# Patient Record
Sex: Male | Born: 1935
Health system: Southern US, Community
[De-identification: ages and names within clinical notes are randomized; demographics above are authoritative.]

## PROBLEM LIST (undated history)

## (undated) ENCOUNTER — Emergency Department (HOSPITAL_COMMUNITY): Payer: Medicare Other | Source: Home / Self Care

## (undated) DIAGNOSIS — Z9889 Other specified postprocedural states: Secondary | ICD-10-CM

## (undated) DIAGNOSIS — F329 Major depressive disorder, single episode, unspecified: Secondary | ICD-10-CM

## (undated) DIAGNOSIS — F32A Depression, unspecified: Secondary | ICD-10-CM

## (undated) DIAGNOSIS — F419 Anxiety disorder, unspecified: Secondary | ICD-10-CM

## (undated) DIAGNOSIS — H9190 Unspecified hearing loss, unspecified ear: Secondary | ICD-10-CM

## (undated) DIAGNOSIS — J449 Chronic obstructive pulmonary disease, unspecified: Secondary | ICD-10-CM

## (undated) DIAGNOSIS — I10 Essential (primary) hypertension: Secondary | ICD-10-CM

## (undated) DIAGNOSIS — N400 Enlarged prostate without lower urinary tract symptoms: Secondary | ICD-10-CM

## (undated) DIAGNOSIS — K219 Gastro-esophageal reflux disease without esophagitis: Secondary | ICD-10-CM

## (undated) DIAGNOSIS — R112 Nausea with vomiting, unspecified: Secondary | ICD-10-CM

## (undated) DIAGNOSIS — M1990 Unspecified osteoarthritis, unspecified site: Secondary | ICD-10-CM

## (undated) HISTORY — PX: HERNIA REPAIR: SHX51

## (undated) HISTORY — PX: REVISION TOTAL HIP ARTHROPLASTY: SHX766

## (undated) HISTORY — PX: PARTIAL HIP ARTHROPLASTY: SHX733

## (undated) HISTORY — PX: COMPRESSION HIP SCREW: SHX1386

---

## 2004-02-08 ENCOUNTER — Emergency Department (HOSPITAL_COMMUNITY): Admission: EM | Admit: 2004-02-08 | Discharge: 2004-02-08 | Payer: Self-pay | Admitting: Emergency Medicine

## 2005-03-08 ENCOUNTER — Emergency Department (HOSPITAL_COMMUNITY): Admission: EM | Admit: 2005-03-08 | Discharge: 2005-03-08 | Payer: Self-pay | Admitting: Emergency Medicine

## 2005-03-22 ENCOUNTER — Ambulatory Visit: Payer: Self-pay | Admitting: Cardiology

## 2007-09-21 ENCOUNTER — Emergency Department (HOSPITAL_COMMUNITY): Admission: EM | Admit: 2007-09-21 | Discharge: 2007-09-21 | Payer: Self-pay | Admitting: Emergency Medicine

## 2008-08-09 ENCOUNTER — Ambulatory Visit (HOSPITAL_COMMUNITY): Admission: RE | Admit: 2008-08-09 | Discharge: 2008-08-09 | Payer: Self-pay | Admitting: Occupational Medicine

## 2009-09-22 ENCOUNTER — Ambulatory Visit (HOSPITAL_COMMUNITY): Admission: RE | Admit: 2009-09-22 | Discharge: 2009-09-22 | Payer: Self-pay | Admitting: Infectious Diseases

## 2011-07-08 ENCOUNTER — Other Ambulatory Visit: Payer: Self-pay

## 2011-07-08 ENCOUNTER — Encounter (HOSPITAL_COMMUNITY): Payer: Self-pay

## 2011-07-08 ENCOUNTER — Encounter (HOSPITAL_COMMUNITY)
Admission: RE | Admit: 2011-07-08 | Discharge: 2011-07-08 | Disposition: A | Discharge status: Home / Self Care | Payer: 59 | Source: Ambulatory Visit | Attending: Urology | Admitting: Urology

## 2011-07-08 HISTORY — DX: Essential (primary) hypertension: I10

## 2011-07-08 HISTORY — DX: Chronic obstructive pulmonary disease, unspecified: J44.9

## 2011-07-08 HISTORY — DX: Unspecified hearing loss, unspecified ear: H91.90

## 2011-07-08 HISTORY — DX: Gastro-esophageal reflux disease without esophagitis: K21.9

## 2011-07-08 HISTORY — DX: Major depressive disorder, single episode, unspecified: F32.9

## 2011-07-08 HISTORY — DX: Nausea with vomiting, unspecified: Z98.890

## 2011-07-08 HISTORY — DX: Unspecified osteoarthritis, unspecified site: M19.90

## 2011-07-08 HISTORY — DX: Anxiety disorder, unspecified: F41.9

## 2011-07-08 HISTORY — DX: Benign prostatic hyperplasia without lower urinary tract symptoms: N40.0

## 2011-07-08 HISTORY — DX: Depression, unspecified: F32.A

## 2011-07-08 HISTORY — DX: Nausea with vomiting, unspecified: R11.2

## 2011-07-08 LAB — BASIC METABOLIC PANEL
BUN: 15 mg/dL (ref 6–23)
Calcium: 9.1 mg/dL (ref 8.4–10.5)
GFR calc Af Amer: >60 mL/min (ref >60)

## 2011-07-08 LAB — CBC
MCV: 86.5 fL (ref 78.0–100.0)
RBC: 4.31 MIL/uL (ref 4.22–5.81)

## 2011-07-08 NOTE — Patient Instructions (Addendum)
20 BILLYJACK TROMPETER  07/08/2011   Your procedure is scheduled on:  07/09/2011  Report to [facility] at  940  AM.  Call this number if you have problems the morning of surgery: [phone]   Remember:   Do not eat food:After Midnight.  Do not drink clear liquids: After Midnight.  Take these medicines the morning of surgery with A SIP OF WATER: none   Do not wear jewelry, make-up or nail polish.  Do not wear lotions, powders, or perfumes. You may wear deodorant.  Do not shave 48 hours prior to surgery.  Do not bring valuables to the hospital.  Contacts, dentures or bridgework may not be worn into surgery.  Leave suitcase in the car. After surgery it may be brought to your room.  For patients admitted to the hospital, checkout time is 11:00 AM the day of discharge.   Patients discharged the day of surgery will not be allowed to drive home.  Name and phone number of your driver: family  Special Instructions: CHG Shower Use Special Wash: 1/2 bottle night before surgery and 1/2 bottle morning of surgery.   Please read over the following fact sheets that you were given: Pain Booklet, MRSA Information, Surgical Site Infection Prevention, Anesthesia Post-op Instructions and Care and Recovery After Surgery PATIENT INSTRUCTIONS POST-ANESTHESIA  IMMEDIATELY FOLLOWING SURGERY:  Do not drive or operate machinery for the first twenty four hours after surgery.  Do not make any important decisions for twenty four hours after surgery or while taking narcotic pain medications or sedatives.  If you develop intractable nausea and vomiting or a severe headache please notify your doctor immediately.  FOLLOW-UP:  Please make an appointment with your surgeon as instructed. You do not need to follow up with anesthesia unless specifically instructed to do so.  WOUND CARE INSTRUCTIONS (if applicable):  Keep a dry clean dressing on the anesthesia/puncture wound site if there is drainage.  Once the wound has quit draining  you may leave it open to air.  Generally you should leave the bandage intact for twenty four hours unless there is drainage.  If the epidural site drains for more than 36-48 hours please call the anesthesia department.  QUESTIONS?:  Please feel free to call your physician or the hospital operator if you have any questions, and they will be happy to assist you.     [facility] Anesthesia Department [address] [place] [phone]

## 2011-07-08 NOTE — H&P (Signed)
NAMEDICK, HARK                 ACCOUNT NO.:  [id number]  MEDICAL RECORD NO.:  [id number]  LOCATION:                                FACILITY:  [facility]  PHYSICIAN:  Ky Barban, M.D.DATE OF BIRTH:  08/07/1935  DATE OF ADMISSION:  07/08/2011 DATE OF DISCHARGE:  LH                             HISTORY & PHYSICAL   CHIEF COMPLAINT:  Symptoms of prostatism.  HISTORY:  Christian Miller is a 75 year old gentleman well-known to me.  I am following him since 1993 and initially, he was suffering from chronic prostatitis.  At that time, he was found to have urethral stricture which I have dilated.  He has not any recurrence of that stricture. Then in 2003, he was having symptoms of prostatism.  Workup showed that he has enlarged prostate.  He underwent holmium laser ablation of the prostate.  He has done well with a urinary stream but he continues to have urgency, frequency, and lately, however, last month he developed an episode of urinary tract infection and gross hematuria.  He developed sepsis and was admitted to [facility] and treated and then I cystoscoped again this time in the office last week.  He was found to have regrowth of his adenoma causing bladder neck obstruction.  His residual urine was 175 mL, so I have recommended that we go ahead and resect his prostate for which he is coming as outpatient.  He also has developed erectile dysfunction, but he does okay with Viagra.  His other problem is that he had an elevated PSA in 2007, underwent prostate biopsy, and there was one biopsy that showed suspicion of adenocarcinoma but subsequently it was completely clear.  There is no cancer in his prostate.  PAST MEDICAL HISTORY:  He denies any chest pain, orthopnea, PND, nausea, or vomiting.  No history of hypertension or diabetes.  PERSONAL HISTORY:  He does not smoke or drink.  He is married and lives with his wife.  REVIEW OF SYSTEMS:  Denies any chest pain, orthopnea,  PND, nausea, or vomiting.  PHYSICAL EXAMINATION:  GENERAL:  Moderately built, not in acute distress, fully conscious, alert, and oriented. VITAL SIGNS:  Blood pressure 130/80, temperature is normal. CENTRAL NERVOUS SYSTEM:  No gross neurological deficit. HEAD/NECK/ENT:  Negative. CHEST:  Symmetrical. HEART:  Regular sinus rhythm.  No murmur. ABDOMEN:  Soft, flat.  Liver, spleen, kidneys not palpable. BACK:  No CVA tenderness. EXTERNAL GENITALIA:  He has circumcised meatus and adequate.  Testicles are normal. RECTAL:  Prostate 1.5+ smooth and firm.  IMPRESSION: 1. Benign prostatic hypertrophy with bladder neck obstruction. 2. Depression. 3. Gastroesophageal reflux. 4. Osteoarthritis. 5. Diverticulosis. 6. He looks like he also has hypertension, although several blood     pressures I have taken in the office look normal.  PLAN:  TUR prostate under anesthesia as outpatient.  Procedure limitation and complication discussed.     Ky Barban, M.D.     MIJ/MEDQ  D:  07/08/2011  T:  07/08/2011  Job:  [id number]

## 2011-07-09 ENCOUNTER — Encounter (HOSPITAL_COMMUNITY)
Admission: RE | Disposition: A | Discharge status: Home / Self Care | Payer: Self-pay | Source: Ambulatory Visit | Attending: Urology

## 2011-07-09 ENCOUNTER — Other Ambulatory Visit: Payer: Self-pay | Admitting: Urology

## 2011-07-09 ENCOUNTER — Encounter (HOSPITAL_COMMUNITY): Payer: Self-pay | Admitting: Anesthesiology

## 2011-07-09 ENCOUNTER — Ambulatory Visit (HOSPITAL_COMMUNITY): Payer: 59 | Admitting: Anesthesiology

## 2011-07-09 ENCOUNTER — Ambulatory Visit (HOSPITAL_COMMUNITY)
Admission: RE | Admit: 2011-07-09 | Discharge: 2011-07-10 | Disposition: A | Discharge status: Home / Self Care | Payer: 59 | Source: Ambulatory Visit | Attending: Urology | Admitting: Urology

## 2011-07-09 ENCOUNTER — Encounter (HOSPITAL_COMMUNITY): Payer: Self-pay | Admitting: *Deleted

## 2011-07-09 DIAGNOSIS — J449 Chronic obstructive pulmonary disease, unspecified: Secondary | ICD-10-CM | POA: Insufficient documentation

## 2011-07-09 DIAGNOSIS — N400 Enlarged prostate without lower urinary tract symptoms: Secondary | ICD-10-CM | POA: Insufficient documentation

## 2011-07-09 DIAGNOSIS — I10 Essential (primary) hypertension: Secondary | ICD-10-CM | POA: Insufficient documentation

## 2011-07-09 DIAGNOSIS — Z01812 Encounter for preprocedural laboratory examination: Secondary | ICD-10-CM | POA: Insufficient documentation

## 2011-07-09 DIAGNOSIS — Z01810 Encounter for preprocedural cardiovascular examination: Secondary | ICD-10-CM | POA: Insufficient documentation

## 2011-07-09 DIAGNOSIS — J4489 Other specified chronic obstructive pulmonary disease: Secondary | ICD-10-CM | POA: Insufficient documentation

## 2011-07-09 HISTORY — PX: TRANSURETHRAL RESECTION OF PROSTATE: SHX73

## 2011-07-09 SURGERY — TURP (TRANSURETHRAL RESECTION OF PROSTATE)
Anesthesia: Spinal | Wound class: Clean Contaminated

## 2011-07-09 MED ORDER — FENTANYL CITRATE 0.05 MG/ML IJ SOLN
INTRAMUSCULAR | Status: DC | PRN
  Administered 2011-07-09: 25 ug via INTRATHECAL

## 2011-07-09 MED ORDER — FENTANYL CITRATE 0.05 MG/ML IJ SOLN
INTRAMUSCULAR | Status: DC | PRN
  Administered 2011-07-09: 25 ug via INTRAVENOUS
  Administered 2011-07-09: 50 ug via INTRAVENOUS

## 2011-07-09 MED ORDER — EPHEDRINE SULFATE 50 MG/ML IJ SOLN
INTRAMUSCULAR | Status: AC
  Filled 2011-07-09: qty 1

## 2011-07-09 MED ORDER — CIPROFLOXACIN IN D5W 200 MG/100ML IV SOLN
200.0000 mg | INTRAVENOUS | Status: AC
  Administered 2011-07-10: 200 mg via INTRAVENOUS
  Filled 2011-07-09: qty 100

## 2011-07-09 MED ORDER — LIDOCAINE HCL (PF) 1 % IJ SOLN
INTRAMUSCULAR | Status: AC
  Filled 2011-07-09: qty 5

## 2011-07-09 MED ORDER — MIDAZOLAM HCL 2 MG/2ML IJ SOLN
1.0000 mg | INTRAMUSCULAR | Status: DC | PRN
  Administered 2011-07-09: 2 mg via INTRAVENOUS

## 2011-07-09 MED ORDER — DIAZEPAM 5 MG PO TABS
5.0000 mg | ORAL_TABLET | Freq: Two times a day (BID) | ORAL | Status: DC
  Administered 2011-07-09 – 2011-07-10 (×2): 5 mg via ORAL
  Filled 2011-07-09 (×3): qty 1

## 2011-07-09 MED ORDER — SERTRALINE HCL 50 MG PO TABS
100.0000 mg | ORAL_TABLET | Freq: Every day | ORAL | Status: DC
  Filled 2011-07-09: qty 2

## 2011-07-09 MED ORDER — LACTATED RINGERS IV SOLN
INTRAVENOUS | Status: DC
  Administered 2011-07-09: 11:00:00 via INTRAVENOUS
  Administered 2011-07-09: 50 mL/h via INTRAVENOUS

## 2011-07-09 MED ORDER — PROPOFOL 10 MG/ML IV EMUL
INTRAVENOUS | Status: DC | PRN
  Administered 2011-07-09: 35 ug/kg/min via INTRAVENOUS

## 2011-07-09 MED ORDER — SERTRALINE HCL 50 MG PO TABS
100.0000 mg | ORAL_TABLET | Freq: Every day | ORAL | Status: DC
  Administered 2011-07-09: 100 mg via ORAL
  Filled 2011-07-09: qty 2

## 2011-07-09 MED ORDER — METOCLOPRAMIDE HCL 5 MG/ML IJ SOLN
INTRAMUSCULAR | Status: DC | PRN
  Administered 2011-07-09: 10 mg via INTRAVENOUS

## 2011-07-09 MED ORDER — BUPIVACAINE HCL 0.75 % IJ SOLN
INTRAMUSCULAR | Status: DC | PRN
  Administered 2011-07-09: 12 mg via INTRATHECAL

## 2011-07-09 MED ORDER — FENTANYL CITRATE 0.05 MG/ML IJ SOLN: 25.0000 ug | INTRAMUSCULAR | Status: DC | PRN

## 2011-07-09 MED ORDER — THERA M PLUS PO TABS
1.0000 | ORAL_TABLET | Freq: Every day | ORAL | Status: DC
  Administered 2011-07-09 – 2011-07-10 (×2): 1 via ORAL
  Filled 2011-07-09 (×2): qty 1

## 2011-07-09 MED ORDER — MIDAZOLAM HCL 5 MG/5ML IJ SOLN
INTRAMUSCULAR | Status: DC | PRN
  Administered 2011-07-09: 2 mg via INTRAVENOUS

## 2011-07-09 MED ORDER — DEXTROSE-NACL 5-0.45 % IV SOLN
INTRAVENOUS | Status: DC
  Administered 2011-07-09 – 2011-07-10 (×3): via INTRAVENOUS

## 2011-07-09 MED ORDER — FENTANYL CITRATE 0.05 MG/ML IJ SOLN
INTRAMUSCULAR | Status: AC
  Filled 2011-07-09: qty 2

## 2011-07-09 MED ORDER — CIPROFLOXACIN IN D5W 200 MG/100ML IV SOLN
INTRAVENOUS | Status: AC
  Administered 2011-07-09: 200 mg via INTRAVENOUS
  Filled 2011-07-09: qty 100

## 2011-07-09 MED ORDER — GLYCINE 1.5 % IR SOLN
Status: DC | PRN
  Administered 2011-07-09: 3000 mL

## 2011-07-09 MED ORDER — ACETAMINOPHEN 500 MG PO TABS: 500.0000 mg | ORAL_TABLET | Freq: Four times a day (QID) | ORAL | Status: DC | PRN

## 2011-07-09 MED ORDER — LOSARTAN POTASSIUM 50 MG PO TABS
100.0000 mg | ORAL_TABLET | Freq: Every day | ORAL | Status: DC
  Administered 2011-07-10: 100 mg via ORAL
  Filled 2011-07-09 (×2): qty 2

## 2011-07-09 MED ORDER — MIDAZOLAM HCL 2 MG/2ML IJ SOLN
INTRAMUSCULAR | Status: AC
  Filled 2011-07-09: qty 2

## 2011-07-09 MED ORDER — PROPOFOL 10 MG/ML IV EMUL
INTRAVENOUS | Status: AC
  Filled 2011-07-09: qty 20

## 2011-07-09 MED ORDER — BUPIVACAINE IN DEXTROSE 0.75-8.25 % IT SOLN
INTRATHECAL | Status: AC
  Filled 2011-07-09: qty 2

## 2011-07-09 MED ORDER — STERILE WATER FOR IRRIGATION IR SOLN
Status: DC | PRN
  Administered 2011-07-09: 1000 mL

## 2011-07-09 MED ORDER — METOCLOPRAMIDE HCL 5 MG/ML IJ SOLN
INTRAMUSCULAR | Status: AC
  Filled 2011-07-09: qty 2

## 2011-07-09 MED ORDER — PANTOPRAZOLE SODIUM 40 MG PO TBEC
40.0000 mg | DELAYED_RELEASE_TABLET | Freq: Every day | ORAL | Status: DC
  Administered 2011-07-10: 40 mg via ORAL
  Filled 2011-07-09 (×2): qty 1

## 2011-07-09 MED ORDER — MORPHINE SULFATE 2 MG/ML IJ SOLN: 2.0000 mg | INTRAMUSCULAR | Status: DC | PRN

## 2011-07-09 MED ORDER — ONDANSETRON HCL 4 MG/2ML IJ SOLN: 4.0000 mg | Freq: Once | INTRAMUSCULAR | Status: DC | PRN

## 2011-07-09 SURGICAL SUPPLY — 33 items
BAG DECANTER FOR FLEXI CONT (MISCELLANEOUS) ×2 IMPLANT
BAG DRAIN URO TABLE W/ADPT NS (DRAPE) ×2 IMPLANT
BAG URINE DRAIN TURP 4L (OSTOMY) ×2 IMPLANT
CABLE HI FREQUENCY MONOPOLAR (ELECTROSURGICAL) ×2 IMPLANT
CATH FOLEY 3WAY 30CC 22F (CATHETERS) ×2 IMPLANT
CLOTH BEACON ORANGE TIMEOUT ST (SAFETY) ×2 IMPLANT
CONNECTOR 5 IN 1 STRAIGHT STRL (MISCELLANEOUS) ×2 IMPLANT
DRAPE STERI URO 23X35 APER SZ5 (DRAPE) ×2 IMPLANT
ELECT CUT LOOP C-MAX 27FR .012 (CUTTING LOOP)
ELECT REM PT RETURN 9FT ADLT (ELECTROSURGICAL) ×2
ELECTRODE CUT LP CMX 27FR .012 (CUTTING LOOP) IMPLANT
ELECTRODE REM PT RTRN 9FT ADLT (ELECTROSURGICAL) ×1 IMPLANT
FLOOR PAD 36X40 (MISCELLANEOUS)
FORMALIN 10 PREFIL 480ML (MISCELLANEOUS) ×2 IMPLANT
GLOVE BIO SURGEON STRL SZ7 (GLOVE) ×2 IMPLANT
GLOVE BIOGEL PI IND STRL 7.5 (GLOVE) ×1 IMPLANT
GLOVE BIOGEL PI INDICATOR 7.5 (GLOVE) ×1
GLOVE ECLIPSE 7.0 STRL STRAW (GLOVE) ×2 IMPLANT
GLOVE EXAM NITRILE MD LF STRL (GLOVE) ×2 IMPLANT
GLYCINE 1.5% IRRIG UROMATIC (IV SOLUTION) ×12 IMPLANT
GOWN BRE IMP SLV AUR XL STRL (GOWN DISPOSABLE) ×2 IMPLANT
IV NS IRRIG 3000ML ARTHROMATIC (IV SOLUTION) IMPLANT
KIT ROOM TURNOVER AP CYSTO (KITS) ×2 IMPLANT
MANIFOLD NEPTUNE II (INSTRUMENTS) ×2 IMPLANT
PACK CYSTO (CUSTOM PROCEDURE TRAY) ×2 IMPLANT
PAD ARMBOARD 7.5X6 YLW CONV (MISCELLANEOUS) ×2 IMPLANT
PAD FLOOR 36X40 (MISCELLANEOUS) IMPLANT
SET IRRIGATING DISP (SET/KITS/TRAYS/PACK) ×2 IMPLANT
SYR 30ML LL (SYRINGE) ×2 IMPLANT
TOWEL OR 17X26 4PK STRL BLUE (TOWEL DISPOSABLE) ×2 IMPLANT
WATER STERILE IRR 1000ML POUR (IV SOLUTION) ×2 IMPLANT
XPEEDA 550 SIDEFIRING FIBER (MISCELLANEOUS) IMPLANT
YANKAUER SUCT BULB TIP 10FT TU (MISCELLANEOUS) ×2 IMPLANT

## 2011-07-09 NOTE — Transfer of Care (Signed)
Immediate Anesthesia Transfer of Care Note  Patient: Christian Miller  Procedure(s) Performed:  TRANSURETHRAL RESECTION OF THE PROSTATE (TURP)  Patient Location: PACU  Anesthesia Type: Spinal  Level of Consciousness: awake, alert  and oriented  Airway & Oxygen Therapy: Patient Spontanous Breathing and Patient connected to nasal cannula oxygen  Post-op Assessment: Report given to PACU RN, Post -op Vital signs reviewed and stable and Patient moving all extremities  Post vital signs: Reviewed and stable  Complications: No apparent anesthesia complications   AND MOVING LEGS WELL. COMFORTABLE> HOB ^ FOR REFLUX S+Sx

## 2011-07-09 NOTE — Anesthesia Preprocedure Evaluation (Addendum)
Anesthesia Evaluation  Name, MR# and DOB Patient awake  General Assessment Comment  Reviewed: Allergy & Precautions, H&P , NPO status , Patient's Chart, lab work & pertinent test results  History of Anesthesia Complications (+) PONV  Airway Mallampati: II TM Distance: >3 FB     Dental  (+) Partial Upper and Implants   Pulmonary   COPD inhaler    pulmonary exam normalPulmonary Exam Normal     Cardiovascular hypertension, Pt. on medications Regular Normal    Neuro/Psych    (+) Anxiety, Depression,    GI/Hepatic/Renal        GERD Medicated and Controlled     Endo/Other    Abdominal   Musculoskeletal   Hematology   Peds  Reproductive/Obstetrics    Anesthesia Other Findings            Anesthesia Physical Anesthesia Plan  ASA: III  Anesthesia Plan: Spinal   Post-op Pain Management:    Induction: Intravenous  Airway Management Planned: Nasal Cannula  Additional Equipment:   Intra-op Plan:   Post-operative Plan:   Informed Consent: I have reviewed the patients History and Physical, chart, labs and discussed the procedure including the risks, benefits and alternatives for the proposed anesthesia with the patient or authorized representative who has indicated his/her understanding and acceptance.     Plan Discussed with:   Anesthesia Plan Comments:         Anesthesia Quick Evaluation

## 2011-07-09 NOTE — Brief Op Note (Signed)
No change in  H& P

## 2011-07-09 NOTE — Op Note (Signed)
NAMECAETANO, OBERHAUS                 ACCOUNT NO.:  [id number]  MEDICAL RECORD NO.:  [id number]  LOCATION:  [facility]                          FACILITY:  [facility]  PHYSICIAN:  Ky Barban, M.D.DATE OF BIRTH:  12/20/1934  DATE OF PROCEDURE:  07/09/2011 DATE OF DISCHARGE:                              OPERATIVE REPORT   PREOPERATIVE DIAGNOSIS:  Benign prostatic hypertrophy.  POSTOPERATIVE DIAGNOSIS:  Benign prostatic hypertrophy.  PROCEDURE:  TUR prostate.  ANESTHESIA:  Spinal.  PROCEDURE:  The patient under spinal anesthesia in lithotomy position and usual prep and drape, #28 Iglesias resectoscope was introduced into the bladder, was inspected again.  He has very large left lobe, which was going across the midline.  It was resected completely to the level of the verumontanum and there was some tissue in the right lobe, which was resected next. After resecting the left lobe and obstructive tissue of the right lobe, the chips were evacuated.  Bleeders were coagulated. At this point, the prostatic urethra looks wide open and resectoscope was removed.  A 22 three-way Foley catheter left in for drainage.  CBI started which is clear.  The patient left the operating room in satisfactory condition.     Ky Barban, M.D.     MIJ/MEDQ  D:  07/09/2011  T:  07/09/2011  Job:  [id number]

## 2011-07-09 NOTE — Anesthesia Procedure Notes (Addendum)
Spinal Block  Patient location during procedure: OR Start time: 07/09/2011 10:19 AM Staffing CRNA/Resident: Marylene Buerger Preanesthetic Checklist Completed: patient identified and IV checked Spinal Block Patient position: left lateral decubitus Prep: Betadine  Spinal Block  End time: 07/09/2011 10:26 AM Spinal Block Patient position: left lateral decubitus Prep: Betadine Patient monitoring: heart rate Approach: left paramedian Location: L3-4 Injection technique: single-shot Needle Needle type: Spinocan  Needle gauge: 22 G Needle length: 9 cm Assessment Sensory level: T6 Additional Notes [id number] 2012  -  10 Marcaine  12Mg  Fentanyl  25  Mcg

## 2011-07-09 NOTE — Progress Notes (Addendum)
I stat performed per MD order. Pt's previous lab work showed K+ 5.4. Now within normal limits @ 4.4 (K+)

## 2011-07-09 NOTE — Op Note (Signed)
[phone] [id number]

## 2011-07-10 LAB — BASIC METABOLIC PANEL
CO2: 28 mEq/L (ref 19–32)
Calcium: 8.6 mg/dL (ref 8.4–10.5)
GFR calc Af Amer: >60 mL/min (ref >60)
GFR calc non Af Amer: >60 mL/min (ref >60)
Sodium: 137 mEq/L (ref 135–145)

## 2011-07-10 LAB — CBC
Platelets: 199 10*3/uL (ref 150–400)
RBC: 4.05 MIL/uL — ABNORMAL LOW (ref 4.22–5.81)
WBC: 10.3 10*3/uL (ref 4.0–10.5)

## 2011-07-10 LAB — POCT I-STAT 4, (NA,K, GLUC, HGB,HCT)
HCT: 40 % (ref 39.0–52.0)
Hemoglobin: 13.6 g/dL (ref 13.0–17.0)

## 2011-07-10 LAB — URINE CULTURE
Colony Count: NO GROWTH
Culture  Setup Time: 201209251340

## 2011-07-10 NOTE — Anesthesia Postprocedure Evaluation (Signed)
  Anesthesia Post-op Note  Patient: Christian Miller  Procedure(s) Performed:  TRANSURETHRAL RESECTION OF THE PROSTATE (TURP)  Patient Location:  Room 300  Anesthesia Type: Spinal  Level of Consciousness: awake, alert  and oriented  Airway and Oxygen Therapy: Patient Spontanous Breathing  Post-op Pain: none  Post-op Assessment: Post-op Vital signs reviewed, Patient's Cardiovascular Status Stable and Respiratory Function Stable  Post-op Vital Signs: Reviewed and stable  Complications: No apparent anesthesia complications

## 2011-07-12 ENCOUNTER — Encounter (HOSPITAL_COMMUNITY): Payer: Self-pay | Admitting: Urology

## 2011-07-22 LAB — URINALYSIS, ROUTINE W REFLEX MICROSCOPIC
Nitrite: NEGATIVE
Specific Gravity, Urine: 1.01
Urobilinogen, UA: 0.2

## 2011-07-22 LAB — DIFFERENTIAL
Eosinophils Absolute: 0.4
Eosinophils Relative: 3
Lymphs Abs: 1.7
Monocytes Absolute: 0.8

## 2011-07-22 LAB — COMPREHENSIVE METABOLIC PANEL
ALT: 14
AST: 18
Albumin: 3.8
CO2: 30
Calcium: 9
Chloride: 104
GFR calc Af Amer: >60
GFR calc non Af Amer: >60
Sodium: 143
Total Bilirubin: 0.3

## 2011-07-22 LAB — CBC
RBC: 4.68
WBC: 11.9 — ABNORMAL HIGH

## 2011-10-29 NOTE — Discharge Summary (Signed)
[phone]

## 2011-10-30 NOTE — Discharge Summary (Signed)
NAME:  Christian Miller, Christian Miller NO.:  [id number]  MEDICAL RECORD NO.:  [id number]  LOCATION:                                 FACILITY:  PHYSICIAN:  Ky Barban, M.D.DATE OF BIRTH:  08-Dec-1934  DATE OF ADMISSION: DATE OF DISCHARGE:  LH                              DISCHARGE SUMMARY   This 77 year old gentleman that is well known to me and he is being followed since 1993.  He was suffering from chronic prostatitis.  Also found to have a urethral stricture which I have dilated in the past. Recently, he had positive urine culture.  He was sick, found to have urinary tract infection, with enlarged prostate.  In the past, I have done holmium laser ablation of the prostate, but he has regrowth of the adenoma.  His main complaint his urgency, frequency, and he developed urinary tract infection with gross hematuria, was admitted in [facility] by family physician.  He comes back here and he has large residual urine, that is 175 mL.  I have recommended that he undergo TUR prostate for which he was brought as outpatient, after having routine workup CBC, urinalysis, BMET is normal, and as outpatient I did a TUR prostate.  I also want to mention that he had elevated PSA in 2007, underwent prostate biopsy which was negative.  On exam, it showed suspicion of adenocarcinoma and basically I got the impression that he does not have any prostate cancer.  He has no history of diabetes or hypertension.  He was admitted in the hospital, TUR prostate.  After doing TUR prostate and next day his urine is clear so I decided to send him home with a Foley catheter.  He is up and walking around, and his pathology report is pending.  On that day, the day of discharge and I will see him back in the office on Monday, so I can take the catheter out.  FINAL DISCHARGE DIAGNOSIS:  Benign prostatic hypertrophy.  DISCHARGE CONDITION:  Improved.  DISCHARGE MEDICATIONS:  He is advised to  continue his usual medicine.  I will see him back in office on Monday.     Ky Barban, M.D.     MIJ/MEDQ  D:  10/29/2011  T:  10/30/2011  Job:  [id number]

## 2012-12-06 ENCOUNTER — Other Ambulatory Visit: Payer: Self-pay

## 2012-12-06 ENCOUNTER — Observation Stay (HOSPITAL_COMMUNITY)
Admission: EM | Admit: 2012-12-06 | Discharge: 2012-12-07 | Disposition: A | Discharge status: Home / Self Care | Payer: 59 | Attending: Internal Medicine | Admitting: Internal Medicine

## 2012-12-06 ENCOUNTER — Emergency Department (HOSPITAL_COMMUNITY): Payer: 59

## 2012-12-06 ENCOUNTER — Encounter (HOSPITAL_COMMUNITY): Payer: Self-pay

## 2012-12-06 ENCOUNTER — Observation Stay (HOSPITAL_COMMUNITY): Payer: 59

## 2012-12-06 DIAGNOSIS — E785 Hyperlipidemia, unspecified: Secondary | ICD-10-CM | POA: Insufficient documentation

## 2012-12-06 DIAGNOSIS — R001 Bradycardia, unspecified: Secondary | ICD-10-CM

## 2012-12-06 DIAGNOSIS — R42 Dizziness and giddiness: Secondary | ICD-10-CM

## 2012-12-06 DIAGNOSIS — E871 Hypo-osmolality and hyponatremia: Secondary | ICD-10-CM

## 2012-12-06 DIAGNOSIS — I10 Essential (primary) hypertension: Secondary | ICD-10-CM

## 2012-12-06 DIAGNOSIS — J069 Acute upper respiratory infection, unspecified: Secondary | ICD-10-CM

## 2012-12-06 DIAGNOSIS — R55 Syncope and collapse: Principal | ICD-10-CM

## 2012-12-06 DIAGNOSIS — E860 Dehydration: Secondary | ICD-10-CM

## 2012-12-06 DIAGNOSIS — I498 Other specified cardiac arrhythmias: Secondary | ICD-10-CM | POA: Insufficient documentation

## 2012-12-06 LAB — DIFFERENTIAL
Basophils Absolute: 0 10*3/uL (ref 0.0–0.1)
Basophils Relative: 0 % (ref 0–1)
Eosinophils Absolute: 0.3 10*3/uL (ref 0.0–0.7)
Eosinophils Relative: 3 % (ref 0–5)
Lymphocytes Relative: 17 % (ref 12–46)
Monocytes Absolute: 1.4 10*3/uL — ABNORMAL HIGH (ref 0.1–1.0)

## 2012-12-06 LAB — TSH: TSH: 5.06 u[IU]/mL — ABNORMAL HIGH (ref 0.350–4.500)

## 2012-12-06 LAB — COMPREHENSIVE METABOLIC PANEL
ALT: 13 U/L (ref 0–53)
AST: 20 U/L (ref 0–37)
Albumin: 3.9 g/dL (ref 3.5–5.2)
Calcium: 9.5 mg/dL (ref 8.4–10.5)
Creatinine, Ser: 0.98 mg/dL (ref 0.50–1.35)
Sodium: 130 mEq/L — ABNORMAL LOW (ref 135–145)
Total Protein: 7.5 g/dL (ref 6.0–8.3)

## 2012-12-06 LAB — CBC
MCH: 30.1 pg (ref 26.0–34.0)
MCHC: 33.4 g/dL (ref 30.0–36.0)
MCV: 90.2 fL (ref 78.0–100.0)
Platelets: 236 10*3/uL (ref 150–400)
RDW: 13.1 % (ref 11.5–15.5)
WBC: 10.1 10*3/uL (ref 4.0–10.5)

## 2012-12-06 LAB — URINALYSIS, ROUTINE W REFLEX MICROSCOPIC
Bilirubin Urine: NEGATIVE
Hgb urine dipstick: NEGATIVE
Specific Gravity, Urine: 1.01 (ref 1.005–1.030)
Urobilinogen, UA: 0.2 mg/dL (ref 0.0–1.0)
pH: 6.5 (ref 5.0–8.0)

## 2012-12-06 LAB — MAGNESIUM: Magnesium: 2.1 mg/dL (ref 1.5–2.5)

## 2012-12-06 MED ORDER — ONDANSETRON HCL 4 MG PO TABS: 4.0000 mg | ORAL_TABLET | Freq: Four times a day (QID) | ORAL | Status: DC | PRN

## 2012-12-06 MED ORDER — DIAZEPAM 5 MG PO TABS
2.5000 mg | ORAL_TABLET | Freq: Two times a day (BID) | ORAL | Status: DC
  Administered 2012-12-06 – 2012-12-07 (×3): 2.5 mg via ORAL
  Filled 2012-12-06 (×3): qty 1

## 2012-12-06 MED ORDER — LOSARTAN POTASSIUM 50 MG PO TABS
100.0000 mg | ORAL_TABLET | Freq: Every day | ORAL | Status: DC
  Administered 2012-12-06 – 2012-12-07 (×2): 100 mg via ORAL
  Filled 2012-12-06 (×2): qty 2

## 2012-12-06 MED ORDER — ASPIRIN EC 81 MG PO TBEC
81.0000 mg | DELAYED_RELEASE_TABLET | ORAL | Status: DC
  Administered 2012-12-06: 81 mg via ORAL
  Filled 2012-12-06: qty 1

## 2012-12-06 MED ORDER — ONDANSETRON HCL 4 MG/2ML IJ SOLN: 4.0000 mg | Freq: Four times a day (QID) | INTRAMUSCULAR | Status: DC | PRN

## 2012-12-06 MED ORDER — OXYMETAZOLINE HCL 0.05 % NA SOLN: 1.0000 | Freq: Two times a day (BID) | NASAL | Status: DC | PRN

## 2012-12-06 MED ORDER — HYDROCODONE-ACETAMINOPHEN 5-325 MG PO TABS: 1.0000 | ORAL_TABLET | ORAL | Status: DC | PRN

## 2012-12-06 MED ORDER — ACETAMINOPHEN 325 MG PO TABS: 650.0000 mg | ORAL_TABLET | Freq: Four times a day (QID) | ORAL | Status: DC | PRN

## 2012-12-06 MED ORDER — ALBUTEROL SULFATE (5 MG/ML) 0.5% IN NEBU: 2.5000 mg | INHALATION_SOLUTION | RESPIRATORY_TRACT | Status: DC | PRN

## 2012-12-06 MED ORDER — SALINE SPRAY 0.65 % NA SOLN
1.0000 | NASAL | Status: DC | PRN
  Filled 2012-12-06: qty 44

## 2012-12-06 MED ORDER — SODIUM CHLORIDE 0.9 % IV SOLN
INTRAVENOUS | Status: DC
  Administered 2012-12-06 – 2012-12-07 (×2): via INTRAVENOUS

## 2012-12-06 MED ORDER — POTASSIUM CHLORIDE IN NACL 20-0.9 MEQ/L-% IV SOLN
INTRAVENOUS | Status: DC
  Administered 2012-12-06: 11:00:00 via INTRAVENOUS

## 2012-12-06 MED ORDER — ACETAMINOPHEN 650 MG RE SUPP: 650.0000 mg | Freq: Four times a day (QID) | RECTAL | Status: DC | PRN

## 2012-12-06 MED ORDER — AMOXICILLIN-POT CLAVULANATE 875-125 MG PO TABS
1.0000 | ORAL_TABLET | Freq: Two times a day (BID) | ORAL | Status: DC
  Administered 2012-12-06: 1 via ORAL
  Filled 2012-12-06: qty 1

## 2012-12-06 MED ORDER — GUAIFENESIN-DM 100-10 MG/5ML PO SYRP: 5.0000 mL | ORAL_SOLUTION | ORAL | Status: DC | PRN

## 2012-12-06 MED ORDER — PANTOPRAZOLE SODIUM 40 MG PO TBEC
40.0000 mg | DELAYED_RELEASE_TABLET | Freq: Every day | ORAL | Status: DC
  Administered 2012-12-06 – 2012-12-07 (×2): 40 mg via ORAL
  Filled 2012-12-06 (×2): qty 1

## 2012-12-06 MED ORDER — SENNOSIDES-DOCUSATE SODIUM 8.6-50 MG PO TABS
1.0000 | ORAL_TABLET | Freq: Every day | ORAL | Status: DC
  Administered 2012-12-06: 1 via ORAL
  Filled 2012-12-06: qty 1

## 2012-12-06 MED ORDER — ALUM & MAG HYDROXIDE-SIMETH 200-200-20 MG/5ML PO SUSP: 30.0000 mL | Freq: Four times a day (QID) | ORAL | Status: DC | PRN

## 2012-12-06 MED ORDER — SERTRALINE HCL 50 MG PO TABS
100.0000 mg | ORAL_TABLET | Freq: Every day | ORAL | Status: DC
  Administered 2012-12-06 – 2012-12-07 (×2): 100 mg via ORAL
  Filled 2012-12-06 (×2): qty 2

## 2012-12-06 NOTE — ED Notes (Signed)
Patient not wearing his hearing aid nor did he take his blood pressure meds

## 2012-12-06 NOTE — ED Provider Notes (Signed)
History     CSN: [id number]  Arrival date & time 12/06/12  0611   First MD Initiated Contact with Patient 12/06/12 [phone]      Chief Complaint  Patient presents with  . Dizziness  . Near Syncope     Patient is a 78 y.o. male presenting with weakness. The history is provided by the patient.  Weakness This is a new problem. The current episode started less than 1 hour ago. The problem occurs constantly. The problem has been resolved. Pertinent negatives include no chest pain, no abdominal pain, no headaches and no shortness of breath. Nothing aggravates the symptoms. Nothing relieves the symptoms. He has tried rest for the symptoms. The treatment provided significant relief.  pt reports he woke up feeling well.  On his way to work (he is a Electrical engineer at [employer]) he felt tired and he "might go to sleep" and felt as though he might pass out.   He also reports he felt dizzy and his "equilibrium" was off.  No cp/sob.  No focal weakness.  He is now improved.  He reports recent cough and congestion and has been taking decongestant but no other medicines.    Past Medical History  Diagnosis Date  . PONV (postoperative nausea and vomiting)   . Hypertension   . Anxiety   . Depression   . BPH (benign prostatic hyperplasia)   . GERD (gastroesophageal reflux disease)   . Arthritis   . COPD (chronic obstructive pulmonary disease)   . HOH (hard of hearing)     Past Surgical History  Procedure Laterality Date  . [facility]      [facility]  . [facility]  . Partial [facility]      [facility]  . Hernia repair      [facility]  . Transurethral resection of prostate  07/09/2011    Procedure: TRANSURETHRAL RESECTION OF THE PROSTATE (TURP);  Surgeon: Ky Barban;  Location: [facility];  Service: Urology;  Laterality: N/A;    Family History  Problem Relation Age of Onset  . Anesthesia problems Neg Hx   . Hypotension Neg Hx   .  Malignant hyperthermia Neg Hx   . Pseudochol deficiency Neg Hx     History  Substance Use Topics  . Smoking status: Former Smoker -- 0.50 packs/day for 25 years    Types: Cigarettes    Quit date: 07/08/1991  . Smokeless tobacco: Not on file  . Alcohol Use: No      Review of Systems  Constitutional: Negative for fever.  Respiratory: Negative for shortness of breath.   Cardiovascular: Negative for chest pain.  Gastrointestinal: Negative for vomiting and abdominal pain.  Neurological: Positive for dizziness and weakness. Negative for syncope and headaches.  Psychiatric/Behavioral: Negative for agitation.  All other systems reviewed and are negative.    Allergies  Review of patient's allergies indicates no known allergies.  Home Medications   Current Outpatient Rx  Name  Route  Sig  Dispense  Refill  . acetaminophen (TYLENOL) 500 MG tablet   Oral   Take 1,000 mg by mouth every 6 (six) hours as needed. For back pain          . diazepam (VALIUM) 5 MG tablet   Oral   Take 5 mg by mouth 2 (two) times daily.           . Multiple Vitamins-Minerals (MULTIVITAMINS THER. W/MINERALS) TABS  Oral   Take 1 tablet by mouth daily.           Marland Kitchen losartan (COZAAR) 100 MG tablet   Oral   Take 100 mg by mouth daily.           Marland Kitchen omeprazole (PRILOSEC) 20 MG capsule   Oral   Take 20 mg by mouth 2 (two) times daily.           . sertraline (ZOLOFT) 100 MG tablet   Oral   Take 100 mg by mouth daily.             BP 160/86  Pulse 55  Resp 16  Ht 6' (1.829 m)  Wt 191 lb (86.637 kg)  BMI 25.9 kg/m2  SpO2 98%  Physical Exam CONSTITUTIONAL: Well developed/well nourished HEAD: Normocephalic/atraumatic EYES: EOMI/PERRL, no nystagmus ENMT: Mucous membranes dry NECK: supple no meningeal signs, no bruits SPINE:entire spine nontender ZO:[id number], S1/S2 noted, no murmurs/rubs/gallops noted LUNGS: Lungs are clear to auscultation bilaterally, no apparent  distress ABDOMEN: soft, nontender, no rebound or guarding GU:no cva tenderness NEURO:Awake/alert, facies symmetric, no arm or leg drift is noted Cranial nerves 3/4/5/6/04/21/09/11/12 tested and intact Gait normal No sensory deficit noted NIHSS - 0 EXTREMITIES: pulses normal, full ROM SKIN: warm, color normal PSYCH: no abnormalities of mood noted   ED Course  Procedures  Labs Reviewed  CBC  DIFFERENTIAL  COMPREHENSIVE METABOLIC PANEL  URINALYSIS, ROUTINE W REFLEX MICROSCOPIC  TROPONIN I   6:34 AM Pt presents for abrupt dizziness and feeling faint.  He is currently neurologically intact without focal weakness. tPA in stroke considered but not given due to:  Symptoms resolved  Will obtain CT head/labs and follow closely   7:12 AM Pt reports improvement, but given  Near syncopal episode he would benefit from OBS admission for monitoring and possible echocardiogram D/w dr Sherrie Mustache who will admit to tele  MDM  Nursing notes including past medical history and social history reviewed and considered in documentation Labs/vital reviewed and considered        Date: 12/06/2012 06:07am  Rate: 53  Rhythm: sinus bradycardia  QRS Axis: left   Intervals: normal  ST/T Wave abnormalities: normal  Conduction Disutrbances:none  Narrative Interpretation:   Old EKG Reviewed: changes noted from previous but no acute ST changes, rate is slower on today's ekg    Joya Gaskins, MD 12/06/12 [phone]

## 2012-12-06 NOTE — H&P (Signed)
[facility] History and Physical  Christian Miller RUE:[id number] DOB: 1934/12/15 DOA: 12/06/2012  Referring physician: ED physician, Dr. Bebe Shaggy PCP: Donzetta Sprung, MD  Specialists: Urologist, Dr. Jerre Simon  Chief Complaint: Lightheadedness and "I thought I was going to go to sleep", while driving.  HPI: Christian Miller is a 78 y.o. male with a past medical history significant for hypertension, anxiety, depression, and BPH, who presents to the emergency department this morning with a chief complaint of lightheadedness and feeling as if he was going to pass out. He is a Electrical engineer at [employer]. 2 days ago, he was started on Augmentin and a decongestant for treatment of what sounds to be an upper respiratory infection or acute bronchitis. Several days prior to treatment, he had had nasal congestion, chest congestion, and a productive cough with yellow sputum. He began to feel better. However, last night he went to bed at approximately 11 PM which is late for him. He got up this morning as usual at approximately 3:00 AM. He walked his dog at 4:30 like usual. On the way to work, while he was driving, he "felt funny". While driving, he thought he was going to fall asleep on a couple of occasions. When he arrived to the hospital, he felt dizzy when he got out of the car and felt that he was going to pass out. He has had no recent fever, chills, headache, spinning dizziness, chest pain, nausea, or vomiting. He has had mild chest congestion and shortness of breath as stated.  In the emergency department, he is afebrile and bradycardic with a heart rate ranging from 48-50. His blood pressure is within normal limits. CT scan of his head revealed no acute intracranial findings. His lab data are significant for a serum sodium of 130. He is being admitted for further evaluation and management.   Review of Systems: Positive as stated above. In addition, his review of systems is positive for chronic  urinary frequency, lightheadedness, and arthritic pain. Otherwise, review of systems is negative.  Past Medical History  Diagnosis Date  . PONV (postoperative nausea and vomiting)   . Hypertension   . Anxiety   . Depression   . BPH (benign prostatic hyperplasia)   . GERD (gastroesophageal reflux disease)   . Arthritis   . COPD (chronic obstructive pulmonary disease)   . HOH (hard of hearing)    Past Surgical History  Procedure Laterality Date  . [facility]ression hip screw      [facility]  . [facility]  . Partial hip [facility]plasty      [facility]  . Hernia repair      [facility]  . Transurethral resection of prostate  07/09/2011    Procedure: TRANSURETHRAL RESECTION OF THE PROSTATE (TURP);  Surgeon: Ky Barban;  Location: [facility];  Service: Urology;  Laterality: N/A;   Social History: He is married. He has one son. He is employed as a Electrical engineer at [employer]. He quit smoking approximately 21 years ago after a 12.5 pack year history. He denies alcohol and illicit drug use.  r  No Known Allergies  Family History  Problem Relation Age of Onset  . Anesthesia problems Neg Hx   . Hypotension Neg Hx   . Malignant hyperthermia Neg Hx   . Pseudochol deficiency Neg Hx     Family history continued: The patient's father died of a heart attack. His mother had stomach problems,  but he's not sure what she died of.   Prior to Admission medications   Medication Sig Start Date End Date Taking? Authorizing Provider  acetaminophen (TYLENOL) 500 MG tablet Take 1,000 mg by mouth every 6 (six) hours as needed. For back pain    Yes Historical Provider, MD  diazepam (VALIUM) 5 MG tablet Take 5 mg by mouth 2 (two) times daily.     Yes Historical Provider, MD  Multiple Vitamins-Minerals (MULTIVITAMINS THER. W/MINERALS) TABS Take 1 tablet by mouth daily.     Yes Historical Provider, MD  losartan (COZAAR) 100 MG tablet Take 100 mg by mouth daily.       Historical Provider, MD  omeprazole (PRILOSEC) 20 MG capsule Take 20 mg by mouth 2 (two) times daily.      Historical Provider, MD  sertraline (ZOLOFT) 100 MG tablet Take 100 mg by mouth daily.      Historical Provider, MD   Physical Exam: Filed Vitals:   12/06/12 4540 12/06/12 0654 12/06/12 0700 12/06/12 0800  BP: 126/79 134/66 146/70 158/69  Pulse: 54 50 49 49  TempSrc:      Resp:  18 21 16   Height:      Weight:      SpO2:  98% 99% 97%     General:  Pleasant alert 78 year old Caucasian man sitting up in bed, in no acute distress.  Eyes: Pupils are equal, round, and reactive to light. Extraocular movements are intact. Conjunctivae are clear. Sclerae are white.  ENT: Frontal sinuses mildly tender, no maxillary sinus tenderness or ethmoid sinus tenderness upon palpation. No rhinorrhea, but noted nasal congestion. Tympanic membrane on the right is clear. Tympanic membrane on the left reveals mild scarring. No acute changes. Oropharynx reveals mildly dry mucous membranes. Partial dentures noted. No exudates or erythema.  Neck: Supple, no adenopathy, no thyromegaly, no bruit.  Cardiovascular: S1, S2, with a soft systolic murmur and bradycardia.  Respiratory: Clear to auscultation bilaterally.  Abdomen: Positive bowel sounds, soft, nontender, nondistended.  Skin: Good turgor, no rashes.  Musculoskeletal: No acute hot red joints. Pedal pulses palpable bilaterally. No pedal edema.  Psychiatric: He is alert and oriented. He has a pleasant affect. His speech is clear.  Neurologic: Cranial nerves II through XII are intact. Mild lateral nystagmus. Strength is 5 over 5 of his upper and lower extremities symmetrically. Sensation is grossly intact bilaterally. Cerebellar testing intact with finger to nose bilaterally. No pronator drift. Gait not assessed.  Labs on Admission:  Basic Metabolic Panel:  Recent Labs Lab 12/06/12 0627  NA 130*  K 4.6  CL 92*  CO2 27  GLUCOSE 87  BUN 16   CREATININE 0.98  CALCIUM 9.5   Liver Function Tests:  Recent Labs Lab 12/06/12 0627  AST 20  ALT 13  ALKPHOS 89  BILITOT 0.3  PROT 7.5  ALBUMIN 3.9   No results found for this basename: LIPASE, AMYLASE,  in the last 168 hours No results found for this basename: AMMONIA,  in the last 168 hours CBC:  Recent Labs Lab 12/06/12 0627  WBC 10.1  NEUTROABS 6.7  HGB 13.2  HCT 39.5  MCV 90.2  PLT 236   Cardiac Enzymes:  Recent Labs Lab 12/06/12 0627  TROPONINI <0.30    BNP (last 3 results) No results found for this basename: PROBNP,  in the last 8760 hours CBG:  Recent Labs Lab 12/06/12 0644  GLUCAP 119*    Radiological Exams on Admission: Ct Head Wo Contrast  12/06/2012  *RADIOLOGY REPORT*  Clinical Data: Left ear ache.  Dizziness.  Near syncope.  CT HEAD WITHOUT CONTRAST  Technique:  Contiguous axial images were obtained from the base of the skull through the vertex without contrast.  Comparison: None.  Findings: The ventricles and sulci are symmetrical without significant effacement, displacement, or dilatation. No mass effect or midline shift. No abnormal extra-axial fluid collections. The grey-white matter junction is distinct. Basal cisterns are not effaced. No acute intracranial hemorrhage. No depressed skull fractures.  Vascular calcifications.  Visualized mastoid air cells are not opacified.  Mucous membrane thickening in the maxillary antrum.  Opacification of some of the ethmoid air cells.  These are likely to be inflammatory changes.  IMPRESSION: No acute intracranial abnormalities.   Original Report Authenticated By: Burman Nieves, M.D.     EKG: pending  Assessment/Plan Active Problems:   Near syncope   Lightheadedness   Bradycardia   HTN (hypertension)   Hyponatremia   URI (upper respiratory infection)   1. This is a pleasant 78 year old man who presents with presyncopal/near syncopal symptomatology. The differential diagnosis includes sleep  deprivation, hyponatremia, hypovolemia/orthostasis, bradycardia-arrhythmia, and possible medication side effect with decongestant. Neurologically, he is intact, but there was mild to minimum lateral nystagmus on extraocular exam. He will be admitted for 24-48 hour observation and further evaluation.      Plan: 1. Will start IV fluid hydration with normal saline. 2. We'll continue Augmentin but will discontinue the decongestant. 3. Supportive treatment with as needed cough medication, nasal saline, and when necessary Afrin nasal spray. 4. For further evaluation, will check the results of the EKG, we'll order a chest x-ray, we'll order cardiac enzymes, TSH, and carotid ultrasound. 5. We'll check orthostatic vital signs. 6. We'll decrease the diazepam by half initially.     Code Status: Full code Family Communication: No family present Disposition Plan: Anticipate discharge to home in 24-48 hours.  Time spent: One hour  [facility] Pager [phone]  If 7PM-7AM, please contact night-coverage [email or web address] Password [facility] 12/06/2012, 8:15 AM

## 2012-12-06 NOTE — ED Notes (Signed)
Dr Sherrie Mustache at bedside for patient evaluation.

## 2012-12-06 NOTE — ED Notes (Signed)
Felt dizzy this morning and like I was going to pass out per pt.

## 2012-12-06 NOTE — ED Notes (Signed)
Report given to [facility] unit [facility]. Ready to receive patient.

## 2012-12-07 LAB — COMPREHENSIVE METABOLIC PANEL
AST: 13 U/L (ref 0–37)
CO2: 27 mEq/L (ref 19–32)
Calcium: 8.9 mg/dL (ref 8.4–10.5)
Chloride: 102 mEq/L (ref 96–112)
Creatinine, Ser: 0.98 mg/dL (ref 0.50–1.35)
GFR calc Af Amer: >90 mL/min (ref >90)
GFR calc non Af Amer: 78 mL/min — ABNORMAL LOW (ref >90)
Glucose, Bld: 106 mg/dL — ABNORMAL HIGH (ref 70–99)
Total Bilirubin: 0.2 mg/dL — ABNORMAL LOW (ref 0.3–1.2)

## 2012-12-07 LAB — T4, FREE: Free T4: 0.93 ng/dL (ref 0.80–1.80)

## 2012-12-07 LAB — LIPID PANEL
HDL: 28 mg/dL — ABNORMAL LOW (ref >39)
LDL Cholesterol: 62 mg/dL (ref 0–99)
Triglycerides: 82 mg/dL (ref <150)

## 2012-12-07 MED ORDER — SENNOSIDES-DOCUSATE SODIUM 8.6-50 MG PO TABS: 1.0000 | ORAL_TABLET | Freq: Every day | ORAL | Status: DC

## 2012-12-07 MED ORDER — DIAZEPAM 5 MG PO TABS: 2.5000 mg | ORAL_TABLET | Freq: Two times a day (BID) | ORAL | Status: DC

## 2012-12-07 MED ORDER — SALINE SPRAY 0.65 % NA SOLN: 1.0000 | NASAL | Status: DC | PRN

## 2012-12-07 MED ORDER — OXYMETAZOLINE HCL 0.05 % NA SOLN: 1.0000 | Freq: Two times a day (BID) | NASAL | Status: DC | PRN

## 2012-12-07 NOTE — Discharge Summary (Signed)
Physician Discharge Summary  Christian Miller WJX:[id number] DOB: 03/29/1935 DOA: 12/06/2012  PCP: Donzetta Sprung, MD  Admit date: 12/06/2012 Discharge date: 12/07/2012  Time spent: Greater than 30 minutes  Recommendations for Outpatient Follow-up:  1. The patient will followup with cardiology tomorrow for further evaluation and monitoring of bradycardia. 2. The patient's free T4 result is pending.  Discharge Diagnoses:  1. Near syncope/presyncope. Differential diagnoses include dehydration, upper respiratory infection, and sleep deprivation. 2. Sinus bradycardia. 3. Hyponatremia. 4. Upper respiratory infection. 5. Hypertension. Controlled. 6. Mildly elevated TSH of 5.06. Free T4 results pending. 7. Dyslipidemia with low HDL. (Fasting lipid profile revealed a total cholesterol of 106, triglycerides of 82, HDL cholesterol 28, and LDL of 62.)  Discharge Condition: Improved.  Diet recommendation: Regular.  Filed Weights   12/06/12 0610 12/06/12 0846  Weight: 86.637 kg (191 lb) 86.637 kg (191 lb)    History of present illness:  The patient is a 78 year old man with a history significant for hypertension, anxiety, depression, and BPH, who presented to the emergency department on 12/06/2012 with a chief complaint of lightheadedness and a feeling as if he was going to pass out. He is a Electrical engineer at [employer]. Two days ago, he was started on Augmentin and a decongestant for treatment of what sounds to be an upper respiratory infection or acute bronchitis. Several days prior to the treatment, he had nasal congestion, chest congestion, and a productive cough with yellow sputum. He began to feel better. However on the night before admission, he went to bed later than usual at 11:00 PM. He got up as usual at approximately 3:00 AM. On the way to work, while he was driving, he "felt funny". While driving, he thought he was going to fall asleep on a couple of occasions. When he arrived to the  hospital, he felt dizzy when he got out of the car and felt as if he was going to pass out. He had no recent diarrhea, vomiting, fever, chills, headache, spinning dizziness, chest pain, nausea, or vomiting. In the emergency department he was afebrile and bradycardic with a heart rate ranging from 48-50 beats per minute. His blood pressure was within normal limits. CT scan of his head revealed no acute intracranial findings. His lab data were significant for a serum sodium of 130. He was admitted for further evaluation and management.  Hospital Course:  The patient was restarted on most of not all of his chronic medications. However, when his chest x-ray revealed no pneumonia or bronchitis, Mucinex and Augmentin were discontinued. He was treated supportively for his upper respiratory infection with saline nasal spray, Afrin nasal spray, and cough medication. In addition, the dose of diazepam was decreased to half of his usual dose. IV fluids were started for hydration. For further evaluation, a number of studies were ordered. Orthostatic vital signs revealed no orthostatic changes although it was suspected that he may have had an element of orthostatic hypotension. His carotid ultrasound revealed 50% or less stenosis bilaterally. All of his cardiac enzymes were well within normal limits. His magnesium level was within normal limits at 2.1. The results of his fasting lipid profile were dictated above. His TSH was mildly elevated at 5.06. The result of the free T4 was pending at the time of discharge. If it is low, he will be called and Synthroid will be called into the pharmacy for him to start.  The patient remained hemodynamically stable and afebrile. His serum sodium improved to 137.  However, his heart rate did fall into the 40s while he was sleeping, but during the waking hours, his heart rate was maintained in the lower 50s. With ambulation, his heart rate increased to the 70s. Although arrhythmia could be  considered as a possible etiology for his presenting symptomatology, it was less likely. Nevertheless, his heart rate should be monitored or evaluated further in the outpatient setting. Therefore, he was scheduled for an outpatient cardiology appointment tomorrow.   During the hospitalization, the patient had no complaints of dizziness, lightheadedness, or shortness of breath. He remained neurologically intact. He ambulated in the hallway several times prior to discharge, without symptoms. He was instructed to be a little more liberal with his sodium intake given his hyponatremia on admission. He was also instructed to get enough sleep at night and to drink more fluids during the day. He voiced understanding.  Procedures:  None  Consultations:  None  Discharge Exam: Filed Vitals:   12/06/12 2159 12/07/12 0511 12/07/12 0512 12/07/12 0513  BP: 118/69 144/80 150/86 154/84  Pulse: 47 42 44 52  Temp: 97.7 F (36.5 C) 97.5 F (36.4 C)    TempSrc: Oral Oral    Resp: 16 16    Height:      Weight:      SpO2: 99% 93% 96% 96%    General: Pleasant 78 year old sitting up in bed reading the newspaper. No acute distress. Cardiovascular: S1, S2, with a soft systolic murmur and bradycardia. Respiratory: Clear to auscultation bilaterally. Neurologic: He is alert and oriented x3. Cranial nerves II through XII are intact.  Discharge Instructions  Discharge Orders   Future Appointments Provider Department Dept Phone   12/08/2012 10:15 AM Gaylord Shih, MD [facility] [place] [phone]   Future Orders Complete By Expires     Diet general  As directed     Discharge instructions  As directed     Comments:      Avoid sleep deprivation. Get enough rest. You can be a little more liberal with your sodium intake. Avoid dehydration. Substitute Gatorade for one or 2 of your beverages daily. Stand slowly. Dr. Sherrie Mustache will call you with the results of your thyroid test when available.     Increase activity slowly  As directed         Medication List    STOP taking these medications       amoxicillin-clavulanate 875-125 MG per tablet  Commonly known as:  AUGMENTIN     guaiFENesin 600 MG 12 hr tablet  Commonly known as:  MUCINEX      TAKE these medications       acetaminophen 500 MG tablet  Commonly known as:  TYLENOL  Take 1,000 mg by mouth every 6 (six) hours as needed. For back pain     diazepam 5 MG tablet  Commonly known as:  VALIUM  Take 0.5 tablets (2.5 mg total) by mouth 2 (two) times daily.     losartan 100 MG tablet  Commonly known as:  COZAAR  Take 100 mg by mouth daily.     multivitamins ther. w/minerals Tabs  Take 1 tablet by mouth daily.     omeprazole 20 MG capsule  Commonly known as:  PRILOSEC  Take 20 mg by mouth 2 (two) times daily.     oxybutynin 10 MG 24 hr tablet  Commonly known as:  DITROPAN-XL  Take 10 mg by mouth daily.     oxymetazoline 0.05 % nasal spray  Commonly  known as:  AFRIN  Place 1 spray into the nose 2 (two) times daily as needed for congestion.     senna-docusate 8.6-50 MG per tablet  Commonly known as:  Senokot-S  Take 1 tablet by mouth at bedtime.     sertraline 100 MG tablet  Commonly known as:  ZOLOFT  Take 100 mg by mouth daily.     sodium chloride 0.65 % Soln nasal spray  Commonly known as:  OCEAN  Place 1 spray into the nose as needed for congestion.           Follow-up Information   Follow up with [facility] On 12/08/2012. (F/up with Dr. Daleen Squibb cardiologist.)    Contact information:   [address] [place] [address]       Follow up with Donzetta Sprung, MD. Schedule an appointment as soon as possible for a visit in 1 week.   Contact information:   [address] [place] [address] [phone]        The results of significant diagnostics from this hospitalization (including imaging, microbiology, ancillary and laboratory) are listed below for reference.     Significant Diagnostic Studies: Ct Head Wo Contrast  12/06/2012  *RADIOLOGY REPORT*  Clinical Data: Left ear ache.  Dizziness.  Near syncope.  CT HEAD WITHOUT CONTRAST  Technique:  Contiguous axial images were obtained from the base of the skull through the vertex without contrast.  Comparison: None.  Findings: The ventricles and sulci are symmetrical without significant effacement, displacement, or dilatation. No mass effect or midline shift. No abnormal extra-axial fluid collections. The grey-white matter junction is distinct. Basal cisterns are not effaced. No acute intracranial hemorrhage. No depressed skull fractures.  Vascular calcifications.  Visualized mastoid air cells are not opacified.  Mucous membrane thickening in the maxillary antrum.  Opacification of some of the ethmoid air cells.  These are likely to be inflammatory changes.  IMPRESSION: No acute intracranial abnormalities.   Original Report Authenticated By: Burman Nieves, M.D.    US Carotid Duplex Bilateral  12/06/2012  *RADIOLOGY REPORT*  Clinical Data: Syncope and history of tobacco use.  BILATERAL CAROTID DUPLEX ULTRASOUND  Technique: Wallace Cullens scale imaging, color Doppler and duplex ultrasound was performed of bilateral carotid and vertebral arteries in the neck.  Comparison:  None.  Criteria:  Quantification of carotid stenosis is based on velocity parameters that correlate the residual internal carotid diameter with NASCET-based stenosis levels, using the diameter of the distal internal carotid lumen as the denominator for stenosis measurement.  The following velocity measurements were obtained:                   PEAK SYSTOLIC/END DIASTOLIC RIGHT ICA:                        99/25cm/sec CCA:                        09/20cm/sec SYSTOLIC ICA/CCA RATIO:     1.1 DIASTOLIC ICA/CCA RATIO:    1.3 ECA:                        155cm/sec  LEFT ICA:                        64/15cm/sec CCA:  133/16cm/sec SYSTOLIC ICA/CCA RATIO:      0.5 DIASTOLIC ICA/CCA RATIO:    0.9 ECA:                        84cm/sec  Findings:  RIGHT CAROTID ARTERY: A mild amount of partially calcified plaque is present at the level of the common carotid artery and carotid bulb.  A moderate amount of predominately calcified plaque is present in the proximal internal carotid artery.  Estimated right ICA stenosis is less than 50%.  RIGHT VERTEBRAL ARTERY:  Antegrade flow with normal wave form.  LEFT CAROTID ARTERY: A small amount of partially calcified plaque is present at the level of the carotid bulb and proximal ICA. Estimated left ICA stenosis is less than 50%.  LEFT VERTEBRAL ARTERY:  Antegrade flow with normal wave form.  IMPRESSION: Atherosclerotic plaque at both carotid bifurcations, right greater than left, without significant ICA stenosis identified. Categorization of bilateral estimated ICA stenoses are less than 50%.   Original Report Authenticated By: Irish Lack, M.D.    Dg Chest Port 1 View  12/06/2012  *RADIOLOGY REPORT*  Clinical Data: Chest congestion and dizziness.  PORTABLE CHEST - 1 VIEW  Comparison: 09/22/2009  Findings: The lungs are clear and show no evidence of edema, infiltrates or effusions.  Heart size is within normal limits.  No bony abnormalities are identified.  IMPRESSION: No active disease.   Original Report Authenticated By: Irish Lack, M.D.     Microbiology: No results found for this or any previous visit (from the past 240 hour(s)).   Labs: Basic Metabolic Panel:  Recent Labs Lab 12/06/12 0627 12/06/12 0841 12/07/12 0437  NA 130*  --  137  K 4.6  --  4.5  CL 92*  --  102  CO2 27  --  27  GLUCOSE 87  --  106*  BUN 16  --  17  CREATININE 0.98  --  0.98  CALCIUM 9.5  --  8.9  MG  --  2.1  --    Liver Function Tests:  Recent Labs Lab 12/06/12 0627 12/07/12 0437  AST 20 13  ALT 13 11  ALKPHOS 89 78  BILITOT 0.3 0.2*  PROT 7.5 6.6  ALBUMIN 3.9 3.3*   No results found for this basename: LIPASE,  AMYLASE,  in the last 168 hours No results found for this basename: AMMONIA,  in the last 168 hours CBC:  Recent Labs Lab 12/06/12 0627  WBC 10.1  NEUTROABS 6.7  HGB 13.2  HCT 39.5  MCV 90.2  PLT 236   Cardiac Enzymes:  Recent Labs Lab 12/06/12 0627 12/06/12 0841 12/06/12 1454 12/06/12 2105  TROPONINI <0.30 <0.30 <0.30 <0.30   BNP: BNP (last 3 results) No results found for this basename: PROBNP,  in the last 8760 hours CBG:  Recent Labs Lab 12/06/12 0644  GLUCAP 119*       Signed:  Lavaeh Bau  [facility] 12/07/2012, 11:35 AM

## 2012-12-07 NOTE — Progress Notes (Signed)
UR Chart Review Completed  

## 2012-12-07 NOTE — Progress Notes (Signed)
Patient states understanding of discharge. 

## 2012-12-08 ENCOUNTER — Encounter: Payer: Self-pay | Admitting: Cardiology

## 2012-12-08 ENCOUNTER — Ambulatory Visit (INDEPENDENT_AMBULATORY_CARE_PROVIDER_SITE_OTHER): Payer: Medicare Other | Admitting: Cardiology

## 2012-12-08 VITALS — BP 132/77 | HR 71 | Ht 72.0 in | Wt 196.0 lb

## 2012-12-08 DIAGNOSIS — R001 Bradycardia, unspecified: Secondary | ICD-10-CM

## 2012-12-08 NOTE — Patient Instructions (Addendum)
Your physician recommends that you schedule a follow-up appointment as needed with [facility].

## 2012-12-08 NOTE — Progress Notes (Signed)
HPI [name] is a 78 year old white male I have been asked to see for near-syncope. He was admitted to the hospital. Please see discharge summary for details.  During that admission, it was felt he was most likely dehydrated and may have had a vertigo with presyncope episode from a sinus infection. Carotid Dopplers showed 50% stenosis in the right internal carotid artery and less than 50% on the left. Vertebral blood flow was antegrade bilaterally. Telemetry showed no arrhythmias other than sinus bradycardia. He was asymptomatic with this. He is not on any rate slowing drugs.  He denies any chest discomfort consistent with ischemic heart disease. He's had no symptoms of TIAs. He takes an 81 mg aspirin every other day because of bruising.  He denies orthopnea, PND, tachycardia palpitations, lower extremity edema.  He 78 years of age and still works full-time.  Past Medical History  Diagnosis Date  . PONV (postoperative nausea and vomiting)   . Hypertension   . Anxiety   . Depression   . BPH (benign prostatic hyperplasia)   . GERD (gastroesophageal reflux disease)   . Arthritis   . COPD (chronic obstructive pulmonary disease)   . HOH (hard of hearing)     Current Outpatient Prescriptions  Medication Sig Dispense Refill  . acetaminophen (TYLENOL) 500 MG tablet Take 1,000 mg by mouth every 6 (six) hours as needed. For back pain       . diazepam (VALIUM) 5 MG tablet Take 0.5 tablets (2.5 mg total) by mouth 2 (two) times daily.      Marland Kitchen losartan (COZAAR) 100 MG tablet Take 100 mg by mouth daily.        . Multiple Vitamins-Minerals (MULTIVITAMINS THER. W/MINERALS) TABS Take 1 tablet by mouth daily.        Marland Kitchen omeprazole (PRILOSEC) 20 MG capsule Take 20 mg by mouth 2 (two) times daily.        Marland Kitchen oxybutynin (DITROPAN-XL) 10 MG 24 hr tablet Take 10 mg by mouth daily.      Marland Kitchen oxymetazoline (AFRIN) 0.05 % nasal spray Place 1 spray into the nose 2 (two) times daily as needed for congestion.      .  senna-docusate (SENOKOT-S) 8.6-50 MG per tablet Take 1 tablet by mouth at bedtime.      . sertraline (ZOLOFT) 100 MG tablet Take 100 mg by mouth daily.        . sodium chloride (OCEAN) 0.65 % SOLN nasal spray Place 1 spray into the nose as needed for congestion.       No current facility-administered medications for this visit.    No Known Allergies  Family History  Problem Relation Age of Onset  . Anesthesia problems Neg Hx   . Hypotension Neg Hx   . Malignant hyperthermia Neg Hx   . Pseudochol deficiency Neg Hx     History   Social History  . Marital Status: Married    Spouse Name: N/A    Number of Children: N/A  . Years of Education: N/A   Occupational History  . Not on file.   Social History Main Topics  . Smoking status: Former Smoker -- 0.50 packs/day for 25 years    Types: Cigarettes    Quit date: 07/08/1991  . Smokeless tobacco: Not on file  . Alcohol Use: No  . Drug Use: No  . Sexually Active: Yes    Birth Control/ Protection: None   Other Topics Concern  . Not on file   Social History  Narrative  . No narrative on file    ROS ALL NEGATIVE EXCEPT THOSE NOTED IN HPI  PE  General Appearance: well developed, well nourished in no acute distress, looks tired than stated age HEENT: symmetrical face, PERRLA, good dentition  Neck: no JVD, thyromegaly, or adenopathy, trachea midline Chest: symmetric without deformity Cardiac: PMI non-displaced, RRR, normal S1, S2, no gallop or murmur Lung: clear to ausculation and percussion Vascular: all pulses, right carotid bruit Abdominal: nondistended, nontender, good bowel sounds, no HSM, no bruits Extremities: no cyanosis, clubbing or edema, no sign of DVT, no varicosities  Skin: normal color, no rashes Neuro: alert and oriented x 3, non-focal Pysch: normal affect  EKG EKG from hospitalization shows sinus bradycardia with a left anterior fascicular block.   BMET    Component Value Date/Time   NA 137 12/07/2012  0437   K 4.5 12/07/2012 0437   CL 102 12/07/2012 0437   CO2 27 12/07/2012 0437   GLUCOSE 106* 12/07/2012 0437   BUN 17 12/07/2012 0437   CREATININE 0.98 12/07/2012 0437   CALCIUM 8.9 12/07/2012 0437   GFRNONAA 78* 12/07/2012 0437   GFRAA >90 12/07/2012 0437    Lipid Panel     Component Value Date/Time   CHOL 106 12/07/2012 0437   TRIG 82 12/07/2012 0437   HDL 28* 12/07/2012 0437   CHOLHDL 3.8 12/07/2012 0437   VLDL 16 12/07/2012 0437   LDLCALC 62 12/07/2012 0437    CBC    Component Value Date/Time   WBC 10.1 12/06/2012 0627   RBC 4.38 12/06/2012 0627   HGB 13.2 12/06/2012 0627   HCT 39.5 12/06/2012 0627   PLT 236 12/06/2012 0627   MCV 90.2 12/06/2012 0627   MCH 30.1 12/06/2012 0627   MCHC 33.4 12/06/2012 0627   RDW 13.1 12/06/2012 0627   LYMPHSABS 1.7 12/06/2012 0627   MONOABS 1.4* 12/06/2012 0627   EOSABS 0.3 12/06/2012 0627   BASOSABS 0.0 12/06/2012 8657

## 2012-12-08 NOTE — Assessment & Plan Note (Signed)
Asymptomatic. He does have a left anterior fascicular block. I would recommend annual EKGs with primary care. He is followed by Dr. Reuel Boom.

## 2012-12-08 NOTE — Assessment & Plan Note (Signed)
Continue every other day aspirin and her blood pressure control. Repeat Dopplers in 2 years.

## 2012-12-08 NOTE — Assessment & Plan Note (Signed)
I do not feel this is cardiac. No further evaluation indicated. Followup when necessary.

## 2013-03-29 ENCOUNTER — Emergency Department (HOSPITAL_COMMUNITY)
Admission: EM | Admit: 2013-03-29 | Discharge: 2013-03-29 | Disposition: A | Discharge status: Home / Self Care | Payer: PRIVATE HEALTH INSURANCE | Attending: Emergency Medicine | Admitting: Emergency Medicine

## 2013-03-29 ENCOUNTER — Emergency Department (HOSPITAL_COMMUNITY): Payer: PRIVATE HEALTH INSURANCE

## 2013-03-29 ENCOUNTER — Encounter (HOSPITAL_COMMUNITY): Payer: Self-pay | Admitting: *Deleted

## 2013-03-29 DIAGNOSIS — IMO0002 Reserved for concepts with insufficient information to code with codable children: Secondary | ICD-10-CM | POA: Insufficient documentation

## 2013-03-29 DIAGNOSIS — Z87891 Personal history of nicotine dependence: Secondary | ICD-10-CM | POA: Insufficient documentation

## 2013-03-29 DIAGNOSIS — I10 Essential (primary) hypertension: Secondary | ICD-10-CM | POA: Insufficient documentation

## 2013-03-29 DIAGNOSIS — Z87448 Personal history of other diseases of urinary system: Secondary | ICD-10-CM | POA: Insufficient documentation

## 2013-03-29 DIAGNOSIS — Z8669 Personal history of other diseases of the nervous system and sense organs: Secondary | ICD-10-CM | POA: Insufficient documentation

## 2013-03-29 DIAGNOSIS — S51009A Unspecified open wound of unspecified elbow, initial encounter: Secondary | ICD-10-CM | POA: Insufficient documentation

## 2013-03-29 DIAGNOSIS — K219 Gastro-esophageal reflux disease without esophagitis: Secondary | ICD-10-CM | POA: Insufficient documentation

## 2013-03-29 DIAGNOSIS — T07XXXA Unspecified multiple injuries, initial encounter: Secondary | ICD-10-CM | POA: Insufficient documentation

## 2013-03-29 DIAGNOSIS — M545 Low back pain: Secondary | ICD-10-CM

## 2013-03-29 DIAGNOSIS — S46909A Unspecified injury of unspecified muscle, fascia and tendon at shoulder and upper arm level, unspecified arm, initial encounter: Secondary | ICD-10-CM | POA: Insufficient documentation

## 2013-03-29 DIAGNOSIS — F411 Generalized anxiety disorder: Secondary | ICD-10-CM | POA: Insufficient documentation

## 2013-03-29 DIAGNOSIS — M549 Dorsalgia, unspecified: Secondary | ICD-10-CM | POA: Insufficient documentation

## 2013-03-29 DIAGNOSIS — J4489 Other specified chronic obstructive pulmonary disease: Secondary | ICD-10-CM | POA: Insufficient documentation

## 2013-03-29 DIAGNOSIS — F329 Major depressive disorder, single episode, unspecified: Secondary | ICD-10-CM | POA: Insufficient documentation

## 2013-03-29 DIAGNOSIS — Z79899 Other long term (current) drug therapy: Secondary | ICD-10-CM | POA: Insufficient documentation

## 2013-03-29 DIAGNOSIS — S0990XA Unspecified injury of head, initial encounter: Secondary | ICD-10-CM

## 2013-03-29 DIAGNOSIS — S4980XA Other specified injuries of shoulder and upper arm, unspecified arm, initial encounter: Secondary | ICD-10-CM | POA: Insufficient documentation

## 2013-03-29 DIAGNOSIS — F3289 Other specified depressive episodes: Secondary | ICD-10-CM | POA: Insufficient documentation

## 2013-03-29 DIAGNOSIS — Z8739 Personal history of other diseases of the musculoskeletal system and connective tissue: Secondary | ICD-10-CM | POA: Insufficient documentation

## 2013-03-29 DIAGNOSIS — J449 Chronic obstructive pulmonary disease, unspecified: Secondary | ICD-10-CM | POA: Insufficient documentation

## 2013-03-29 MED ORDER — BACITRACIN-NEOMYCIN-POLYMYXIN 400-5-5000 EX OINT
TOPICAL_OINTMENT | Freq: Once | CUTANEOUS | Status: AC
  Administered 2013-03-29: 1 via TOPICAL
  Filled 2013-03-29: qty 1

## 2013-03-29 MED ORDER — HYDROCODONE-ACETAMINOPHEN 5-325 MG PO TABS: 1.0000 | ORAL_TABLET | ORAL | Status: DC | PRN

## 2013-03-29 MED ORDER — OXYCODONE-ACETAMINOPHEN 5-325 MG PO TABS
1.0000 | ORAL_TABLET | Freq: Once | ORAL | Status: AC
  Administered 2013-03-29: 1 via ORAL
  Filled 2013-03-29: qty 1
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
## 2013-03-29 NOTE — ED Notes (Signed)
Tetanus shot within the last year per pt, unsure of Hep B series
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
## 2013-03-30 NOTE — ED Provider Notes (Signed)
Medical screening examination/treatment/procedure(s) were performed by non-physician practitioner and as supervising physician I was immediately available for consultation/collaboration.    Cristen Murcia D Pancho Rushing, MD 03/30/13 1102 

## 2015-10-18 DIAGNOSIS — E876 Hypokalemia: Secondary | ICD-10-CM | POA: Diagnosis not present

## 2015-10-18 DIAGNOSIS — E039 Hypothyroidism, unspecified: Secondary | ICD-10-CM | POA: Diagnosis not present

## 2015-10-18 DIAGNOSIS — E871 Hypo-osmolality and hyponatremia: Secondary | ICD-10-CM | POA: Diagnosis not present

## 2015-10-18 DIAGNOSIS — D649 Anemia, unspecified: Secondary | ICD-10-CM | POA: Diagnosis not present

## 2015-10-25 DIAGNOSIS — J301 Allergic rhinitis due to pollen: Secondary | ICD-10-CM | POA: Diagnosis not present

## 2015-10-25 DIAGNOSIS — E875 Hyperkalemia: Secondary | ICD-10-CM | POA: Diagnosis not present

## 2015-10-25 DIAGNOSIS — Z9189 Other specified personal risk factors, not elsewhere classified: Secondary | ICD-10-CM | POA: Diagnosis not present

## 2015-10-25 DIAGNOSIS — E039 Hypothyroidism, unspecified: Secondary | ICD-10-CM | POA: Diagnosis not present

## 2015-10-25 DIAGNOSIS — Z1389 Encounter for screening for other disorder: Secondary | ICD-10-CM | POA: Diagnosis not present

## 2015-10-25 DIAGNOSIS — F331 Major depressive disorder, recurrent, moderate: Secondary | ICD-10-CM | POA: Diagnosis not present

## 2015-10-25 DIAGNOSIS — G301 Alzheimer's disease with late onset: Secondary | ICD-10-CM | POA: Diagnosis not present

## 2015-10-25 DIAGNOSIS — I10 Essential (primary) hypertension: Secondary | ICD-10-CM | POA: Diagnosis not present

## 2015-10-25 DIAGNOSIS — K219 Gastro-esophageal reflux disease without esophagitis: Secondary | ICD-10-CM | POA: Diagnosis not present

## 2015-11-27 DIAGNOSIS — N529 Male erectile dysfunction, unspecified: Secondary | ICD-10-CM | POA: Diagnosis not present

## 2015-11-27 DIAGNOSIS — N3281 Overactive bladder: Secondary | ICD-10-CM | POA: Diagnosis not present

## 2015-11-27 DIAGNOSIS — R3915 Urgency of urination: Secondary | ICD-10-CM | POA: Diagnosis not present

## 2015-11-27 DIAGNOSIS — N4281 Prostatodynia syndrome: Secondary | ICD-10-CM | POA: Diagnosis not present

## 2015-12-26 DIAGNOSIS — N4281 Prostatodynia syndrome: Secondary | ICD-10-CM | POA: Diagnosis not present

## 2015-12-26 DIAGNOSIS — R3915 Urgency of urination: Secondary | ICD-10-CM | POA: Diagnosis not present

## 2015-12-26 DIAGNOSIS — N393 Stress incontinence (female) (male): Secondary | ICD-10-CM | POA: Diagnosis not present

## 2015-12-28 DIAGNOSIS — J209 Acute bronchitis, unspecified: Secondary | ICD-10-CM | POA: Diagnosis not present

## 2016-02-22 DIAGNOSIS — Z1211 Encounter for screening for malignant neoplasm of colon: Secondary | ICD-10-CM | POA: Diagnosis not present

## 2016-02-26 DIAGNOSIS — L820 Inflamed seborrheic keratosis: Secondary | ICD-10-CM | POA: Diagnosis not present

## 2016-02-26 DIAGNOSIS — Z08 Encounter for follow-up examination after completed treatment for malignant neoplasm: Secondary | ICD-10-CM | POA: Diagnosis not present

## 2016-02-26 DIAGNOSIS — Z85820 Personal history of malignant melanoma of skin: Secondary | ICD-10-CM | POA: Diagnosis not present

## 2016-02-26 DIAGNOSIS — X32XXXD Exposure to sunlight, subsequent encounter: Secondary | ICD-10-CM | POA: Diagnosis not present

## 2016-02-26 DIAGNOSIS — Z1283 Encounter for screening for malignant neoplasm of skin: Secondary | ICD-10-CM | POA: Diagnosis not present

## 2016-02-26 DIAGNOSIS — L570 Actinic keratosis: Secondary | ICD-10-CM | POA: Diagnosis not present

## 2016-02-26 DIAGNOSIS — C4441 Basal cell carcinoma of skin of scalp and neck: Secondary | ICD-10-CM | POA: Diagnosis not present

## 2016-02-29 DIAGNOSIS — F329 Major depressive disorder, single episode, unspecified: Secondary | ICD-10-CM | POA: Diagnosis not present

## 2016-02-29 DIAGNOSIS — E039 Hypothyroidism, unspecified: Secondary | ICD-10-CM | POA: Diagnosis not present

## 2016-02-29 DIAGNOSIS — Z8249 Family history of ischemic heart disease and other diseases of the circulatory system: Secondary | ICD-10-CM | POA: Diagnosis not present

## 2016-02-29 DIAGNOSIS — K219 Gastro-esophageal reflux disease without esophagitis: Secondary | ICD-10-CM | POA: Diagnosis not present

## 2016-02-29 DIAGNOSIS — Z887 Allergy status to serum and vaccine status: Secondary | ICD-10-CM | POA: Diagnosis not present

## 2016-02-29 DIAGNOSIS — Z96642 Presence of left artificial hip joint: Secondary | ICD-10-CM | POA: Diagnosis not present

## 2016-02-29 DIAGNOSIS — I10 Essential (primary) hypertension: Secondary | ICD-10-CM | POA: Diagnosis not present

## 2016-02-29 DIAGNOSIS — Z79899 Other long term (current) drug therapy: Secondary | ICD-10-CM | POA: Diagnosis not present

## 2016-02-29 DIAGNOSIS — Z7982 Long term (current) use of aspirin: Secondary | ICD-10-CM | POA: Diagnosis not present

## 2016-02-29 DIAGNOSIS — Z1211 Encounter for screening for malignant neoplasm of colon: Secondary | ICD-10-CM | POA: Diagnosis not present

## 2016-03-04 DIAGNOSIS — J301 Allergic rhinitis due to pollen: Secondary | ICD-10-CM | POA: Diagnosis not present

## 2016-03-04 DIAGNOSIS — J209 Acute bronchitis, unspecified: Secondary | ICD-10-CM | POA: Diagnosis not present

## 2016-03-10 DIAGNOSIS — R001 Bradycardia, unspecified: Secondary | ICD-10-CM | POA: Diagnosis not present

## 2016-03-10 DIAGNOSIS — F0280 Dementia in other diseases classified elsewhere without behavioral disturbance: Secondary | ICD-10-CM | POA: Diagnosis not present

## 2016-03-10 DIAGNOSIS — N410 Acute prostatitis: Secondary | ICD-10-CM | POA: Diagnosis not present

## 2016-03-10 DIAGNOSIS — Z87891 Personal history of nicotine dependence: Secondary | ICD-10-CM | POA: Diagnosis not present

## 2016-03-10 DIAGNOSIS — G309 Alzheimer's disease, unspecified: Secondary | ICD-10-CM | POA: Diagnosis not present

## 2016-03-10 DIAGNOSIS — E871 Hypo-osmolality and hyponatremia: Secondary | ICD-10-CM | POA: Diagnosis not present

## 2016-03-10 DIAGNOSIS — E222 Syndrome of inappropriate secretion of antidiuretic hormone: Secondary | ICD-10-CM | POA: Diagnosis not present

## 2016-03-10 DIAGNOSIS — Z79899 Other long term (current) drug therapy: Secondary | ICD-10-CM | POA: Diagnosis not present

## 2016-03-10 DIAGNOSIS — I10 Essential (primary) hypertension: Secondary | ICD-10-CM | POA: Diagnosis not present

## 2016-03-10 DIAGNOSIS — D638 Anemia in other chronic diseases classified elsewhere: Secondary | ICD-10-CM | POA: Diagnosis not present

## 2016-03-10 DIAGNOSIS — R531 Weakness: Secondary | ICD-10-CM | POA: Diagnosis not present

## 2016-03-10 DIAGNOSIS — F332 Major depressive disorder, recurrent severe without psychotic features: Secondary | ICD-10-CM | POA: Diagnosis not present

## 2016-03-10 DIAGNOSIS — N451 Epididymitis: Secondary | ICD-10-CM | POA: Diagnosis not present

## 2016-03-10 DIAGNOSIS — N3281 Overactive bladder: Secondary | ICD-10-CM | POA: Diagnosis not present

## 2016-03-10 DIAGNOSIS — Z887 Allergy status to serum and vaccine status: Secondary | ICD-10-CM | POA: Diagnosis not present

## 2016-03-10 DIAGNOSIS — J449 Chronic obstructive pulmonary disease, unspecified: Secondary | ICD-10-CM | POA: Diagnosis not present

## 2016-03-10 DIAGNOSIS — Z7982 Long term (current) use of aspirin: Secondary | ICD-10-CM | POA: Diagnosis not present

## 2016-03-10 DIAGNOSIS — K219 Gastro-esophageal reflux disease without esophagitis: Secondary | ICD-10-CM | POA: Diagnosis not present

## 2016-03-10 DIAGNOSIS — N400 Enlarged prostate without lower urinary tract symptoms: Secondary | ICD-10-CM | POA: Diagnosis not present

## 2016-03-11 DIAGNOSIS — E871 Hypo-osmolality and hyponatremia: Secondary | ICD-10-CM | POA: Diagnosis not present

## 2016-03-11 DIAGNOSIS — N410 Acute prostatitis: Secondary | ICD-10-CM | POA: Diagnosis not present

## 2016-03-12 DIAGNOSIS — E871 Hypo-osmolality and hyponatremia: Secondary | ICD-10-CM | POA: Diagnosis not present

## 2016-03-12 DIAGNOSIS — N410 Acute prostatitis: Secondary | ICD-10-CM | POA: Diagnosis not present

## 2016-03-13 DIAGNOSIS — N410 Acute prostatitis: Secondary | ICD-10-CM | POA: Diagnosis not present

## 2016-03-13 DIAGNOSIS — E871 Hypo-osmolality and hyponatremia: Secondary | ICD-10-CM | POA: Diagnosis not present

## 2016-03-20 DIAGNOSIS — E871 Hypo-osmolality and hyponatremia: Secondary | ICD-10-CM | POA: Diagnosis not present

## 2016-03-20 DIAGNOSIS — N451 Epididymitis: Secondary | ICD-10-CM | POA: Diagnosis not present

## 2016-03-22 DIAGNOSIS — E871 Hypo-osmolality and hyponatremia: Secondary | ICD-10-CM | POA: Diagnosis not present

## 2016-03-28 DIAGNOSIS — Z87438 Personal history of other diseases of male genital organs: Secondary | ICD-10-CM | POA: Diagnosis not present

## 2016-03-28 DIAGNOSIS — R3915 Urgency of urination: Secondary | ICD-10-CM | POA: Diagnosis not present

## 2016-04-13 DIAGNOSIS — E871 Hypo-osmolality and hyponatremia: Secondary | ICD-10-CM | POA: Diagnosis not present

## 2016-04-13 DIAGNOSIS — N451 Epididymitis: Secondary | ICD-10-CM | POA: Diagnosis not present

## 2016-04-30 DIAGNOSIS — Z87438 Personal history of other diseases of male genital organs: Secondary | ICD-10-CM | POA: Diagnosis not present

## 2016-04-30 DIAGNOSIS — R3915 Urgency of urination: Secondary | ICD-10-CM | POA: Diagnosis not present

## 2016-04-30 DIAGNOSIS — N3281 Overactive bladder: Secondary | ICD-10-CM | POA: Diagnosis not present

## 2016-05-13 DIAGNOSIS — S7002XA Contusion of left hip, initial encounter: Secondary | ICD-10-CM | POA: Diagnosis not present

## 2016-05-13 DIAGNOSIS — Z6828 Body mass index (BMI) 28.0-28.9, adult: Secondary | ICD-10-CM | POA: Diagnosis not present

## 2016-05-13 DIAGNOSIS — R296 Repeated falls: Secondary | ICD-10-CM | POA: Diagnosis not present

## 2016-05-13 DIAGNOSIS — E871 Hypo-osmolality and hyponatremia: Secondary | ICD-10-CM | POA: Diagnosis not present

## 2016-05-13 DIAGNOSIS — B356 Tinea cruris: Secondary | ICD-10-CM | POA: Diagnosis not present

## 2016-05-14 DIAGNOSIS — I10 Essential (primary) hypertension: Secondary | ICD-10-CM | POA: Diagnosis not present

## 2016-05-14 DIAGNOSIS — M545 Low back pain: Secondary | ICD-10-CM | POA: Diagnosis not present

## 2016-05-14 DIAGNOSIS — F339 Major depressive disorder, recurrent, unspecified: Secondary | ICD-10-CM | POA: Diagnosis not present

## 2016-05-14 DIAGNOSIS — T43225A Adverse effect of selective serotonin reuptake inhibitors, initial encounter: Secondary | ICD-10-CM | POA: Diagnosis not present

## 2016-05-14 DIAGNOSIS — K219 Gastro-esophageal reflux disease without esophagitis: Secondary | ICD-10-CM | POA: Diagnosis not present

## 2016-05-14 DIAGNOSIS — N400 Enlarged prostate without lower urinary tract symptoms: Secondary | ICD-10-CM | POA: Diagnosis not present

## 2016-05-14 DIAGNOSIS — K5900 Constipation, unspecified: Secondary | ICD-10-CM | POA: Diagnosis not present

## 2016-05-14 DIAGNOSIS — W1830XA Fall on same level, unspecified, initial encounter: Secondary | ICD-10-CM | POA: Diagnosis not present

## 2016-05-14 DIAGNOSIS — E871 Hypo-osmolality and hyponatremia: Secondary | ICD-10-CM | POA: Diagnosis not present

## 2016-05-14 DIAGNOSIS — E875 Hyperkalemia: Secondary | ICD-10-CM | POA: Diagnosis not present

## 2016-05-14 DIAGNOSIS — J310 Chronic rhinitis: Secondary | ICD-10-CM | POA: Diagnosis not present

## 2016-05-14 DIAGNOSIS — E222 Syndrome of inappropriate secretion of antidiuretic hormone: Secondary | ICD-10-CM | POA: Diagnosis not present

## 2016-05-14 DIAGNOSIS — E039 Hypothyroidism, unspecified: Secondary | ICD-10-CM | POA: Diagnosis not present

## 2016-05-14 DIAGNOSIS — F0280 Dementia in other diseases classified elsewhere without behavioral disturbance: Secondary | ICD-10-CM | POA: Diagnosis not present

## 2016-05-14 DIAGNOSIS — T485X5A Adverse effect of other anti-common-cold drugs, initial encounter: Secondary | ICD-10-CM | POA: Diagnosis not present

## 2016-05-14 DIAGNOSIS — Z79899 Other long term (current) drug therapy: Secondary | ICD-10-CM | POA: Diagnosis not present

## 2016-05-14 DIAGNOSIS — M5137 Other intervertebral disc degeneration, lumbosacral region: Secondary | ICD-10-CM | POA: Diagnosis not present

## 2016-05-14 DIAGNOSIS — S7002XA Contusion of left hip, initial encounter: Secondary | ICD-10-CM | POA: Diagnosis not present

## 2016-05-14 DIAGNOSIS — T465X5A Adverse effect of other antihypertensive drugs, initial encounter: Secondary | ICD-10-CM | POA: Diagnosis not present

## 2016-05-14 DIAGNOSIS — G309 Alzheimer's disease, unspecified: Secondary | ICD-10-CM | POA: Diagnosis not present

## 2016-05-14 DIAGNOSIS — M5136 Other intervertebral disc degeneration, lumbar region: Secondary | ICD-10-CM | POA: Diagnosis not present

## 2016-05-15 DIAGNOSIS — E871 Hypo-osmolality and hyponatremia: Secondary | ICD-10-CM | POA: Diagnosis not present

## 2016-05-15 DIAGNOSIS — E875 Hyperkalemia: Secondary | ICD-10-CM | POA: Diagnosis not present

## 2016-05-16 DIAGNOSIS — E871 Hypo-osmolality and hyponatremia: Secondary | ICD-10-CM | POA: Diagnosis not present

## 2016-05-16 DIAGNOSIS — E875 Hyperkalemia: Secondary | ICD-10-CM | POA: Diagnosis not present

## 2016-05-17 DIAGNOSIS — E875 Hyperkalemia: Secondary | ICD-10-CM | POA: Diagnosis not present

## 2016-05-17 DIAGNOSIS — E871 Hypo-osmolality and hyponatremia: Secondary | ICD-10-CM | POA: Diagnosis not present

## 2016-05-18 DIAGNOSIS — E871 Hypo-osmolality and hyponatremia: Secondary | ICD-10-CM | POA: Diagnosis not present

## 2016-05-18 DIAGNOSIS — E875 Hyperkalemia: Secondary | ICD-10-CM | POA: Diagnosis not present

## 2016-05-19 DIAGNOSIS — E871 Hypo-osmolality and hyponatremia: Secondary | ICD-10-CM | POA: Diagnosis not present

## 2016-05-19 DIAGNOSIS — E875 Hyperkalemia: Secondary | ICD-10-CM | POA: Diagnosis not present

## 2016-05-21 DIAGNOSIS — E875 Hyperkalemia: Secondary | ICD-10-CM | POA: Diagnosis not present

## 2016-05-21 DIAGNOSIS — Z6828 Body mass index (BMI) 28.0-28.9, adult: Secondary | ICD-10-CM | POA: Diagnosis not present

## 2016-05-21 DIAGNOSIS — E871 Hypo-osmolality and hyponatremia: Secondary | ICD-10-CM | POA: Diagnosis not present

## 2016-05-21 DIAGNOSIS — K219 Gastro-esophageal reflux disease without esophagitis: Secondary | ICD-10-CM | POA: Diagnosis not present

## 2016-05-21 DIAGNOSIS — K5901 Slow transit constipation: Secondary | ICD-10-CM | POA: Diagnosis not present

## 2016-05-24 DIAGNOSIS — M9903 Segmental and somatic dysfunction of lumbar region: Secondary | ICD-10-CM | POA: Diagnosis not present

## 2016-05-24 DIAGNOSIS — S336XXA Sprain of sacroiliac joint, initial encounter: Secondary | ICD-10-CM | POA: Diagnosis not present

## 2016-05-24 DIAGNOSIS — M47816 Spondylosis without myelopathy or radiculopathy, lumbar region: Secondary | ICD-10-CM | POA: Diagnosis not present

## 2016-05-27 DIAGNOSIS — S336XXA Sprain of sacroiliac joint, initial encounter: Secondary | ICD-10-CM | POA: Diagnosis not present

## 2016-05-27 DIAGNOSIS — M9903 Segmental and somatic dysfunction of lumbar region: Secondary | ICD-10-CM | POA: Diagnosis not present

## 2016-05-27 DIAGNOSIS — M47816 Spondylosis without myelopathy or radiculopathy, lumbar region: Secondary | ICD-10-CM | POA: Diagnosis not present

## 2016-05-28 DIAGNOSIS — E875 Hyperkalemia: Secondary | ICD-10-CM | POA: Diagnosis not present

## 2016-05-29 DIAGNOSIS — M545 Low back pain: Secondary | ICD-10-CM | POA: Diagnosis not present

## 2016-05-29 DIAGNOSIS — M25559 Pain in unspecified hip: Secondary | ICD-10-CM | POA: Diagnosis not present

## 2016-05-29 DIAGNOSIS — R2681 Unsteadiness on feet: Secondary | ICD-10-CM | POA: Diagnosis not present

## 2016-05-30 DIAGNOSIS — S336XXA Sprain of sacroiliac joint, initial encounter: Secondary | ICD-10-CM | POA: Diagnosis not present

## 2016-05-30 DIAGNOSIS — M47816 Spondylosis without myelopathy or radiculopathy, lumbar region: Secondary | ICD-10-CM | POA: Diagnosis not present

## 2016-05-30 DIAGNOSIS — M9903 Segmental and somatic dysfunction of lumbar region: Secondary | ICD-10-CM | POA: Diagnosis not present

## 2016-06-06 DIAGNOSIS — R3915 Urgency of urination: Secondary | ICD-10-CM | POA: Diagnosis not present

## 2016-06-06 DIAGNOSIS — N3281 Overactive bladder: Secondary | ICD-10-CM | POA: Diagnosis not present

## 2016-06-10 DIAGNOSIS — L820 Inflamed seborrheic keratosis: Secondary | ICD-10-CM | POA: Diagnosis not present

## 2016-06-10 DIAGNOSIS — Z85820 Personal history of malignant melanoma of skin: Secondary | ICD-10-CM | POA: Diagnosis not present

## 2016-06-10 DIAGNOSIS — L821 Other seborrheic keratosis: Secondary | ICD-10-CM | POA: Diagnosis not present

## 2016-06-10 DIAGNOSIS — Z08 Encounter for follow-up examination after completed treatment for malignant neoplasm: Secondary | ICD-10-CM | POA: Diagnosis not present

## 2016-06-10 DIAGNOSIS — Z85828 Personal history of other malignant neoplasm of skin: Secondary | ICD-10-CM | POA: Diagnosis not present

## 2016-06-12 DIAGNOSIS — H8112 Benign paroxysmal vertigo, left ear: Secondary | ICD-10-CM | POA: Diagnosis not present

## 2016-06-12 DIAGNOSIS — Z6827 Body mass index (BMI) 27.0-27.9, adult: Secondary | ICD-10-CM | POA: Diagnosis not present

## 2016-06-12 DIAGNOSIS — E871 Hypo-osmolality and hyponatremia: Secondary | ICD-10-CM | POA: Diagnosis not present

## 2016-06-18 DIAGNOSIS — Z6828 Body mass index (BMI) 28.0-28.9, adult: Secondary | ICD-10-CM | POA: Diagnosis not present

## 2016-06-18 DIAGNOSIS — K219 Gastro-esophageal reflux disease without esophagitis: Secondary | ICD-10-CM | POA: Diagnosis not present

## 2016-06-18 DIAGNOSIS — M545 Low back pain: Secondary | ICD-10-CM | POA: Diagnosis not present

## 2016-06-18 DIAGNOSIS — E875 Hyperkalemia: Secondary | ICD-10-CM | POA: Diagnosis not present

## 2016-06-18 DIAGNOSIS — K5901 Slow transit constipation: Secondary | ICD-10-CM | POA: Diagnosis not present

## 2016-06-18 DIAGNOSIS — E871 Hypo-osmolality and hyponatremia: Secondary | ICD-10-CM | POA: Diagnosis not present

## 2016-06-26 DIAGNOSIS — Z23 Encounter for immunization: Secondary | ICD-10-CM | POA: Diagnosis not present

## 2016-06-26 DIAGNOSIS — D519 Vitamin B12 deficiency anemia, unspecified: Secondary | ICD-10-CM | POA: Diagnosis not present

## 2016-06-26 DIAGNOSIS — Z6827 Body mass index (BMI) 27.0-27.9, adult: Secondary | ICD-10-CM | POA: Diagnosis not present

## 2016-06-26 DIAGNOSIS — G6289 Other specified polyneuropathies: Secondary | ICD-10-CM | POA: Diagnosis not present

## 2016-06-26 DIAGNOSIS — B356 Tinea cruris: Secondary | ICD-10-CM | POA: Diagnosis not present

## 2016-07-05 DIAGNOSIS — F331 Major depressive disorder, recurrent, moderate: Secondary | ICD-10-CM | POA: Diagnosis not present

## 2016-07-05 DIAGNOSIS — Z6826 Body mass index (BMI) 26.0-26.9, adult: Secondary | ICD-10-CM | POA: Diagnosis not present

## 2016-07-05 DIAGNOSIS — R42 Dizziness and giddiness: Secondary | ICD-10-CM | POA: Diagnosis not present

## 2016-07-18 DIAGNOSIS — R351 Nocturia: Secondary | ICD-10-CM | POA: Diagnosis not present

## 2016-07-18 DIAGNOSIS — N3281 Overactive bladder: Secondary | ICD-10-CM | POA: Diagnosis not present

## 2016-08-07 DIAGNOSIS — E871 Hypo-osmolality and hyponatremia: Secondary | ICD-10-CM | POA: Diagnosis not present

## 2016-08-07 DIAGNOSIS — Z6826 Body mass index (BMI) 26.0-26.9, adult: Secondary | ICD-10-CM | POA: Diagnosis not present

## 2016-08-07 DIAGNOSIS — E039 Hypothyroidism, unspecified: Secondary | ICD-10-CM | POA: Diagnosis not present

## 2016-08-07 DIAGNOSIS — R61 Generalized hyperhidrosis: Secondary | ICD-10-CM | POA: Diagnosis not present

## 2016-08-08 DIAGNOSIS — E875 Hyperkalemia: Secondary | ICD-10-CM | POA: Diagnosis not present

## 2016-08-12 DIAGNOSIS — E875 Hyperkalemia: Secondary | ICD-10-CM | POA: Diagnosis not present

## 2016-08-21 DIAGNOSIS — G6289 Other specified polyneuropathies: Secondary | ICD-10-CM | POA: Diagnosis not present

## 2016-08-21 DIAGNOSIS — Z6826 Body mass index (BMI) 26.0-26.9, adult: Secondary | ICD-10-CM | POA: Diagnosis not present

## 2016-08-21 DIAGNOSIS — E039 Hypothyroidism, unspecified: Secondary | ICD-10-CM | POA: Diagnosis not present

## 2016-08-21 DIAGNOSIS — R300 Dysuria: Secondary | ICD-10-CM | POA: Diagnosis not present

## 2016-08-21 DIAGNOSIS — E875 Hyperkalemia: Secondary | ICD-10-CM | POA: Diagnosis not present

## 2016-08-26 DIAGNOSIS — E875 Hyperkalemia: Secondary | ICD-10-CM | POA: Diagnosis not present

## 2016-08-29 DIAGNOSIS — H9313 Tinnitus, bilateral: Secondary | ICD-10-CM | POA: Diagnosis not present

## 2016-08-29 DIAGNOSIS — R2689 Other abnormalities of gait and mobility: Secondary | ICD-10-CM | POA: Diagnosis not present

## 2016-08-29 DIAGNOSIS — Z6826 Body mass index (BMI) 26.0-26.9, adult: Secondary | ICD-10-CM | POA: Diagnosis not present

## 2016-08-30 DIAGNOSIS — R351 Nocturia: Secondary | ICD-10-CM | POA: Diagnosis not present

## 2016-08-30 DIAGNOSIS — R3915 Urgency of urination: Secondary | ICD-10-CM | POA: Diagnosis not present

## 2016-09-09 DIAGNOSIS — E875 Hyperkalemia: Secondary | ICD-10-CM | POA: Diagnosis not present

## 2016-09-09 DIAGNOSIS — E871 Hypo-osmolality and hyponatremia: Secondary | ICD-10-CM | POA: Diagnosis not present

## 2016-09-18 DIAGNOSIS — G319 Degenerative disease of nervous system, unspecified: Secondary | ICD-10-CM | POA: Diagnosis not present

## 2016-09-18 DIAGNOSIS — R413 Other amnesia: Secondary | ICD-10-CM | POA: Diagnosis not present

## 2016-09-18 DIAGNOSIS — R296 Repeated falls: Secondary | ICD-10-CM | POA: Diagnosis not present

## 2016-12-05 DIAGNOSIS — M9903 Segmental and somatic dysfunction of lumbar region: Secondary | ICD-10-CM | POA: Diagnosis not present

## 2016-12-05 DIAGNOSIS — S336XXA Sprain of sacroiliac joint, initial encounter: Secondary | ICD-10-CM | POA: Diagnosis not present

## 2016-12-05 DIAGNOSIS — M47816 Spondylosis without myelopathy or radiculopathy, lumbar region: Secondary | ICD-10-CM | POA: Diagnosis not present

## 2016-12-11 DIAGNOSIS — M9903 Segmental and somatic dysfunction of lumbar region: Secondary | ICD-10-CM | POA: Diagnosis not present

## 2016-12-11 DIAGNOSIS — M47816 Spondylosis without myelopathy or radiculopathy, lumbar region: Secondary | ICD-10-CM | POA: Diagnosis not present

## 2016-12-11 DIAGNOSIS — S336XXA Sprain of sacroiliac joint, initial encounter: Secondary | ICD-10-CM | POA: Diagnosis not present

## 2016-12-19 DIAGNOSIS — M9903 Segmental and somatic dysfunction of lumbar region: Secondary | ICD-10-CM | POA: Diagnosis not present

## 2016-12-19 DIAGNOSIS — S336XXA Sprain of sacroiliac joint, initial encounter: Secondary | ICD-10-CM | POA: Diagnosis not present

## 2016-12-19 DIAGNOSIS — M47816 Spondylosis without myelopathy or radiculopathy, lumbar region: Secondary | ICD-10-CM | POA: Diagnosis not present

## 2017-01-01 DIAGNOSIS — J209 Acute bronchitis, unspecified: Secondary | ICD-10-CM | POA: Diagnosis not present

## 2017-01-01 DIAGNOSIS — N401 Enlarged prostate with lower urinary tract symptoms: Secondary | ICD-10-CM | POA: Diagnosis not present

## 2017-01-01 DIAGNOSIS — Z6827 Body mass index (BMI) 27.0-27.9, adult: Secondary | ICD-10-CM | POA: Diagnosis not present

## 2017-01-01 DIAGNOSIS — J0100 Acute maxillary sinusitis, unspecified: Secondary | ICD-10-CM | POA: Diagnosis not present

## 2017-01-01 DIAGNOSIS — R350 Frequency of micturition: Secondary | ICD-10-CM | POA: Diagnosis not present

## 2017-01-05 DIAGNOSIS — F0280 Dementia in other diseases classified elsewhere without behavioral disturbance: Secondary | ICD-10-CM | POA: Diagnosis not present

## 2017-01-05 DIAGNOSIS — R51 Headache: Secondary | ICD-10-CM | POA: Diagnosis not present

## 2017-01-05 DIAGNOSIS — Z79899 Other long term (current) drug therapy: Secondary | ICD-10-CM | POA: Diagnosis not present

## 2017-01-05 DIAGNOSIS — E871 Hypo-osmolality and hyponatremia: Secondary | ICD-10-CM | POA: Diagnosis not present

## 2017-01-05 DIAGNOSIS — J101 Influenza due to other identified influenza virus with other respiratory manifestations: Secondary | ICD-10-CM | POA: Diagnosis not present

## 2017-01-05 DIAGNOSIS — S72002A Fracture of unspecified part of neck of left femur, initial encounter for closed fracture: Secondary | ICD-10-CM | POA: Diagnosis not present

## 2017-01-05 DIAGNOSIS — W1830XA Fall on same level, unspecified, initial encounter: Secondary | ICD-10-CM | POA: Diagnosis not present

## 2017-01-05 DIAGNOSIS — S199XXA Unspecified injury of neck, initial encounter: Secondary | ICD-10-CM | POA: Diagnosis not present

## 2017-01-05 DIAGNOSIS — J1089 Influenza due to other identified influenza virus with other manifestations: Secondary | ICD-10-CM | POA: Diagnosis not present

## 2017-01-05 DIAGNOSIS — F339 Major depressive disorder, recurrent, unspecified: Secondary | ICD-10-CM | POA: Diagnosis not present

## 2017-01-05 DIAGNOSIS — W1839XA Other fall on same level, initial encounter: Secondary | ICD-10-CM | POA: Diagnosis not present

## 2017-01-05 DIAGNOSIS — S0990XA Unspecified injury of head, initial encounter: Secondary | ICD-10-CM | POA: Diagnosis not present

## 2017-01-05 DIAGNOSIS — E039 Hypothyroidism, unspecified: Secondary | ICD-10-CM | POA: Diagnosis not present

## 2017-01-05 DIAGNOSIS — G309 Alzheimer's disease, unspecified: Secondary | ICD-10-CM | POA: Diagnosis not present

## 2017-01-05 DIAGNOSIS — I10 Essential (primary) hypertension: Secondary | ICD-10-CM | POA: Diagnosis not present

## 2017-01-05 DIAGNOSIS — W2209XA Striking against other stationary object, initial encounter: Secondary | ICD-10-CM | POA: Diagnosis not present

## 2017-01-05 DIAGNOSIS — E222 Syndrome of inappropriate secretion of antidiuretic hormone: Secondary | ICD-10-CM | POA: Diagnosis not present

## 2017-01-05 DIAGNOSIS — Z96642 Presence of left artificial hip joint: Secondary | ICD-10-CM | POA: Diagnosis not present

## 2017-01-05 DIAGNOSIS — S299XXA Unspecified injury of thorax, initial encounter: Secondary | ICD-10-CM | POA: Diagnosis not present

## 2017-01-05 DIAGNOSIS — Z9181 History of falling: Secondary | ICD-10-CM | POA: Diagnosis not present

## 2017-01-05 DIAGNOSIS — S72335A Nondisplaced oblique fracture of shaft of left femur, initial encounter for closed fracture: Secondary | ICD-10-CM | POA: Diagnosis not present

## 2017-01-05 DIAGNOSIS — D649 Anemia, unspecified: Secondary | ICD-10-CM | POA: Diagnosis not present

## 2017-01-05 DIAGNOSIS — M542 Cervicalgia: Secondary | ICD-10-CM | POA: Diagnosis not present

## 2017-01-05 DIAGNOSIS — Z7982 Long term (current) use of aspirin: Secondary | ICD-10-CM | POA: Diagnosis not present

## 2017-01-05 DIAGNOSIS — Y92000 Kitchen of unspecified non-institutional (private) residence as  the place of occurrence of the external cause: Secondary | ICD-10-CM | POA: Diagnosis not present

## 2017-01-05 DIAGNOSIS — S72402A Unspecified fracture of lower end of left femur, initial encounter for closed fracture: Secondary | ICD-10-CM | POA: Diagnosis not present

## 2017-01-05 DIAGNOSIS — G609 Hereditary and idiopathic neuropathy, unspecified: Secondary | ICD-10-CM | POA: Diagnosis not present

## 2017-01-05 DIAGNOSIS — J449 Chronic obstructive pulmonary disease, unspecified: Secondary | ICD-10-CM | POA: Diagnosis not present

## 2017-01-05 DIAGNOSIS — Z87891 Personal history of nicotine dependence: Secondary | ICD-10-CM | POA: Diagnosis not present

## 2017-01-05 DIAGNOSIS — Z887 Allergy status to serum and vaccine status: Secondary | ICD-10-CM | POA: Diagnosis not present

## 2017-01-06 DIAGNOSIS — S72002A Fracture of unspecified part of neck of left femur, initial encounter for closed fracture: Secondary | ICD-10-CM | POA: Diagnosis not present

## 2017-01-06 DIAGNOSIS — E871 Hypo-osmolality and hyponatremia: Secondary | ICD-10-CM | POA: Diagnosis not present

## 2017-01-06 DIAGNOSIS — J101 Influenza due to other identified influenza virus with other respiratory manifestations: Secondary | ICD-10-CM | POA: Diagnosis not present

## 2017-01-07 DIAGNOSIS — Z743 Need for continuous supervision: Secondary | ICD-10-CM | POA: Diagnosis not present

## 2017-01-07 DIAGNOSIS — E871 Hypo-osmolality and hyponatremia: Secondary | ICD-10-CM | POA: Diagnosis not present

## 2017-01-07 DIAGNOSIS — M84352A Stress fracture, left femur, initial encounter for fracture: Secondary | ICD-10-CM | POA: Diagnosis not present

## 2017-01-07 DIAGNOSIS — G3184 Mild cognitive impairment, so stated: Secondary | ICD-10-CM | POA: Diagnosis not present

## 2017-01-07 DIAGNOSIS — J101 Influenza due to other identified influenza virus with other respiratory manifestations: Secondary | ICD-10-CM | POA: Diagnosis not present

## 2017-01-07 DIAGNOSIS — R279 Unspecified lack of coordination: Secondary | ICD-10-CM | POA: Diagnosis not present

## 2017-01-08 DIAGNOSIS — G9009 Other idiopathic peripheral autonomic neuropathy: Secondary | ICD-10-CM | POA: Diagnosis not present

## 2017-01-08 DIAGNOSIS — Z7982 Long term (current) use of aspirin: Secondary | ICD-10-CM | POA: Diagnosis not present

## 2017-01-08 DIAGNOSIS — E039 Hypothyroidism, unspecified: Secondary | ICD-10-CM | POA: Diagnosis not present

## 2017-01-08 DIAGNOSIS — G309 Alzheimer's disease, unspecified: Secondary | ICD-10-CM | POA: Diagnosis not present

## 2017-01-08 DIAGNOSIS — J101 Influenza due to other identified influenza virus with other respiratory manifestations: Secondary | ICD-10-CM | POA: Diagnosis not present

## 2017-01-08 DIAGNOSIS — S7292XD Unspecified fracture of left femur, subsequent encounter for closed fracture with routine healing: Secondary | ICD-10-CM | POA: Diagnosis not present

## 2017-01-08 DIAGNOSIS — I10 Essential (primary) hypertension: Secondary | ICD-10-CM | POA: Diagnosis not present

## 2017-01-08 DIAGNOSIS — D638 Anemia in other chronic diseases classified elsewhere: Secondary | ICD-10-CM | POA: Diagnosis not present

## 2017-01-08 DIAGNOSIS — W0110XD Fall on same level from slipping, tripping and stumbling with subsequent striking against unspecified object, subsequent encounter: Secondary | ICD-10-CM | POA: Diagnosis not present

## 2017-01-10 DIAGNOSIS — J101 Influenza due to other identified influenza virus with other respiratory manifestations: Secondary | ICD-10-CM | POA: Diagnosis not present

## 2017-01-10 DIAGNOSIS — W0110XD Fall on same level from slipping, tripping and stumbling with subsequent striking against unspecified object, subsequent encounter: Secondary | ICD-10-CM | POA: Diagnosis not present

## 2017-01-10 DIAGNOSIS — D638 Anemia in other chronic diseases classified elsewhere: Secondary | ICD-10-CM | POA: Diagnosis not present

## 2017-01-10 DIAGNOSIS — I10 Essential (primary) hypertension: Secondary | ICD-10-CM | POA: Diagnosis not present

## 2017-01-10 DIAGNOSIS — S7292XD Unspecified fracture of left femur, subsequent encounter for closed fracture with routine healing: Secondary | ICD-10-CM | POA: Diagnosis not present

## 2017-01-10 DIAGNOSIS — G309 Alzheimer's disease, unspecified: Secondary | ICD-10-CM | POA: Diagnosis not present

## 2017-01-10 DIAGNOSIS — G9009 Other idiopathic peripheral autonomic neuropathy: Secondary | ICD-10-CM | POA: Diagnosis not present

## 2017-01-10 DIAGNOSIS — E039 Hypothyroidism, unspecified: Secondary | ICD-10-CM | POA: Diagnosis not present

## 2017-01-10 DIAGNOSIS — Z7982 Long term (current) use of aspirin: Secondary | ICD-10-CM | POA: Diagnosis not present

## 2017-01-13 DIAGNOSIS — E039 Hypothyroidism, unspecified: Secondary | ICD-10-CM | POA: Diagnosis not present

## 2017-01-13 DIAGNOSIS — G309 Alzheimer's disease, unspecified: Secondary | ICD-10-CM | POA: Diagnosis not present

## 2017-01-13 DIAGNOSIS — J101 Influenza due to other identified influenza virus with other respiratory manifestations: Secondary | ICD-10-CM | POA: Diagnosis not present

## 2017-01-13 DIAGNOSIS — S7292XD Unspecified fracture of left femur, subsequent encounter for closed fracture with routine healing: Secondary | ICD-10-CM | POA: Diagnosis not present

## 2017-01-13 DIAGNOSIS — G9009 Other idiopathic peripheral autonomic neuropathy: Secondary | ICD-10-CM | POA: Diagnosis not present

## 2017-01-13 DIAGNOSIS — D638 Anemia in other chronic diseases classified elsewhere: Secondary | ICD-10-CM | POA: Diagnosis not present

## 2017-01-13 DIAGNOSIS — I10 Essential (primary) hypertension: Secondary | ICD-10-CM | POA: Diagnosis not present

## 2017-01-13 DIAGNOSIS — Z7982 Long term (current) use of aspirin: Secondary | ICD-10-CM | POA: Diagnosis not present

## 2017-01-13 DIAGNOSIS — W0110XD Fall on same level from slipping, tripping and stumbling with subsequent striking against unspecified object, subsequent encounter: Secondary | ICD-10-CM | POA: Diagnosis not present

## 2017-01-14 DIAGNOSIS — R1311 Dysphagia, oral phase: Secondary | ICD-10-CM | POA: Diagnosis not present

## 2017-01-14 DIAGNOSIS — J101 Influenza due to other identified influenza virus with other respiratory manifestations: Secondary | ICD-10-CM | POA: Diagnosis not present

## 2017-01-14 DIAGNOSIS — F339 Major depressive disorder, recurrent, unspecified: Secondary | ICD-10-CM | POA: Diagnosis not present

## 2017-01-14 DIAGNOSIS — G609 Hereditary and idiopathic neuropathy, unspecified: Secondary | ICD-10-CM | POA: Diagnosis not present

## 2017-01-14 DIAGNOSIS — R278 Other lack of coordination: Secondary | ICD-10-CM | POA: Diagnosis not present

## 2017-01-14 DIAGNOSIS — E871 Hypo-osmolality and hyponatremia: Secondary | ICD-10-CM | POA: Diagnosis not present

## 2017-01-14 DIAGNOSIS — W19XXXD Unspecified fall, subsequent encounter: Secondary | ICD-10-CM | POA: Diagnosis not present

## 2017-01-14 DIAGNOSIS — I10 Essential (primary) hypertension: Secondary | ICD-10-CM | POA: Diagnosis not present

## 2017-01-14 DIAGNOSIS — R296 Repeated falls: Secondary | ICD-10-CM | POA: Diagnosis not present

## 2017-01-14 DIAGNOSIS — D649 Anemia, unspecified: Secondary | ICD-10-CM | POA: Diagnosis not present

## 2017-01-14 DIAGNOSIS — R1314 Dysphagia, pharyngoesophageal phase: Secondary | ICD-10-CM | POA: Diagnosis not present

## 2017-01-14 DIAGNOSIS — K219 Gastro-esophageal reflux disease without esophagitis: Secondary | ICD-10-CM | POA: Diagnosis not present

## 2017-01-14 DIAGNOSIS — M6281 Muscle weakness (generalized): Secondary | ICD-10-CM | POA: Diagnosis not present

## 2017-01-14 DIAGNOSIS — E039 Hypothyroidism, unspecified: Secondary | ICD-10-CM | POA: Diagnosis not present

## 2017-01-14 DIAGNOSIS — S728X2D Other fracture of left femur, subsequent encounter for closed fracture with routine healing: Secondary | ICD-10-CM | POA: Diagnosis not present

## 2017-01-14 DIAGNOSIS — F0280 Dementia in other diseases classified elsewhere without behavioral disturbance: Secondary | ICD-10-CM | POA: Diagnosis not present

## 2017-01-14 DIAGNOSIS — R2689 Other abnormalities of gait and mobility: Secondary | ICD-10-CM | POA: Diagnosis not present

## 2017-01-15 DIAGNOSIS — E871 Hypo-osmolality and hyponatremia: Secondary | ICD-10-CM | POA: Diagnosis not present

## 2017-01-15 DIAGNOSIS — S72002D Fracture of unspecified part of neck of left femur, subsequent encounter for closed fracture with routine healing: Secondary | ICD-10-CM | POA: Diagnosis not present

## 2017-01-28 DIAGNOSIS — E871 Hypo-osmolality and hyponatremia: Secondary | ICD-10-CM | POA: Diagnosis not present

## 2017-01-28 DIAGNOSIS — S72002D Fracture of unspecified part of neck of left femur, subsequent encounter for closed fracture with routine healing: Secondary | ICD-10-CM | POA: Diagnosis not present

## 2017-02-03 DIAGNOSIS — D638 Anemia in other chronic diseases classified elsewhere: Secondary | ICD-10-CM | POA: Diagnosis not present

## 2017-02-03 DIAGNOSIS — I10 Essential (primary) hypertension: Secondary | ICD-10-CM | POA: Diagnosis not present

## 2017-02-03 DIAGNOSIS — E039 Hypothyroidism, unspecified: Secondary | ICD-10-CM | POA: Diagnosis not present

## 2017-02-03 DIAGNOSIS — W0110XD Fall on same level from slipping, tripping and stumbling with subsequent striking against unspecified object, subsequent encounter: Secondary | ICD-10-CM | POA: Diagnosis not present

## 2017-02-03 DIAGNOSIS — Z7982 Long term (current) use of aspirin: Secondary | ICD-10-CM | POA: Diagnosis not present

## 2017-02-03 DIAGNOSIS — G309 Alzheimer's disease, unspecified: Secondary | ICD-10-CM | POA: Diagnosis not present

## 2017-02-03 DIAGNOSIS — S7292XD Unspecified fracture of left femur, subsequent encounter for closed fracture with routine healing: Secondary | ICD-10-CM | POA: Diagnosis not present

## 2017-02-03 DIAGNOSIS — J101 Influenza due to other identified influenza virus with other respiratory manifestations: Secondary | ICD-10-CM | POA: Diagnosis not present

## 2017-02-03 DIAGNOSIS — G9009 Other idiopathic peripheral autonomic neuropathy: Secondary | ICD-10-CM | POA: Diagnosis not present

## 2017-02-05 DIAGNOSIS — E039 Hypothyroidism, unspecified: Secondary | ICD-10-CM | POA: Diagnosis not present

## 2017-02-05 DIAGNOSIS — J101 Influenza due to other identified influenza virus with other respiratory manifestations: Secondary | ICD-10-CM | POA: Diagnosis not present

## 2017-02-05 DIAGNOSIS — D638 Anemia in other chronic diseases classified elsewhere: Secondary | ICD-10-CM | POA: Diagnosis not present

## 2017-02-05 DIAGNOSIS — G309 Alzheimer's disease, unspecified: Secondary | ICD-10-CM | POA: Diagnosis not present

## 2017-02-05 DIAGNOSIS — S7292XD Unspecified fracture of left femur, subsequent encounter for closed fracture with routine healing: Secondary | ICD-10-CM | POA: Diagnosis not present

## 2017-02-05 DIAGNOSIS — G9009 Other idiopathic peripheral autonomic neuropathy: Secondary | ICD-10-CM | POA: Diagnosis not present

## 2017-02-05 DIAGNOSIS — I10 Essential (primary) hypertension: Secondary | ICD-10-CM | POA: Diagnosis not present

## 2017-02-05 DIAGNOSIS — W0110XD Fall on same level from slipping, tripping and stumbling with subsequent striking against unspecified object, subsequent encounter: Secondary | ICD-10-CM | POA: Diagnosis not present

## 2017-02-05 DIAGNOSIS — Z7982 Long term (current) use of aspirin: Secondary | ICD-10-CM | POA: Diagnosis not present

## 2017-02-06 DIAGNOSIS — Z7982 Long term (current) use of aspirin: Secondary | ICD-10-CM | POA: Diagnosis not present

## 2017-02-06 DIAGNOSIS — W0110XD Fall on same level from slipping, tripping and stumbling with subsequent striking against unspecified object, subsequent encounter: Secondary | ICD-10-CM | POA: Diagnosis not present

## 2017-02-06 DIAGNOSIS — G9009 Other idiopathic peripheral autonomic neuropathy: Secondary | ICD-10-CM | POA: Diagnosis not present

## 2017-02-06 DIAGNOSIS — J101 Influenza due to other identified influenza virus with other respiratory manifestations: Secondary | ICD-10-CM | POA: Diagnosis not present

## 2017-02-06 DIAGNOSIS — D638 Anemia in other chronic diseases classified elsewhere: Secondary | ICD-10-CM | POA: Diagnosis not present

## 2017-02-06 DIAGNOSIS — G309 Alzheimer's disease, unspecified: Secondary | ICD-10-CM | POA: Diagnosis not present

## 2017-02-06 DIAGNOSIS — S7292XD Unspecified fracture of left femur, subsequent encounter for closed fracture with routine healing: Secondary | ICD-10-CM | POA: Diagnosis not present

## 2017-02-06 DIAGNOSIS — E039 Hypothyroidism, unspecified: Secondary | ICD-10-CM | POA: Diagnosis not present

## 2017-02-06 DIAGNOSIS — I10 Essential (primary) hypertension: Secondary | ICD-10-CM | POA: Diagnosis not present

## 2017-02-07 DIAGNOSIS — M84352A Stress fracture, left femur, initial encounter for fracture: Secondary | ICD-10-CM | POA: Diagnosis not present

## 2017-02-07 DIAGNOSIS — G3184 Mild cognitive impairment, so stated: Secondary | ICD-10-CM | POA: Diagnosis not present

## 2017-02-10 DIAGNOSIS — D638 Anemia in other chronic diseases classified elsewhere: Secondary | ICD-10-CM | POA: Diagnosis not present

## 2017-02-10 DIAGNOSIS — E039 Hypothyroidism, unspecified: Secondary | ICD-10-CM | POA: Diagnosis not present

## 2017-02-10 DIAGNOSIS — W0110XD Fall on same level from slipping, tripping and stumbling with subsequent striking against unspecified object, subsequent encounter: Secondary | ICD-10-CM | POA: Diagnosis not present

## 2017-02-10 DIAGNOSIS — G309 Alzheimer's disease, unspecified: Secondary | ICD-10-CM | POA: Diagnosis not present

## 2017-02-10 DIAGNOSIS — I10 Essential (primary) hypertension: Secondary | ICD-10-CM | POA: Diagnosis not present

## 2017-02-10 DIAGNOSIS — Z7982 Long term (current) use of aspirin: Secondary | ICD-10-CM | POA: Diagnosis not present

## 2017-02-10 DIAGNOSIS — S7292XD Unspecified fracture of left femur, subsequent encounter for closed fracture with routine healing: Secondary | ICD-10-CM | POA: Diagnosis not present

## 2017-02-10 DIAGNOSIS — G9009 Other idiopathic peripheral autonomic neuropathy: Secondary | ICD-10-CM | POA: Diagnosis not present

## 2017-02-10 DIAGNOSIS — J101 Influenza due to other identified influenza virus with other respiratory manifestations: Secondary | ICD-10-CM | POA: Diagnosis not present

## 2017-02-11 DIAGNOSIS — W0110XD Fall on same level from slipping, tripping and stumbling with subsequent striking against unspecified object, subsequent encounter: Secondary | ICD-10-CM | POA: Diagnosis not present

## 2017-02-11 DIAGNOSIS — D638 Anemia in other chronic diseases classified elsewhere: Secondary | ICD-10-CM | POA: Diagnosis not present

## 2017-02-11 DIAGNOSIS — E039 Hypothyroidism, unspecified: Secondary | ICD-10-CM | POA: Diagnosis not present

## 2017-02-11 DIAGNOSIS — S7292XD Unspecified fracture of left femur, subsequent encounter for closed fracture with routine healing: Secondary | ICD-10-CM | POA: Diagnosis not present

## 2017-02-11 DIAGNOSIS — G9009 Other idiopathic peripheral autonomic neuropathy: Secondary | ICD-10-CM | POA: Diagnosis not present

## 2017-02-11 DIAGNOSIS — I10 Essential (primary) hypertension: Secondary | ICD-10-CM | POA: Diagnosis not present

## 2017-02-11 DIAGNOSIS — Z7982 Long term (current) use of aspirin: Secondary | ICD-10-CM | POA: Diagnosis not present

## 2017-02-11 DIAGNOSIS — J101 Influenza due to other identified influenza virus with other respiratory manifestations: Secondary | ICD-10-CM | POA: Diagnosis not present

## 2017-02-11 DIAGNOSIS — G309 Alzheimer's disease, unspecified: Secondary | ICD-10-CM | POA: Diagnosis not present

## 2017-02-19 DIAGNOSIS — S72002A Fracture of unspecified part of neck of left femur, initial encounter for closed fracture: Secondary | ICD-10-CM | POA: Diagnosis not present

## 2017-02-20 DIAGNOSIS — D638 Anemia in other chronic diseases classified elsewhere: Secondary | ICD-10-CM | POA: Diagnosis not present

## 2017-02-20 DIAGNOSIS — Z7982 Long term (current) use of aspirin: Secondary | ICD-10-CM | POA: Diagnosis not present

## 2017-02-20 DIAGNOSIS — E039 Hypothyroidism, unspecified: Secondary | ICD-10-CM | POA: Diagnosis not present

## 2017-02-20 DIAGNOSIS — S7292XD Unspecified fracture of left femur, subsequent encounter for closed fracture with routine healing: Secondary | ICD-10-CM | POA: Diagnosis not present

## 2017-02-20 DIAGNOSIS — I10 Essential (primary) hypertension: Secondary | ICD-10-CM | POA: Diagnosis not present

## 2017-02-20 DIAGNOSIS — G309 Alzheimer's disease, unspecified: Secondary | ICD-10-CM | POA: Diagnosis not present

## 2017-02-20 DIAGNOSIS — W0110XD Fall on same level from slipping, tripping and stumbling with subsequent striking against unspecified object, subsequent encounter: Secondary | ICD-10-CM | POA: Diagnosis not present

## 2017-02-20 DIAGNOSIS — G9009 Other idiopathic peripheral autonomic neuropathy: Secondary | ICD-10-CM | POA: Diagnosis not present

## 2017-02-20 DIAGNOSIS — J101 Influenza due to other identified influenza virus with other respiratory manifestations: Secondary | ICD-10-CM | POA: Diagnosis not present

## 2017-02-24 DIAGNOSIS — Z7982 Long term (current) use of aspirin: Secondary | ICD-10-CM | POA: Diagnosis not present

## 2017-02-24 DIAGNOSIS — G9009 Other idiopathic peripheral autonomic neuropathy: Secondary | ICD-10-CM | POA: Diagnosis not present

## 2017-02-24 DIAGNOSIS — D638 Anemia in other chronic diseases classified elsewhere: Secondary | ICD-10-CM | POA: Diagnosis not present

## 2017-02-24 DIAGNOSIS — I10 Essential (primary) hypertension: Secondary | ICD-10-CM | POA: Diagnosis not present

## 2017-02-24 DIAGNOSIS — J101 Influenza due to other identified influenza virus with other respiratory manifestations: Secondary | ICD-10-CM | POA: Diagnosis not present

## 2017-02-24 DIAGNOSIS — G309 Alzheimer's disease, unspecified: Secondary | ICD-10-CM | POA: Diagnosis not present

## 2017-02-24 DIAGNOSIS — S7292XD Unspecified fracture of left femur, subsequent encounter for closed fracture with routine healing: Secondary | ICD-10-CM | POA: Diagnosis not present

## 2017-02-24 DIAGNOSIS — E039 Hypothyroidism, unspecified: Secondary | ICD-10-CM | POA: Diagnosis not present

## 2017-02-24 DIAGNOSIS — W0110XD Fall on same level from slipping, tripping and stumbling with subsequent striking against unspecified object, subsequent encounter: Secondary | ICD-10-CM | POA: Diagnosis not present

## 2017-02-27 DIAGNOSIS — S7292XD Unspecified fracture of left femur, subsequent encounter for closed fracture with routine healing: Secondary | ICD-10-CM | POA: Diagnosis not present

## 2017-02-27 DIAGNOSIS — E039 Hypothyroidism, unspecified: Secondary | ICD-10-CM | POA: Diagnosis not present

## 2017-02-27 DIAGNOSIS — G9009 Other idiopathic peripheral autonomic neuropathy: Secondary | ICD-10-CM | POA: Diagnosis not present

## 2017-02-27 DIAGNOSIS — Z7982 Long term (current) use of aspirin: Secondary | ICD-10-CM | POA: Diagnosis not present

## 2017-02-27 DIAGNOSIS — G309 Alzheimer's disease, unspecified: Secondary | ICD-10-CM | POA: Diagnosis not present

## 2017-02-27 DIAGNOSIS — J101 Influenza due to other identified influenza virus with other respiratory manifestations: Secondary | ICD-10-CM | POA: Diagnosis not present

## 2017-02-27 DIAGNOSIS — D638 Anemia in other chronic diseases classified elsewhere: Secondary | ICD-10-CM | POA: Diagnosis not present

## 2017-02-27 DIAGNOSIS — W0110XD Fall on same level from slipping, tripping and stumbling with subsequent striking against unspecified object, subsequent encounter: Secondary | ICD-10-CM | POA: Diagnosis not present

## 2017-02-27 DIAGNOSIS — I10 Essential (primary) hypertension: Secondary | ICD-10-CM | POA: Diagnosis not present

## 2017-03-04 DIAGNOSIS — W0110XD Fall on same level from slipping, tripping and stumbling with subsequent striking against unspecified object, subsequent encounter: Secondary | ICD-10-CM | POA: Diagnosis not present

## 2017-03-04 DIAGNOSIS — E039 Hypothyroidism, unspecified: Secondary | ICD-10-CM | POA: Diagnosis not present

## 2017-03-04 DIAGNOSIS — D638 Anemia in other chronic diseases classified elsewhere: Secondary | ICD-10-CM | POA: Diagnosis not present

## 2017-03-04 DIAGNOSIS — J101 Influenza due to other identified influenza virus with other respiratory manifestations: Secondary | ICD-10-CM | POA: Diagnosis not present

## 2017-03-04 DIAGNOSIS — Z7982 Long term (current) use of aspirin: Secondary | ICD-10-CM | POA: Diagnosis not present

## 2017-03-04 DIAGNOSIS — S7292XD Unspecified fracture of left femur, subsequent encounter for closed fracture with routine healing: Secondary | ICD-10-CM | POA: Diagnosis not present

## 2017-03-04 DIAGNOSIS — I10 Essential (primary) hypertension: Secondary | ICD-10-CM | POA: Diagnosis not present

## 2017-03-04 DIAGNOSIS — G309 Alzheimer's disease, unspecified: Secondary | ICD-10-CM | POA: Diagnosis not present

## 2017-03-04 DIAGNOSIS — G9009 Other idiopathic peripheral autonomic neuropathy: Secondary | ICD-10-CM | POA: Diagnosis not present

## 2017-03-09 DIAGNOSIS — M84352A Stress fracture, left femur, initial encounter for fracture: Secondary | ICD-10-CM | POA: Diagnosis not present

## 2017-03-09 DIAGNOSIS — G3184 Mild cognitive impairment, so stated: Secondary | ICD-10-CM | POA: Diagnosis not present

## 2017-03-21 DIAGNOSIS — S72442D Displaced fracture of lower epiphysis (separation) of left femur, subsequent encounter for closed fracture with routine healing: Secondary | ICD-10-CM | POA: Diagnosis not present

## 2017-03-21 DIAGNOSIS — S72002A Fracture of unspecified part of neck of left femur, initial encounter for closed fracture: Secondary | ICD-10-CM | POA: Diagnosis not present

## 2017-03-21 DIAGNOSIS — X58XXXA Exposure to other specified factors, initial encounter: Secondary | ICD-10-CM | POA: Diagnosis not present

## 2017-03-28 ENCOUNTER — Ambulatory Visit: Payer: Self-pay | Admitting: Urology

## 2017-04-02 DIAGNOSIS — F331 Major depressive disorder, recurrent, moderate: Secondary | ICD-10-CM | POA: Diagnosis not present

## 2017-04-02 DIAGNOSIS — K219 Gastro-esophageal reflux disease without esophagitis: Secondary | ICD-10-CM | POA: Diagnosis not present

## 2017-04-02 DIAGNOSIS — G301 Alzheimer's disease with late onset: Secondary | ICD-10-CM | POA: Diagnosis not present

## 2017-04-02 DIAGNOSIS — E871 Hypo-osmolality and hyponatremia: Secondary | ICD-10-CM | POA: Diagnosis not present

## 2017-04-02 DIAGNOSIS — Z6827 Body mass index (BMI) 27.0-27.9, adult: Secondary | ICD-10-CM | POA: Diagnosis not present

## 2017-04-02 DIAGNOSIS — E039 Hypothyroidism, unspecified: Secondary | ICD-10-CM | POA: Diagnosis not present

## 2017-04-09 DIAGNOSIS — G3184 Mild cognitive impairment, so stated: Secondary | ICD-10-CM | POA: Diagnosis not present

## 2017-04-09 DIAGNOSIS — M84352A Stress fracture, left femur, initial encounter for fracture: Secondary | ICD-10-CM | POA: Diagnosis not present

## 2017-05-09 DIAGNOSIS — M84352A Stress fracture, left femur, initial encounter for fracture: Secondary | ICD-10-CM | POA: Diagnosis not present

## 2017-05-09 DIAGNOSIS — G3184 Mild cognitive impairment, so stated: Secondary | ICD-10-CM | POA: Diagnosis not present

## 2017-05-22 DIAGNOSIS — N401 Enlarged prostate with lower urinary tract symptoms: Secondary | ICD-10-CM | POA: Diagnosis not present

## 2017-05-22 DIAGNOSIS — R300 Dysuria: Secondary | ICD-10-CM | POA: Diagnosis not present

## 2017-05-22 DIAGNOSIS — Z6827 Body mass index (BMI) 27.0-27.9, adult: Secondary | ICD-10-CM | POA: Diagnosis not present

## 2017-05-22 DIAGNOSIS — N481 Balanitis: Secondary | ICD-10-CM | POA: Diagnosis not present

## 2017-06-09 DIAGNOSIS — M84352A Stress fracture, left femur, initial encounter for fracture: Secondary | ICD-10-CM | POA: Diagnosis not present

## 2017-06-09 DIAGNOSIS — G3184 Mild cognitive impairment, so stated: Secondary | ICD-10-CM | POA: Diagnosis not present

## 2017-06-25 DIAGNOSIS — S134XXA Sprain of ligaments of cervical spine, initial encounter: Secondary | ICD-10-CM | POA: Diagnosis not present

## 2017-06-25 DIAGNOSIS — M9901 Segmental and somatic dysfunction of cervical region: Secondary | ICD-10-CM | POA: Diagnosis not present

## 2017-06-25 DIAGNOSIS — S233XXA Sprain of ligaments of thoracic spine, initial encounter: Secondary | ICD-10-CM | POA: Diagnosis not present

## 2017-06-25 DIAGNOSIS — M47816 Spondylosis without myelopathy or radiculopathy, lumbar region: Secondary | ICD-10-CM | POA: Diagnosis not present

## 2017-06-25 DIAGNOSIS — S336XXA Sprain of sacroiliac joint, initial encounter: Secondary | ICD-10-CM | POA: Diagnosis not present

## 2017-06-25 DIAGNOSIS — M9903 Segmental and somatic dysfunction of lumbar region: Secondary | ICD-10-CM | POA: Diagnosis not present

## 2017-06-25 DIAGNOSIS — M9902 Segmental and somatic dysfunction of thoracic region: Secondary | ICD-10-CM | POA: Diagnosis not present

## 2017-06-25 DIAGNOSIS — S338XXA Sprain of other parts of lumbar spine and pelvis, initial encounter: Secondary | ICD-10-CM | POA: Diagnosis not present

## 2017-06-26 DIAGNOSIS — M9902 Segmental and somatic dysfunction of thoracic region: Secondary | ICD-10-CM | POA: Diagnosis not present

## 2017-06-26 DIAGNOSIS — M9901 Segmental and somatic dysfunction of cervical region: Secondary | ICD-10-CM | POA: Diagnosis not present

## 2017-06-26 DIAGNOSIS — M47816 Spondylosis without myelopathy or radiculopathy, lumbar region: Secondary | ICD-10-CM | POA: Diagnosis not present

## 2017-06-26 DIAGNOSIS — S338XXA Sprain of other parts of lumbar spine and pelvis, initial encounter: Secondary | ICD-10-CM | POA: Diagnosis not present

## 2017-06-26 DIAGNOSIS — S134XXA Sprain of ligaments of cervical spine, initial encounter: Secondary | ICD-10-CM | POA: Diagnosis not present

## 2017-06-26 DIAGNOSIS — M9903 Segmental and somatic dysfunction of lumbar region: Secondary | ICD-10-CM | POA: Diagnosis not present

## 2017-06-26 DIAGNOSIS — S233XXA Sprain of ligaments of thoracic spine, initial encounter: Secondary | ICD-10-CM | POA: Diagnosis not present

## 2017-06-26 DIAGNOSIS — S336XXA Sprain of sacroiliac joint, initial encounter: Secondary | ICD-10-CM | POA: Diagnosis not present

## 2017-06-30 DIAGNOSIS — S134XXA Sprain of ligaments of cervical spine, initial encounter: Secondary | ICD-10-CM | POA: Diagnosis not present

## 2017-06-30 DIAGNOSIS — S233XXA Sprain of ligaments of thoracic spine, initial encounter: Secondary | ICD-10-CM | POA: Diagnosis not present

## 2017-06-30 DIAGNOSIS — N400 Enlarged prostate without lower urinary tract symptoms: Secondary | ICD-10-CM | POA: Diagnosis not present

## 2017-06-30 DIAGNOSIS — M9902 Segmental and somatic dysfunction of thoracic region: Secondary | ICD-10-CM | POA: Diagnosis not present

## 2017-06-30 DIAGNOSIS — S336XXA Sprain of sacroiliac joint, initial encounter: Secondary | ICD-10-CM | POA: Diagnosis not present

## 2017-06-30 DIAGNOSIS — M9901 Segmental and somatic dysfunction of cervical region: Secondary | ICD-10-CM | POA: Diagnosis not present

## 2017-06-30 DIAGNOSIS — N3941 Urge incontinence: Secondary | ICD-10-CM | POA: Diagnosis not present

## 2017-06-30 DIAGNOSIS — S338XXA Sprain of other parts of lumbar spine and pelvis, initial encounter: Secondary | ICD-10-CM | POA: Diagnosis not present

## 2017-06-30 DIAGNOSIS — M47816 Spondylosis without myelopathy or radiculopathy, lumbar region: Secondary | ICD-10-CM | POA: Diagnosis not present

## 2017-06-30 DIAGNOSIS — M9903 Segmental and somatic dysfunction of lumbar region: Secondary | ICD-10-CM | POA: Diagnosis not present

## 2017-06-30 DIAGNOSIS — N3281 Overactive bladder: Secondary | ICD-10-CM | POA: Diagnosis not present

## 2017-07-18 DIAGNOSIS — G609 Hereditary and idiopathic neuropathy, unspecified: Secondary | ICD-10-CM | POA: Diagnosis not present

## 2017-07-18 DIAGNOSIS — N390 Urinary tract infection, site not specified: Secondary | ICD-10-CM | POA: Diagnosis not present

## 2017-07-18 DIAGNOSIS — Z887 Allergy status to serum and vaccine status: Secondary | ICD-10-CM | POA: Diagnosis not present

## 2017-07-18 DIAGNOSIS — I10 Essential (primary) hypertension: Secondary | ICD-10-CM | POA: Diagnosis not present

## 2017-07-18 DIAGNOSIS — G309 Alzheimer's disease, unspecified: Secondary | ICD-10-CM | POA: Diagnosis not present

## 2017-07-18 DIAGNOSIS — N400 Enlarged prostate without lower urinary tract symptoms: Secondary | ICD-10-CM | POA: Diagnosis not present

## 2017-07-18 DIAGNOSIS — K5909 Other constipation: Secondary | ICD-10-CM | POA: Diagnosis not present

## 2017-07-18 DIAGNOSIS — Z6828 Body mass index (BMI) 28.0-28.9, adult: Secondary | ICD-10-CM | POA: Diagnosis not present

## 2017-07-18 DIAGNOSIS — R319 Hematuria, unspecified: Secondary | ICD-10-CM | POA: Diagnosis not present

## 2017-07-18 DIAGNOSIS — E876 Hypokalemia: Secondary | ICD-10-CM | POA: Diagnosis not present

## 2017-07-18 DIAGNOSIS — A419 Sepsis, unspecified organism: Secondary | ICD-10-CM | POA: Diagnosis not present

## 2017-07-18 DIAGNOSIS — N3281 Overactive bladder: Secondary | ICD-10-CM | POA: Diagnosis not present

## 2017-07-18 DIAGNOSIS — F0280 Dementia in other diseases classified elsewhere without behavioral disturbance: Secondary | ICD-10-CM | POA: Diagnosis not present

## 2017-07-18 DIAGNOSIS — E039 Hypothyroidism, unspecified: Secondary | ICD-10-CM | POA: Diagnosis not present

## 2017-07-18 DIAGNOSIS — D649 Anemia, unspecified: Secondary | ICD-10-CM | POA: Diagnosis not present

## 2017-07-18 DIAGNOSIS — A4151 Sepsis due to Escherichia coli [E. coli]: Secondary | ICD-10-CM | POA: Diagnosis not present

## 2017-07-18 DIAGNOSIS — Z79899 Other long term (current) drug therapy: Secondary | ICD-10-CM | POA: Diagnosis not present

## 2017-07-18 DIAGNOSIS — E871 Hypo-osmolality and hyponatremia: Secondary | ICD-10-CM | POA: Diagnosis not present

## 2017-07-18 DIAGNOSIS — Z825 Family history of asthma and other chronic lower respiratory diseases: Secondary | ICD-10-CM | POA: Diagnosis not present

## 2017-07-18 DIAGNOSIS — N179 Acute kidney failure, unspecified: Secondary | ICD-10-CM | POA: Diagnosis not present

## 2017-07-18 DIAGNOSIS — N410 Acute prostatitis: Secondary | ICD-10-CM | POA: Diagnosis not present

## 2017-07-18 DIAGNOSIS — Z8249 Family history of ischemic heart disease and other diseases of the circulatory system: Secondary | ICD-10-CM | POA: Diagnosis not present

## 2017-07-18 DIAGNOSIS — F419 Anxiety disorder, unspecified: Secondary | ICD-10-CM | POA: Diagnosis not present

## 2017-07-18 DIAGNOSIS — F339 Major depressive disorder, recurrent, unspecified: Secondary | ICD-10-CM | POA: Diagnosis not present

## 2017-07-18 DIAGNOSIS — M1990 Unspecified osteoarthritis, unspecified site: Secondary | ICD-10-CM | POA: Diagnosis not present

## 2017-07-18 DIAGNOSIS — H9190 Unspecified hearing loss, unspecified ear: Secondary | ICD-10-CM | POA: Diagnosis not present

## 2017-07-24 DIAGNOSIS — I10 Essential (primary) hypertension: Secondary | ICD-10-CM | POA: Diagnosis not present

## 2017-07-24 DIAGNOSIS — B9620 Unspecified Escherichia coli [E. coli] as the cause of diseases classified elsewhere: Secondary | ICD-10-CM | POA: Diagnosis not present

## 2017-07-24 DIAGNOSIS — N3281 Overactive bladder: Secondary | ICD-10-CM | POA: Diagnosis not present

## 2017-07-24 DIAGNOSIS — A419 Sepsis, unspecified organism: Secondary | ICD-10-CM | POA: Diagnosis not present

## 2017-07-24 DIAGNOSIS — N400 Enlarged prostate without lower urinary tract symptoms: Secondary | ICD-10-CM | POA: Diagnosis not present

## 2017-07-24 DIAGNOSIS — G609 Hereditary and idiopathic neuropathy, unspecified: Secondary | ICD-10-CM | POA: Diagnosis not present

## 2017-07-24 DIAGNOSIS — D649 Anemia, unspecified: Secondary | ICD-10-CM | POA: Diagnosis not present

## 2017-07-24 DIAGNOSIS — H9190 Unspecified hearing loss, unspecified ear: Secondary | ICD-10-CM | POA: Diagnosis not present

## 2017-07-24 DIAGNOSIS — E039 Hypothyroidism, unspecified: Secondary | ICD-10-CM | POA: Diagnosis not present

## 2017-07-24 DIAGNOSIS — E871 Hypo-osmolality and hyponatremia: Secondary | ICD-10-CM | POA: Diagnosis not present

## 2017-07-24 DIAGNOSIS — N390 Urinary tract infection, site not specified: Secondary | ICD-10-CM | POA: Diagnosis not present

## 2017-07-24 DIAGNOSIS — Z7982 Long term (current) use of aspirin: Secondary | ICD-10-CM | POA: Diagnosis not present

## 2017-07-24 DIAGNOSIS — K5909 Other constipation: Secondary | ICD-10-CM | POA: Diagnosis not present

## 2017-08-06 DIAGNOSIS — Z6827 Body mass index (BMI) 27.0-27.9, adult: Secondary | ICD-10-CM | POA: Diagnosis not present

## 2017-08-06 DIAGNOSIS — E871 Hypo-osmolality and hyponatremia: Secondary | ICD-10-CM | POA: Diagnosis not present

## 2017-08-06 DIAGNOSIS — K219 Gastro-esophageal reflux disease without esophagitis: Secondary | ICD-10-CM | POA: Diagnosis not present

## 2017-08-06 DIAGNOSIS — Z1389 Encounter for screening for other disorder: Secondary | ICD-10-CM | POA: Diagnosis not present

## 2017-08-06 DIAGNOSIS — Z6826 Body mass index (BMI) 26.0-26.9, adult: Secondary | ICD-10-CM | POA: Diagnosis not present

## 2017-08-06 DIAGNOSIS — G301 Alzheimer's disease with late onset: Secondary | ICD-10-CM | POA: Diagnosis not present

## 2017-08-06 DIAGNOSIS — F331 Major depressive disorder, recurrent, moderate: Secondary | ICD-10-CM | POA: Diagnosis not present

## 2017-08-06 DIAGNOSIS — E039 Hypothyroidism, unspecified: Secondary | ICD-10-CM | POA: Diagnosis not present

## 2017-08-14 DIAGNOSIS — Z08 Encounter for follow-up examination after completed treatment for malignant neoplasm: Secondary | ICD-10-CM | POA: Diagnosis not present

## 2017-08-14 DIAGNOSIS — Z85820 Personal history of malignant melanoma of skin: Secondary | ICD-10-CM | POA: Diagnosis not present

## 2017-08-14 DIAGNOSIS — Z1283 Encounter for screening for malignant neoplasm of skin: Secondary | ICD-10-CM | POA: Diagnosis not present

## 2017-09-05 DIAGNOSIS — D649 Anemia, unspecified: Secondary | ICD-10-CM | POA: Diagnosis not present

## 2017-09-05 DIAGNOSIS — E876 Hypokalemia: Secondary | ICD-10-CM | POA: Diagnosis not present

## 2017-09-05 DIAGNOSIS — D519 Vitamin B12 deficiency anemia, unspecified: Secondary | ICD-10-CM | POA: Diagnosis not present

## 2017-09-05 DIAGNOSIS — E875 Hyperkalemia: Secondary | ICD-10-CM | POA: Diagnosis not present

## 2017-09-05 DIAGNOSIS — D509 Iron deficiency anemia, unspecified: Secondary | ICD-10-CM | POA: Diagnosis not present

## 2017-09-05 DIAGNOSIS — E039 Hypothyroidism, unspecified: Secondary | ICD-10-CM | POA: Diagnosis not present

## 2017-09-05 DIAGNOSIS — Z9189 Other specified personal risk factors, not elsewhere classified: Secondary | ICD-10-CM | POA: Diagnosis not present

## 2017-09-05 DIAGNOSIS — E871 Hypo-osmolality and hyponatremia: Secondary | ICD-10-CM | POA: Diagnosis not present

## 2017-09-05 DIAGNOSIS — F331 Major depressive disorder, recurrent, moderate: Secondary | ICD-10-CM | POA: Diagnosis not present

## 2017-09-05 DIAGNOSIS — K210 Gastro-esophageal reflux disease with esophagitis: Secondary | ICD-10-CM | POA: Diagnosis not present

## 2017-09-13 DIAGNOSIS — N401 Enlarged prostate with lower urinary tract symptoms: Secondary | ICD-10-CM | POA: Diagnosis not present

## 2017-09-13 DIAGNOSIS — Z0001 Encounter for general adult medical examination with abnormal findings: Secondary | ICD-10-CM | POA: Diagnosis not present

## 2017-09-13 DIAGNOSIS — E039 Hypothyroidism, unspecified: Secondary | ICD-10-CM | POA: Diagnosis not present

## 2017-09-13 DIAGNOSIS — G301 Alzheimer's disease with late onset: Secondary | ICD-10-CM | POA: Diagnosis not present

## 2017-09-13 DIAGNOSIS — I10 Essential (primary) hypertension: Secondary | ICD-10-CM | POA: Diagnosis not present

## 2017-09-13 DIAGNOSIS — D500 Iron deficiency anemia secondary to blood loss (chronic): Secondary | ICD-10-CM | POA: Diagnosis not present

## 2017-09-13 DIAGNOSIS — Z6826 Body mass index (BMI) 26.0-26.9, adult: Secondary | ICD-10-CM | POA: Diagnosis not present

## 2017-09-13 DIAGNOSIS — E871 Hypo-osmolality and hyponatremia: Secondary | ICD-10-CM | POA: Diagnosis not present

## 2017-09-19 DIAGNOSIS — S233XXA Sprain of ligaments of thoracic spine, initial encounter: Secondary | ICD-10-CM | POA: Diagnosis not present

## 2017-09-19 DIAGNOSIS — M9902 Segmental and somatic dysfunction of thoracic region: Secondary | ICD-10-CM | POA: Diagnosis not present

## 2017-09-19 DIAGNOSIS — M9903 Segmental and somatic dysfunction of lumbar region: Secondary | ICD-10-CM | POA: Diagnosis not present

## 2017-09-19 DIAGNOSIS — M9901 Segmental and somatic dysfunction of cervical region: Secondary | ICD-10-CM | POA: Diagnosis not present

## 2017-09-19 DIAGNOSIS — M47816 Spondylosis without myelopathy or radiculopathy, lumbar region: Secondary | ICD-10-CM | POA: Diagnosis not present

## 2017-09-19 DIAGNOSIS — S134XXA Sprain of ligaments of cervical spine, initial encounter: Secondary | ICD-10-CM | POA: Diagnosis not present

## 2017-09-19 DIAGNOSIS — S338XXA Sprain of other parts of lumbar spine and pelvis, initial encounter: Secondary | ICD-10-CM | POA: Diagnosis not present

## 2017-09-19 DIAGNOSIS — S336XXA Sprain of sacroiliac joint, initial encounter: Secondary | ICD-10-CM | POA: Diagnosis not present

## 2017-10-01 ENCOUNTER — Ambulatory Visit: Payer: PPO | Admitting: Urology

## 2017-10-01 DIAGNOSIS — N3281 Overactive bladder: Secondary | ICD-10-CM

## 2017-10-01 DIAGNOSIS — N401 Enlarged prostate with lower urinary tract symptoms: Secondary | ICD-10-CM | POA: Diagnosis not present

## 2017-10-23 DIAGNOSIS — Z6826 Body mass index (BMI) 26.0-26.9, adult: Secondary | ICD-10-CM | POA: Diagnosis not present

## 2017-10-23 DIAGNOSIS — I951 Orthostatic hypotension: Secondary | ICD-10-CM | POA: Diagnosis not present

## 2017-11-12 ENCOUNTER — Ambulatory Visit: Payer: PPO | Admitting: Urology

## 2017-11-12 DIAGNOSIS — N3281 Overactive bladder: Secondary | ICD-10-CM

## 2017-11-12 DIAGNOSIS — N401 Enlarged prostate with lower urinary tract symptoms: Secondary | ICD-10-CM

## 2017-11-14 DIAGNOSIS — S134XXA Sprain of ligaments of cervical spine, initial encounter: Secondary | ICD-10-CM | POA: Diagnosis not present

## 2017-11-14 DIAGNOSIS — S338XXA Sprain of other parts of lumbar spine and pelvis, initial encounter: Secondary | ICD-10-CM | POA: Diagnosis not present

## 2017-11-14 DIAGNOSIS — S233XXA Sprain of ligaments of thoracic spine, initial encounter: Secondary | ICD-10-CM | POA: Diagnosis not present

## 2017-11-14 DIAGNOSIS — M9902 Segmental and somatic dysfunction of thoracic region: Secondary | ICD-10-CM | POA: Diagnosis not present

## 2017-11-14 DIAGNOSIS — M9901 Segmental and somatic dysfunction of cervical region: Secondary | ICD-10-CM | POA: Diagnosis not present

## 2017-11-14 DIAGNOSIS — M9903 Segmental and somatic dysfunction of lumbar region: Secondary | ICD-10-CM | POA: Diagnosis not present

## 2017-11-26 DIAGNOSIS — M9901 Segmental and somatic dysfunction of cervical region: Secondary | ICD-10-CM | POA: Diagnosis not present

## 2017-11-26 DIAGNOSIS — S134XXA Sprain of ligaments of cervical spine, initial encounter: Secondary | ICD-10-CM | POA: Diagnosis not present

## 2017-11-26 DIAGNOSIS — S338XXA Sprain of other parts of lumbar spine and pelvis, initial encounter: Secondary | ICD-10-CM | POA: Diagnosis not present

## 2017-11-26 DIAGNOSIS — M9902 Segmental and somatic dysfunction of thoracic region: Secondary | ICD-10-CM | POA: Diagnosis not present

## 2017-11-26 DIAGNOSIS — M9903 Segmental and somatic dysfunction of lumbar region: Secondary | ICD-10-CM | POA: Diagnosis not present

## 2017-11-26 DIAGNOSIS — S233XXA Sprain of ligaments of thoracic spine, initial encounter: Secondary | ICD-10-CM | POA: Diagnosis not present

## 2017-11-28 DIAGNOSIS — M9902 Segmental and somatic dysfunction of thoracic region: Secondary | ICD-10-CM | POA: Diagnosis not present

## 2017-11-28 DIAGNOSIS — M9901 Segmental and somatic dysfunction of cervical region: Secondary | ICD-10-CM | POA: Diagnosis not present

## 2017-11-28 DIAGNOSIS — S134XXA Sprain of ligaments of cervical spine, initial encounter: Secondary | ICD-10-CM | POA: Diagnosis not present

## 2017-11-28 DIAGNOSIS — S233XXA Sprain of ligaments of thoracic spine, initial encounter: Secondary | ICD-10-CM | POA: Diagnosis not present

## 2017-11-28 DIAGNOSIS — M9903 Segmental and somatic dysfunction of lumbar region: Secondary | ICD-10-CM | POA: Diagnosis not present

## 2017-11-28 DIAGNOSIS — S338XXA Sprain of other parts of lumbar spine and pelvis, initial encounter: Secondary | ICD-10-CM | POA: Diagnosis not present

## 2017-12-05 DIAGNOSIS — S338XXA Sprain of other parts of lumbar spine and pelvis, initial encounter: Secondary | ICD-10-CM | POA: Diagnosis not present

## 2017-12-05 DIAGNOSIS — S233XXA Sprain of ligaments of thoracic spine, initial encounter: Secondary | ICD-10-CM | POA: Diagnosis not present

## 2017-12-05 DIAGNOSIS — M9901 Segmental and somatic dysfunction of cervical region: Secondary | ICD-10-CM | POA: Diagnosis not present

## 2017-12-05 DIAGNOSIS — M9903 Segmental and somatic dysfunction of lumbar region: Secondary | ICD-10-CM | POA: Diagnosis not present

## 2017-12-05 DIAGNOSIS — M9902 Segmental and somatic dysfunction of thoracic region: Secondary | ICD-10-CM | POA: Diagnosis not present

## 2017-12-05 DIAGNOSIS — S134XXA Sprain of ligaments of cervical spine, initial encounter: Secondary | ICD-10-CM | POA: Diagnosis not present

## 2017-12-10 DIAGNOSIS — S134XXA Sprain of ligaments of cervical spine, initial encounter: Secondary | ICD-10-CM | POA: Diagnosis not present

## 2017-12-10 DIAGNOSIS — S233XXA Sprain of ligaments of thoracic spine, initial encounter: Secondary | ICD-10-CM | POA: Diagnosis not present

## 2017-12-10 DIAGNOSIS — S338XXA Sprain of other parts of lumbar spine and pelvis, initial encounter: Secondary | ICD-10-CM | POA: Diagnosis not present

## 2017-12-10 DIAGNOSIS — M9903 Segmental and somatic dysfunction of lumbar region: Secondary | ICD-10-CM | POA: Diagnosis not present

## 2017-12-10 DIAGNOSIS — M9902 Segmental and somatic dysfunction of thoracic region: Secondary | ICD-10-CM | POA: Diagnosis not present

## 2017-12-10 DIAGNOSIS — M9901 Segmental and somatic dysfunction of cervical region: Secondary | ICD-10-CM | POA: Diagnosis not present

## 2017-12-18 DIAGNOSIS — M9901 Segmental and somatic dysfunction of cervical region: Secondary | ICD-10-CM | POA: Diagnosis not present

## 2017-12-18 DIAGNOSIS — S134XXA Sprain of ligaments of cervical spine, initial encounter: Secondary | ICD-10-CM | POA: Diagnosis not present

## 2017-12-18 DIAGNOSIS — S233XXA Sprain of ligaments of thoracic spine, initial encounter: Secondary | ICD-10-CM | POA: Diagnosis not present

## 2017-12-18 DIAGNOSIS — M9903 Segmental and somatic dysfunction of lumbar region: Secondary | ICD-10-CM | POA: Diagnosis not present

## 2017-12-18 DIAGNOSIS — S338XXA Sprain of other parts of lumbar spine and pelvis, initial encounter: Secondary | ICD-10-CM | POA: Diagnosis not present

## 2017-12-18 DIAGNOSIS — M9902 Segmental and somatic dysfunction of thoracic region: Secondary | ICD-10-CM | POA: Diagnosis not present

## 2017-12-24 ENCOUNTER — Ambulatory Visit: Payer: PPO | Admitting: Urology

## 2017-12-24 DIAGNOSIS — N401 Enlarged prostate with lower urinary tract symptoms: Secondary | ICD-10-CM | POA: Diagnosis not present

## 2017-12-24 DIAGNOSIS — N3281 Overactive bladder: Secondary | ICD-10-CM

## 2018-01-15 DIAGNOSIS — E039 Hypothyroidism, unspecified: Secondary | ICD-10-CM | POA: Diagnosis not present

## 2018-01-15 DIAGNOSIS — Z6826 Body mass index (BMI) 26.0-26.9, adult: Secondary | ICD-10-CM | POA: Diagnosis not present

## 2018-01-15 DIAGNOSIS — K219 Gastro-esophageal reflux disease without esophagitis: Secondary | ICD-10-CM | POA: Diagnosis not present

## 2018-01-15 DIAGNOSIS — R300 Dysuria: Secondary | ICD-10-CM | POA: Diagnosis not present

## 2018-01-15 DIAGNOSIS — D500 Iron deficiency anemia secondary to blood loss (chronic): Secondary | ICD-10-CM | POA: Diagnosis not present

## 2018-01-15 DIAGNOSIS — N401 Enlarged prostate with lower urinary tract symptoms: Secondary | ICD-10-CM | POA: Diagnosis not present

## 2018-01-15 DIAGNOSIS — G301 Alzheimer's disease with late onset: Secondary | ICD-10-CM | POA: Diagnosis not present

## 2018-01-15 DIAGNOSIS — N3000 Acute cystitis without hematuria: Secondary | ICD-10-CM | POA: Diagnosis not present

## 2018-01-15 DIAGNOSIS — I10 Essential (primary) hypertension: Secondary | ICD-10-CM | POA: Diagnosis not present

## 2018-01-15 DIAGNOSIS — Z0001 Encounter for general adult medical examination with abnormal findings: Secondary | ICD-10-CM | POA: Diagnosis not present

## 2018-01-15 DIAGNOSIS — Z1212 Encounter for screening for malignant neoplasm of rectum: Secondary | ICD-10-CM | POA: Diagnosis not present

## 2018-01-15 DIAGNOSIS — E871 Hypo-osmolality and hyponatremia: Secondary | ICD-10-CM | POA: Diagnosis not present

## 2018-01-15 DIAGNOSIS — F331 Major depressive disorder, recurrent, moderate: Secondary | ICD-10-CM | POA: Diagnosis not present

## 2018-01-19 DIAGNOSIS — Z9189 Other specified personal risk factors, not elsewhere classified: Secondary | ICD-10-CM | POA: Diagnosis not present

## 2018-01-19 DIAGNOSIS — D500 Iron deficiency anemia secondary to blood loss (chronic): Secondary | ICD-10-CM | POA: Diagnosis not present

## 2018-01-19 DIAGNOSIS — K210 Gastro-esophageal reflux disease with esophagitis: Secondary | ICD-10-CM | POA: Diagnosis not present

## 2018-01-19 DIAGNOSIS — I10 Essential (primary) hypertension: Secondary | ICD-10-CM | POA: Diagnosis not present

## 2018-01-19 DIAGNOSIS — E876 Hypokalemia: Secondary | ICD-10-CM | POA: Diagnosis not present

## 2018-01-19 DIAGNOSIS — E871 Hypo-osmolality and hyponatremia: Secondary | ICD-10-CM | POA: Diagnosis not present

## 2018-01-26 DIAGNOSIS — M9903 Segmental and somatic dysfunction of lumbar region: Secondary | ICD-10-CM | POA: Diagnosis not present

## 2018-01-26 DIAGNOSIS — S233XXA Sprain of ligaments of thoracic spine, initial encounter: Secondary | ICD-10-CM | POA: Diagnosis not present

## 2018-01-26 DIAGNOSIS — M9901 Segmental and somatic dysfunction of cervical region: Secondary | ICD-10-CM | POA: Diagnosis not present

## 2018-01-26 DIAGNOSIS — S134XXA Sprain of ligaments of cervical spine, initial encounter: Secondary | ICD-10-CM | POA: Diagnosis not present

## 2018-01-26 DIAGNOSIS — M9902 Segmental and somatic dysfunction of thoracic region: Secondary | ICD-10-CM | POA: Diagnosis not present

## 2018-01-26 DIAGNOSIS — S338XXA Sprain of other parts of lumbar spine and pelvis, initial encounter: Secondary | ICD-10-CM | POA: Diagnosis not present

## 2018-01-29 DIAGNOSIS — R300 Dysuria: Secondary | ICD-10-CM | POA: Diagnosis not present

## 2018-01-29 DIAGNOSIS — Z6826 Body mass index (BMI) 26.0-26.9, adult: Secondary | ICD-10-CM | POA: Diagnosis not present

## 2018-01-29 DIAGNOSIS — K5901 Slow transit constipation: Secondary | ICD-10-CM | POA: Diagnosis not present

## 2018-01-29 DIAGNOSIS — H9201 Otalgia, right ear: Secondary | ICD-10-CM | POA: Diagnosis not present

## 2018-02-18 ENCOUNTER — Ambulatory Visit (INDEPENDENT_AMBULATORY_CARE_PROVIDER_SITE_OTHER): Payer: PPO | Admitting: Urology

## 2018-02-18 DIAGNOSIS — N401 Enlarged prostate with lower urinary tract symptoms: Secondary | ICD-10-CM | POA: Diagnosis not present

## 2018-02-18 DIAGNOSIS — N3281 Overactive bladder: Secondary | ICD-10-CM

## 2018-03-20 DIAGNOSIS — N3941 Urge incontinence: Secondary | ICD-10-CM | POA: Diagnosis not present

## 2018-03-26 DIAGNOSIS — N3941 Urge incontinence: Secondary | ICD-10-CM | POA: Diagnosis not present

## 2018-04-03 DIAGNOSIS — N3941 Urge incontinence: Secondary | ICD-10-CM | POA: Diagnosis not present

## 2018-04-09 DIAGNOSIS — N3941 Urge incontinence: Secondary | ICD-10-CM | POA: Diagnosis not present

## 2018-04-17 DIAGNOSIS — N3941 Urge incontinence: Secondary | ICD-10-CM | POA: Diagnosis not present

## 2018-04-23 DIAGNOSIS — N3941 Urge incontinence: Secondary | ICD-10-CM | POA: Diagnosis not present

## 2018-05-01 DIAGNOSIS — N3941 Urge incontinence: Secondary | ICD-10-CM | POA: Diagnosis not present

## 2018-05-08 DIAGNOSIS — N3941 Urge incontinence: Secondary | ICD-10-CM | POA: Diagnosis not present

## 2018-05-22 DIAGNOSIS — N3941 Urge incontinence: Secondary | ICD-10-CM | POA: Diagnosis not present

## 2018-05-27 DIAGNOSIS — N401 Enlarged prostate with lower urinary tract symptoms: Secondary | ICD-10-CM | POA: Diagnosis not present

## 2018-05-27 DIAGNOSIS — G301 Alzheimer's disease with late onset: Secondary | ICD-10-CM | POA: Diagnosis not present

## 2018-05-27 DIAGNOSIS — R300 Dysuria: Secondary | ICD-10-CM | POA: Diagnosis not present

## 2018-05-27 DIAGNOSIS — D500 Iron deficiency anemia secondary to blood loss (chronic): Secondary | ICD-10-CM | POA: Diagnosis not present

## 2018-05-27 DIAGNOSIS — I10 Essential (primary) hypertension: Secondary | ICD-10-CM | POA: Diagnosis not present

## 2018-05-27 DIAGNOSIS — Z1212 Encounter for screening for malignant neoplasm of rectum: Secondary | ICD-10-CM | POA: Diagnosis not present

## 2018-05-27 DIAGNOSIS — F331 Major depressive disorder, recurrent, moderate: Secondary | ICD-10-CM | POA: Diagnosis not present

## 2018-05-27 DIAGNOSIS — N3000 Acute cystitis without hematuria: Secondary | ICD-10-CM | POA: Diagnosis not present

## 2018-05-27 DIAGNOSIS — K219 Gastro-esophageal reflux disease without esophagitis: Secondary | ICD-10-CM | POA: Diagnosis not present

## 2018-05-27 DIAGNOSIS — E039 Hypothyroidism, unspecified: Secondary | ICD-10-CM | POA: Diagnosis not present

## 2018-05-27 DIAGNOSIS — Z6826 Body mass index (BMI) 26.0-26.9, adult: Secondary | ICD-10-CM | POA: Diagnosis not present

## 2018-05-27 DIAGNOSIS — E871 Hypo-osmolality and hyponatremia: Secondary | ICD-10-CM | POA: Diagnosis not present

## 2018-05-29 DIAGNOSIS — N3941 Urge incontinence: Secondary | ICD-10-CM | POA: Diagnosis not present

## 2018-05-30 DIAGNOSIS — S338XXA Sprain of other parts of lumbar spine and pelvis, initial encounter: Secondary | ICD-10-CM | POA: Diagnosis not present

## 2018-05-30 DIAGNOSIS — M9901 Segmental and somatic dysfunction of cervical region: Secondary | ICD-10-CM | POA: Diagnosis not present

## 2018-05-30 DIAGNOSIS — M9902 Segmental and somatic dysfunction of thoracic region: Secondary | ICD-10-CM | POA: Diagnosis not present

## 2018-05-30 DIAGNOSIS — S134XXA Sprain of ligaments of cervical spine, initial encounter: Secondary | ICD-10-CM | POA: Diagnosis not present

## 2018-05-30 DIAGNOSIS — M9903 Segmental and somatic dysfunction of lumbar region: Secondary | ICD-10-CM | POA: Diagnosis not present

## 2018-05-30 DIAGNOSIS — S233XXA Sprain of ligaments of thoracic spine, initial encounter: Secondary | ICD-10-CM | POA: Diagnosis not present

## 2018-06-01 DIAGNOSIS — S233XXA Sprain of ligaments of thoracic spine, initial encounter: Secondary | ICD-10-CM | POA: Diagnosis not present

## 2018-06-01 DIAGNOSIS — S338XXA Sprain of other parts of lumbar spine and pelvis, initial encounter: Secondary | ICD-10-CM | POA: Diagnosis not present

## 2018-06-01 DIAGNOSIS — M9903 Segmental and somatic dysfunction of lumbar region: Secondary | ICD-10-CM | POA: Diagnosis not present

## 2018-06-01 DIAGNOSIS — M9902 Segmental and somatic dysfunction of thoracic region: Secondary | ICD-10-CM | POA: Diagnosis not present

## 2018-06-01 DIAGNOSIS — S134XXA Sprain of ligaments of cervical spine, initial encounter: Secondary | ICD-10-CM | POA: Diagnosis not present

## 2018-06-01 DIAGNOSIS — M9901 Segmental and somatic dysfunction of cervical region: Secondary | ICD-10-CM | POA: Diagnosis not present

## 2018-06-03 DIAGNOSIS — S338XXA Sprain of other parts of lumbar spine and pelvis, initial encounter: Secondary | ICD-10-CM | POA: Diagnosis not present

## 2018-06-03 DIAGNOSIS — M9901 Segmental and somatic dysfunction of cervical region: Secondary | ICD-10-CM | POA: Diagnosis not present

## 2018-06-03 DIAGNOSIS — S134XXA Sprain of ligaments of cervical spine, initial encounter: Secondary | ICD-10-CM | POA: Diagnosis not present

## 2018-06-03 DIAGNOSIS — M9902 Segmental and somatic dysfunction of thoracic region: Secondary | ICD-10-CM | POA: Diagnosis not present

## 2018-06-03 DIAGNOSIS — M9903 Segmental and somatic dysfunction of lumbar region: Secondary | ICD-10-CM | POA: Diagnosis not present

## 2018-06-03 DIAGNOSIS — S233XXA Sprain of ligaments of thoracic spine, initial encounter: Secondary | ICD-10-CM | POA: Diagnosis not present

## 2018-06-05 DIAGNOSIS — N3941 Urge incontinence: Secondary | ICD-10-CM | POA: Diagnosis not present

## 2018-06-10 ENCOUNTER — Ambulatory Visit: Payer: PPO | Admitting: Urology

## 2018-06-10 DIAGNOSIS — N3281 Overactive bladder: Secondary | ICD-10-CM

## 2018-06-10 DIAGNOSIS — N401 Enlarged prostate with lower urinary tract symptoms: Secondary | ICD-10-CM | POA: Diagnosis not present

## 2018-06-12 DIAGNOSIS — N3941 Urge incontinence: Secondary | ICD-10-CM | POA: Diagnosis not present

## 2018-06-18 DIAGNOSIS — L570 Actinic keratosis: Secondary | ICD-10-CM | POA: Diagnosis not present

## 2018-06-18 DIAGNOSIS — Z6827 Body mass index (BMI) 27.0-27.9, adult: Secondary | ICD-10-CM | POA: Diagnosis not present

## 2018-06-18 DIAGNOSIS — R300 Dysuria: Secondary | ICD-10-CM | POA: Diagnosis not present

## 2018-06-18 DIAGNOSIS — Z23 Encounter for immunization: Secondary | ICD-10-CM | POA: Diagnosis not present

## 2018-06-27 DIAGNOSIS — J069 Acute upper respiratory infection, unspecified: Secondary | ICD-10-CM | POA: Diagnosis not present

## 2018-06-27 DIAGNOSIS — R05 Cough: Secondary | ICD-10-CM | POA: Diagnosis not present

## 2018-06-27 DIAGNOSIS — Z6827 Body mass index (BMI) 27.0-27.9, adult: Secondary | ICD-10-CM | POA: Diagnosis not present

## 2018-07-03 DIAGNOSIS — N3941 Urge incontinence: Secondary | ICD-10-CM | POA: Diagnosis not present

## 2018-07-08 DIAGNOSIS — E039 Hypothyroidism, unspecified: Secondary | ICD-10-CM | POA: Diagnosis not present

## 2018-07-16 DIAGNOSIS — E039 Hypothyroidism, unspecified: Secondary | ICD-10-CM | POA: Diagnosis not present

## 2018-07-16 DIAGNOSIS — E871 Hypo-osmolality and hyponatremia: Secondary | ICD-10-CM | POA: Diagnosis not present

## 2018-07-16 DIAGNOSIS — D500 Iron deficiency anemia secondary to blood loss (chronic): Secondary | ICD-10-CM | POA: Diagnosis not present

## 2018-07-16 DIAGNOSIS — N3000 Acute cystitis without hematuria: Secondary | ICD-10-CM | POA: Diagnosis not present

## 2018-07-16 DIAGNOSIS — Z6826 Body mass index (BMI) 26.0-26.9, adult: Secondary | ICD-10-CM | POA: Diagnosis not present

## 2018-07-16 DIAGNOSIS — G301 Alzheimer's disease with late onset: Secondary | ICD-10-CM | POA: Diagnosis not present

## 2018-07-16 DIAGNOSIS — Z0001 Encounter for general adult medical examination with abnormal findings: Secondary | ICD-10-CM | POA: Diagnosis not present

## 2018-07-16 DIAGNOSIS — I10 Essential (primary) hypertension: Secondary | ICD-10-CM | POA: Diagnosis not present

## 2018-07-22 ENCOUNTER — Ambulatory Visit: Payer: PPO | Admitting: Urology

## 2018-07-22 DIAGNOSIS — N401 Enlarged prostate with lower urinary tract symptoms: Secondary | ICD-10-CM | POA: Diagnosis not present

## 2018-07-22 DIAGNOSIS — N3281 Overactive bladder: Secondary | ICD-10-CM

## 2018-07-24 DIAGNOSIS — N3941 Urge incontinence: Secondary | ICD-10-CM | POA: Diagnosis not present

## 2018-07-31 DIAGNOSIS — Z6827 Body mass index (BMI) 27.0-27.9, adult: Secondary | ICD-10-CM | POA: Diagnosis not present

## 2018-07-31 DIAGNOSIS — S2239XA Fracture of one rib, unspecified side, initial encounter for closed fracture: Secondary | ICD-10-CM | POA: Diagnosis not present

## 2018-08-13 DIAGNOSIS — N3941 Urge incontinence: Secondary | ICD-10-CM | POA: Diagnosis not present

## 2018-08-18 DIAGNOSIS — J45909 Unspecified asthma, uncomplicated: Secondary | ICD-10-CM | POA: Diagnosis not present

## 2018-08-18 DIAGNOSIS — E039 Hypothyroidism, unspecified: Secondary | ICD-10-CM | POA: Diagnosis not present

## 2018-08-18 DIAGNOSIS — Z6827 Body mass index (BMI) 27.0-27.9, adult: Secondary | ICD-10-CM | POA: Diagnosis not present

## 2018-09-04 DIAGNOSIS — N3941 Urge incontinence: Secondary | ICD-10-CM | POA: Diagnosis not present

## 2018-09-25 DIAGNOSIS — E039 Hypothyroidism, unspecified: Secondary | ICD-10-CM | POA: Diagnosis not present

## 2018-09-25 DIAGNOSIS — N3941 Urge incontinence: Secondary | ICD-10-CM | POA: Diagnosis not present

## 2018-10-13 DIAGNOSIS — S134XXA Sprain of ligaments of cervical spine, initial encounter: Secondary | ICD-10-CM | POA: Diagnosis not present

## 2018-10-13 DIAGNOSIS — S338XXA Sprain of other parts of lumbar spine and pelvis, initial encounter: Secondary | ICD-10-CM | POA: Diagnosis not present

## 2018-10-13 DIAGNOSIS — M9902 Segmental and somatic dysfunction of thoracic region: Secondary | ICD-10-CM | POA: Diagnosis not present

## 2018-10-13 DIAGNOSIS — S233XXA Sprain of ligaments of thoracic spine, initial encounter: Secondary | ICD-10-CM | POA: Diagnosis not present

## 2018-10-13 DIAGNOSIS — M9903 Segmental and somatic dysfunction of lumbar region: Secondary | ICD-10-CM | POA: Diagnosis not present

## 2018-10-13 DIAGNOSIS — M9901 Segmental and somatic dysfunction of cervical region: Secondary | ICD-10-CM | POA: Diagnosis not present

## 2018-10-16 DIAGNOSIS — N3941 Urge incontinence: Secondary | ICD-10-CM | POA: Diagnosis not present

## 2018-11-05 DIAGNOSIS — N3941 Urge incontinence: Secondary | ICD-10-CM | POA: Diagnosis not present

## 2018-11-20 DIAGNOSIS — E871 Hypo-osmolality and hyponatremia: Secondary | ICD-10-CM | POA: Diagnosis not present

## 2018-11-20 DIAGNOSIS — I10 Essential (primary) hypertension: Secondary | ICD-10-CM | POA: Diagnosis not present

## 2018-11-20 DIAGNOSIS — E039 Hypothyroidism, unspecified: Secondary | ICD-10-CM | POA: Diagnosis not present

## 2018-11-20 DIAGNOSIS — G301 Alzheimer's disease with late onset: Secondary | ICD-10-CM | POA: Diagnosis not present

## 2018-11-20 DIAGNOSIS — K210 Gastro-esophageal reflux disease with esophagitis: Secondary | ICD-10-CM | POA: Diagnosis not present

## 2018-11-27 ENCOUNTER — Ambulatory Visit: Payer: PPO | Admitting: Urology

## 2018-11-27 DIAGNOSIS — I10 Essential (primary) hypertension: Secondary | ICD-10-CM | POA: Diagnosis not present

## 2018-11-27 DIAGNOSIS — Z0001 Encounter for general adult medical examination with abnormal findings: Secondary | ICD-10-CM | POA: Diagnosis not present

## 2018-11-27 DIAGNOSIS — N3281 Overactive bladder: Secondary | ICD-10-CM

## 2018-11-27 DIAGNOSIS — Z6826 Body mass index (BMI) 26.0-26.9, adult: Secondary | ICD-10-CM | POA: Diagnosis not present

## 2018-11-27 DIAGNOSIS — N401 Enlarged prostate with lower urinary tract symptoms: Secondary | ICD-10-CM

## 2018-11-29 DIAGNOSIS — S0990XA Unspecified injury of head, initial encounter: Secondary | ICD-10-CM | POA: Diagnosis not present

## 2018-11-29 DIAGNOSIS — D649 Anemia, unspecified: Secondary | ICD-10-CM | POA: Diagnosis not present

## 2018-11-29 DIAGNOSIS — A4152 Sepsis due to Pseudomonas: Secondary | ICD-10-CM | POA: Diagnosis not present

## 2018-11-29 DIAGNOSIS — J189 Pneumonia, unspecified organism: Secondary | ICD-10-CM | POA: Diagnosis not present

## 2018-11-29 DIAGNOSIS — R918 Other nonspecific abnormal finding of lung field: Secondary | ICD-10-CM | POA: Diagnosis not present

## 2018-11-29 DIAGNOSIS — M2550 Pain in unspecified joint: Secondary | ICD-10-CM | POA: Diagnosis not present

## 2018-11-29 DIAGNOSIS — R51 Headache: Secondary | ICD-10-CM | POA: Diagnosis not present

## 2018-11-29 DIAGNOSIS — E871 Hypo-osmolality and hyponatremia: Secondary | ICD-10-CM | POA: Diagnosis not present

## 2018-11-29 DIAGNOSIS — K5900 Constipation, unspecified: Secondary | ICD-10-CM | POA: Diagnosis not present

## 2018-11-29 DIAGNOSIS — R4781 Slurred speech: Secondary | ICD-10-CM | POA: Diagnosis not present

## 2018-11-29 DIAGNOSIS — R2689 Other abnormalities of gait and mobility: Secondary | ICD-10-CM | POA: Diagnosis not present

## 2018-11-29 DIAGNOSIS — Z87891 Personal history of nicotine dependence: Secondary | ICD-10-CM | POA: Diagnosis not present

## 2018-11-29 DIAGNOSIS — Z9181 History of falling: Secondary | ICD-10-CM | POA: Diagnosis not present

## 2018-11-29 DIAGNOSIS — N3281 Overactive bladder: Secondary | ICD-10-CM | POA: Diagnosis not present

## 2018-11-29 DIAGNOSIS — R319 Hematuria, unspecified: Secondary | ICD-10-CM | POA: Diagnosis not present

## 2018-11-29 DIAGNOSIS — N400 Enlarged prostate without lower urinary tract symptoms: Secondary | ICD-10-CM | POA: Diagnosis not present

## 2018-11-29 DIAGNOSIS — R41841 Cognitive communication deficit: Secondary | ICD-10-CM | POA: Diagnosis not present

## 2018-11-29 DIAGNOSIS — B965 Pseudomonas (aeruginosa) (mallei) (pseudomallei) as the cause of diseases classified elsewhere: Secondary | ICD-10-CM | POA: Diagnosis not present

## 2018-11-29 DIAGNOSIS — Z87440 Personal history of urinary (tract) infections: Secondary | ICD-10-CM | POA: Diagnosis not present

## 2018-11-29 DIAGNOSIS — H9190 Unspecified hearing loss, unspecified ear: Secondary | ICD-10-CM | POA: Diagnosis not present

## 2018-11-29 DIAGNOSIS — E039 Hypothyroidism, unspecified: Secondary | ICD-10-CM | POA: Diagnosis not present

## 2018-11-29 DIAGNOSIS — G609 Hereditary and idiopathic neuropathy, unspecified: Secondary | ICD-10-CM | POA: Diagnosis not present

## 2018-11-29 DIAGNOSIS — M1990 Unspecified osteoarthritis, unspecified site: Secondary | ICD-10-CM | POA: Diagnosis not present

## 2018-11-29 DIAGNOSIS — R0902 Hypoxemia: Secondary | ICD-10-CM | POA: Diagnosis not present

## 2018-11-29 DIAGNOSIS — Z7401 Bed confinement status: Secondary | ICD-10-CM | POA: Diagnosis not present

## 2018-11-29 DIAGNOSIS — F0280 Dementia in other diseases classified elsewhere without behavioral disturbance: Secondary | ICD-10-CM | POA: Diagnosis not present

## 2018-11-29 DIAGNOSIS — I2510 Atherosclerotic heart disease of native coronary artery without angina pectoris: Secondary | ICD-10-CM | POA: Diagnosis not present

## 2018-11-29 DIAGNOSIS — A419 Sepsis, unspecified organism: Secondary | ICD-10-CM | POA: Diagnosis not present

## 2018-11-29 DIAGNOSIS — R509 Fever, unspecified: Secondary | ICD-10-CM | POA: Diagnosis not present

## 2018-11-29 DIAGNOSIS — T8144XD Sepsis following a procedure, subsequent encounter: Secondary | ICD-10-CM | POA: Diagnosis not present

## 2018-11-29 DIAGNOSIS — N390 Urinary tract infection, site not specified: Secondary | ICD-10-CM | POA: Diagnosis not present

## 2018-11-29 DIAGNOSIS — F3340 Major depressive disorder, recurrent, in remission, unspecified: Secondary | ICD-10-CM | POA: Diagnosis not present

## 2018-11-29 DIAGNOSIS — M6281 Muscle weakness (generalized): Secondary | ICD-10-CM | POA: Diagnosis not present

## 2018-11-29 DIAGNOSIS — K219 Gastro-esophageal reflux disease without esophagitis: Secondary | ICD-10-CM | POA: Diagnosis not present

## 2018-11-29 DIAGNOSIS — Z79899 Other long term (current) drug therapy: Secondary | ICD-10-CM | POA: Diagnosis not present

## 2018-11-29 DIAGNOSIS — G309 Alzheimer's disease, unspecified: Secondary | ICD-10-CM | POA: Diagnosis not present

## 2018-11-29 DIAGNOSIS — I10 Essential (primary) hypertension: Secondary | ICD-10-CM | POA: Diagnosis not present

## 2018-11-29 DIAGNOSIS — R58 Hemorrhage, not elsewhere classified: Secondary | ICD-10-CM | POA: Diagnosis not present

## 2018-11-29 DIAGNOSIS — R0689 Other abnormalities of breathing: Secondary | ICD-10-CM | POA: Diagnosis not present

## 2018-12-07 DIAGNOSIS — R2689 Other abnormalities of gait and mobility: Secondary | ICD-10-CM | POA: Diagnosis not present

## 2018-12-07 DIAGNOSIS — J189 Pneumonia, unspecified organism: Secondary | ICD-10-CM | POA: Diagnosis not present

## 2018-12-07 DIAGNOSIS — R41841 Cognitive communication deficit: Secondary | ICD-10-CM | POA: Diagnosis not present

## 2018-12-07 DIAGNOSIS — I2510 Atherosclerotic heart disease of native coronary artery without angina pectoris: Secondary | ICD-10-CM | POA: Diagnosis not present

## 2018-12-07 DIAGNOSIS — E871 Hypo-osmolality and hyponatremia: Secondary | ICD-10-CM | POA: Diagnosis not present

## 2018-12-07 DIAGNOSIS — N390 Urinary tract infection, site not specified: Secondary | ICD-10-CM | POA: Diagnosis not present

## 2018-12-07 DIAGNOSIS — M6281 Muscle weakness (generalized): Secondary | ICD-10-CM | POA: Diagnosis not present

## 2018-12-07 DIAGNOSIS — M2550 Pain in unspecified joint: Secondary | ICD-10-CM | POA: Diagnosis not present

## 2018-12-07 DIAGNOSIS — T8144XD Sepsis following a procedure, subsequent encounter: Secondary | ICD-10-CM | POA: Diagnosis not present

## 2018-12-07 DIAGNOSIS — R51 Headache: Secondary | ICD-10-CM | POA: Diagnosis not present

## 2018-12-07 DIAGNOSIS — A4152 Sepsis due to Pseudomonas: Secondary | ICD-10-CM | POA: Diagnosis not present

## 2018-12-07 DIAGNOSIS — Z7401 Bed confinement status: Secondary | ICD-10-CM | POA: Diagnosis not present

## 2018-12-07 DIAGNOSIS — R58 Hemorrhage, not elsewhere classified: Secondary | ICD-10-CM | POA: Diagnosis not present

## 2018-12-07 DIAGNOSIS — B965 Pseudomonas (aeruginosa) (mallei) (pseudomallei) as the cause of diseases classified elsewhere: Secondary | ICD-10-CM | POA: Diagnosis not present

## 2018-12-07 DIAGNOSIS — S0990XA Unspecified injury of head, initial encounter: Secondary | ICD-10-CM | POA: Diagnosis not present

## 2018-12-07 DIAGNOSIS — M542 Cervicalgia: Secondary | ICD-10-CM | POA: Diagnosis not present

## 2018-12-07 DIAGNOSIS — M25511 Pain in right shoulder: Secondary | ICD-10-CM | POA: Diagnosis not present

## 2018-12-10 ENCOUNTER — Other Ambulatory Visit: Payer: Self-pay | Admitting: *Deleted

## 2018-12-10 NOTE — Patient Outreach (Signed)
[facility] ([facility]) Care Management  12/10/2018  Erdem K Masella Sr. 09/01/1935 [id number]   Onsite visit to facility.  IDT meeting. Patient on IV antibiotics and IV fluids, MD ordered urine C & S.  Patient will go home with wife.   Met with patient. He was moved to a new room, he states he had a rough few first days at facility and had some complaints.  Patient reports if he could of walked he would of left facility. He states new room and section of facility is much better.  He has followed up with patient advocate at facility about complaints.  Patient reports his wife is Evelyn and her number is [phone] RNCM discussed [facility] care management and transition of care calls.  Left a packet with RNCM contact.  Plan to follow up with wife and will monitor patient for any additional [facility] care management needs.  Mary E. Niemczura, MSN, RN, AGNP-C, CCM  Post Acute Care Coordinator [facility] ([phone]) Business Cell  [phone]) Toll Free Office 

## 2018-12-12 DIAGNOSIS — A4152 Sepsis due to Pseudomonas: Secondary | ICD-10-CM | POA: Diagnosis not present

## 2018-12-16 ENCOUNTER — Other Ambulatory Visit: Payer: Self-pay | Admitting: *Deleted

## 2018-12-16 NOTE — Patient Outreach (Signed)
Outreach call to patient wife, [phone] Left HIPAA compliant voicemail with RNCM contact. Plan to follow up with patient at facility. Royetta Crochet. Laymond Purser, MSN, RN, [employer] [place] [phone]) Business Cell  [phone]) Toll Free Office

## 2018-12-24 DIAGNOSIS — M542 Cervicalgia: Secondary | ICD-10-CM | POA: Diagnosis not present

## 2018-12-24 DIAGNOSIS — S0990XA Unspecified injury of head, initial encounter: Secondary | ICD-10-CM | POA: Diagnosis not present

## 2018-12-24 DIAGNOSIS — R51 Headache: Secondary | ICD-10-CM | POA: Diagnosis not present

## 2018-12-26 DIAGNOSIS — N3281 Overactive bladder: Secondary | ICD-10-CM | POA: Diagnosis not present

## 2018-12-26 DIAGNOSIS — M6281 Muscle weakness (generalized): Secondary | ICD-10-CM | POA: Diagnosis not present

## 2018-12-26 DIAGNOSIS — Z8673 Personal history of transient ischemic attack (TIA), and cerebral infarction without residual deficits: Secondary | ICD-10-CM | POA: Diagnosis not present

## 2018-12-26 DIAGNOSIS — F339 Major depressive disorder, recurrent, unspecified: Secondary | ICD-10-CM | POA: Diagnosis not present

## 2018-12-26 DIAGNOSIS — K219 Gastro-esophageal reflux disease without esophagitis: Secondary | ICD-10-CM | POA: Diagnosis not present

## 2018-12-26 DIAGNOSIS — N390 Urinary tract infection, site not specified: Secondary | ICD-10-CM | POA: Diagnosis not present

## 2018-12-26 DIAGNOSIS — A4152 Sepsis due to Pseudomonas: Secondary | ICD-10-CM | POA: Diagnosis not present

## 2018-12-26 DIAGNOSIS — M1990 Unspecified osteoarthritis, unspecified site: Secondary | ICD-10-CM | POA: Diagnosis not present

## 2018-12-26 DIAGNOSIS — G309 Alzheimer's disease, unspecified: Secondary | ICD-10-CM | POA: Diagnosis not present

## 2018-12-26 DIAGNOSIS — Z9181 History of falling: Secondary | ICD-10-CM | POA: Diagnosis not present

## 2018-12-26 DIAGNOSIS — F0280 Dementia in other diseases classified elsewhere without behavioral disturbance: Secondary | ICD-10-CM | POA: Diagnosis not present

## 2018-12-26 DIAGNOSIS — Z7982 Long term (current) use of aspirin: Secondary | ICD-10-CM | POA: Diagnosis not present

## 2018-12-26 DIAGNOSIS — I10 Essential (primary) hypertension: Secondary | ICD-10-CM | POA: Diagnosis not present

## 2018-12-26 DIAGNOSIS — Z96642 Presence of left artificial hip joint: Secondary | ICD-10-CM | POA: Diagnosis not present

## 2018-12-26 DIAGNOSIS — R296 Repeated falls: Secondary | ICD-10-CM | POA: Diagnosis not present

## 2018-12-26 DIAGNOSIS — D649 Anemia, unspecified: Secondary | ICD-10-CM | POA: Diagnosis not present

## 2018-12-26 DIAGNOSIS — E039 Hypothyroidism, unspecified: Secondary | ICD-10-CM | POA: Diagnosis not present

## 2018-12-26 DIAGNOSIS — N400 Enlarged prostate without lower urinary tract symptoms: Secondary | ICD-10-CM | POA: Diagnosis not present

## 2018-12-26 DIAGNOSIS — R2689 Other abnormalities of gait and mobility: Secondary | ICD-10-CM | POA: Diagnosis not present

## 2018-12-26 DIAGNOSIS — T8144XD Sepsis following a procedure, subsequent encounter: Secondary | ICD-10-CM | POA: Diagnosis not present

## 2018-12-26 DIAGNOSIS — M4692 Unspecified inflammatory spondylopathy, cervical region: Secondary | ICD-10-CM | POA: Diagnosis not present

## 2018-12-26 DIAGNOSIS — I2510 Atherosclerotic heart disease of native coronary artery without angina pectoris: Secondary | ICD-10-CM | POA: Diagnosis not present

## 2018-12-26 DIAGNOSIS — H9190 Unspecified hearing loss, unspecified ear: Secondary | ICD-10-CM | POA: Diagnosis not present

## 2018-12-26 DIAGNOSIS — G609 Hereditary and idiopathic neuropathy, unspecified: Secondary | ICD-10-CM | POA: Diagnosis not present

## 2018-12-28 DIAGNOSIS — R300 Dysuria: Secondary | ICD-10-CM | POA: Diagnosis not present

## 2018-12-28 DIAGNOSIS — Z6826 Body mass index (BMI) 26.0-26.9, adult: Secondary | ICD-10-CM | POA: Diagnosis not present

## 2018-12-28 DIAGNOSIS — A498 Other bacterial infections of unspecified site: Secondary | ICD-10-CM | POA: Diagnosis not present

## 2018-12-29 ENCOUNTER — Other Ambulatory Visit: Payer: Self-pay

## 2018-12-29 NOTE — Patient Outreach (Signed)
[place] [facility]) Care Management  12/29/2018  Christian Miller Sr. [birth year] [id number]   Referral Date: 12/29/2018 Referral Source: [facility] report Date of Admission: unknown Diagnosis: Other idiopathic peripheral autonomic neuropathy  Date of Discharge:12/24/2018 Facility:  [place]: [facility]  Outreach attempt: All numbers attempted.  Unable to reach patient.     Plan: RN CM will attempt patient within 4 business days and send letter.    Jone Baseman, RN, MSN [facility] Care Management Care Management Coordinator Direct Line [phone] Toll Free: [phone]  Fax: [phone]

## 2018-12-30 ENCOUNTER — Other Ambulatory Visit: Payer: Self-pay

## 2018-12-30 NOTE — Patient Outreach (Signed)
[place] [facility]) Care Management  12/30/2018  Christian RUDY Sr. 06-18-1935 [id number]   Referral Date: 12/29/2018 Referral Source: [facility] report Date of Admission: unknown Diagnosis: Other idiopathic peripheral autonomic neuropathy  Date of Discharge:12/24/2018 Facility:  [place]: [facility]  Outreach attempt: All numbers attempted.  Unable to reach patient.     Plan: RN CM will attempt patient within 4 business days.  Jone Baseman, RN, MSN [place] Management Care Management Coordinator Direct Line [phone] Cell [phone] Toll Free: [phone]  Fax: [phone]

## 2018-12-31 ENCOUNTER — Other Ambulatory Visit: Payer: Self-pay

## 2018-12-31 NOTE — Patient Outreach (Signed)
[place] [facility]) Care Management  12/31/2018  Christian COHICK Sr. 08/09/1935 [id number]   Referral Date: 12/29/2018 Referral Source: [facility] report Date of Admission: unknown Diagnosis: Other idiopathic peripheral autonomic neuropathy  Date of Discharge:12/24/2018 Facility:  [place]: [facility]  Outreach attempt: All numbers attempted.  Unable to reach patient.     Plan: RN CM will wait return call.  If no return call will close case.    Jone Baseman, RN, MSN [place] Management Care Management Coordinator Direct Line [phone] Cell [phone] Toll Free: [phone]  Fax: [phone]

## 2019-01-08 DIAGNOSIS — R296 Repeated falls: Secondary | ICD-10-CM | POA: Diagnosis not present

## 2019-01-08 DIAGNOSIS — M6281 Muscle weakness (generalized): Secondary | ICD-10-CM | POA: Diagnosis not present

## 2019-01-08 DIAGNOSIS — N390 Urinary tract infection, site not specified: Secondary | ICD-10-CM | POA: Diagnosis not present

## 2019-01-08 DIAGNOSIS — T8144XD Sepsis following a procedure, subsequent encounter: Secondary | ICD-10-CM | POA: Diagnosis not present

## 2019-01-08 DIAGNOSIS — D649 Anemia, unspecified: Secondary | ICD-10-CM | POA: Diagnosis not present

## 2019-01-08 DIAGNOSIS — A4152 Sepsis due to Pseudomonas: Secondary | ICD-10-CM | POA: Diagnosis not present

## 2019-01-08 DIAGNOSIS — G309 Alzheimer's disease, unspecified: Secondary | ICD-10-CM | POA: Diagnosis not present

## 2019-01-08 DIAGNOSIS — R2689 Other abnormalities of gait and mobility: Secondary | ICD-10-CM | POA: Diagnosis not present

## 2019-01-12 ENCOUNTER — Other Ambulatory Visit: Payer: Self-pay

## 2019-01-12 NOTE — Patient Outreach (Signed)
[place] [facility]) Care Management  01/12/2019  Christian WENINGER Sr. May 22, 1935 [id number]   Multiple attempts to establish contact with patient without success. No response from letter mailed to patient.   Plan: RN CM will close case at this time.   Jone Baseman, RN, MSN [place] [facility] Care Management Coordinator Direct Line [phone] Cell [phone] Toll Free: [phone]  Fax: [phone]

## 2019-01-23 DIAGNOSIS — A498 Other bacterial infections of unspecified site: Secondary | ICD-10-CM | POA: Diagnosis not present

## 2019-01-23 DIAGNOSIS — R300 Dysuria: Secondary | ICD-10-CM | POA: Diagnosis not present

## 2019-02-11 DIAGNOSIS — E039 Hypothyroidism, unspecified: Secondary | ICD-10-CM | POA: Diagnosis not present

## 2019-02-11 DIAGNOSIS — I10 Essential (primary) hypertension: Secondary | ICD-10-CM | POA: Diagnosis not present

## 2019-03-11 DIAGNOSIS — Z1159 Encounter for screening for other viral diseases: Secondary | ICD-10-CM | POA: Diagnosis not present

## 2019-03-11 DIAGNOSIS — Z01818 Encounter for other preprocedural examination: Secondary | ICD-10-CM | POA: Diagnosis not present

## 2019-03-13 DIAGNOSIS — E039 Hypothyroidism, unspecified: Secondary | ICD-10-CM | POA: Diagnosis not present

## 2019-03-13 DIAGNOSIS — I10 Essential (primary) hypertension: Secondary | ICD-10-CM | POA: Diagnosis not present

## 2019-03-13 DIAGNOSIS — E782 Mixed hyperlipidemia: Secondary | ICD-10-CM | POA: Diagnosis not present

## 2019-03-19 DIAGNOSIS — E039 Hypothyroidism, unspecified: Secondary | ICD-10-CM | POA: Diagnosis not present

## 2019-03-19 DIAGNOSIS — D649 Anemia, unspecified: Secondary | ICD-10-CM | POA: Diagnosis not present

## 2019-03-19 DIAGNOSIS — E876 Hypokalemia: Secondary | ICD-10-CM | POA: Diagnosis not present

## 2019-03-19 DIAGNOSIS — E782 Mixed hyperlipidemia: Secondary | ICD-10-CM | POA: Diagnosis not present

## 2019-03-26 DIAGNOSIS — E039 Hypothyroidism, unspecified: Secondary | ICD-10-CM | POA: Diagnosis not present

## 2019-03-26 DIAGNOSIS — Z1212 Encounter for screening for malignant neoplasm of rectum: Secondary | ICD-10-CM | POA: Diagnosis not present

## 2019-03-26 DIAGNOSIS — G301 Alzheimer's disease with late onset: Secondary | ICD-10-CM | POA: Diagnosis not present

## 2019-03-26 DIAGNOSIS — Z6825 Body mass index (BMI) 25.0-25.9, adult: Secondary | ICD-10-CM | POA: Diagnosis not present

## 2019-03-26 DIAGNOSIS — F331 Major depressive disorder, recurrent, moderate: Secondary | ICD-10-CM | POA: Diagnosis not present

## 2019-03-26 DIAGNOSIS — K219 Gastro-esophageal reflux disease without esophagitis: Secondary | ICD-10-CM | POA: Diagnosis not present

## 2019-03-26 DIAGNOSIS — E875 Hyperkalemia: Secondary | ICD-10-CM | POA: Diagnosis not present

## 2019-03-26 DIAGNOSIS — D500 Iron deficiency anemia secondary to blood loss (chronic): Secondary | ICD-10-CM | POA: Diagnosis not present

## 2019-03-26 DIAGNOSIS — N401 Enlarged prostate with lower urinary tract symptoms: Secondary | ICD-10-CM | POA: Diagnosis not present

## 2019-03-26 DIAGNOSIS — E871 Hypo-osmolality and hyponatremia: Secondary | ICD-10-CM | POA: Diagnosis not present

## 2019-03-26 DIAGNOSIS — I10 Essential (primary) hypertension: Secondary | ICD-10-CM | POA: Diagnosis not present

## 2019-04-09 DIAGNOSIS — E876 Hypokalemia: Secondary | ICD-10-CM | POA: Diagnosis not present

## 2019-04-12 DIAGNOSIS — Z6824 Body mass index (BMI) 24.0-24.9, adult: Secondary | ICD-10-CM | POA: Diagnosis not present

## 2019-04-12 DIAGNOSIS — M25511 Pain in right shoulder: Secondary | ICD-10-CM | POA: Diagnosis not present

## 2019-04-12 DIAGNOSIS — M542 Cervicalgia: Secondary | ICD-10-CM | POA: Diagnosis not present

## 2019-04-12 DIAGNOSIS — E871 Hypo-osmolality and hyponatremia: Secondary | ICD-10-CM | POA: Diagnosis not present

## 2019-04-14 DIAGNOSIS — R296 Repeated falls: Secondary | ICD-10-CM | POA: Diagnosis not present

## 2019-04-14 DIAGNOSIS — M25511 Pain in right shoulder: Secondary | ICD-10-CM | POA: Diagnosis not present

## 2019-04-14 DIAGNOSIS — M47812 Spondylosis without myelopathy or radiculopathy, cervical region: Secondary | ICD-10-CM | POA: Diagnosis not present

## 2019-04-14 DIAGNOSIS — E871 Hypo-osmolality and hyponatremia: Secondary | ICD-10-CM | POA: Diagnosis not present

## 2019-04-14 DIAGNOSIS — Z7982 Long term (current) use of aspirin: Secondary | ICD-10-CM | POA: Diagnosis not present

## 2019-04-30 DIAGNOSIS — Z7982 Long term (current) use of aspirin: Secondary | ICD-10-CM | POA: Diagnosis not present

## 2019-04-30 DIAGNOSIS — R296 Repeated falls: Secondary | ICD-10-CM | POA: Diagnosis not present

## 2019-04-30 DIAGNOSIS — E871 Hypo-osmolality and hyponatremia: Secondary | ICD-10-CM | POA: Diagnosis not present

## 2019-04-30 DIAGNOSIS — M25511 Pain in right shoulder: Secondary | ICD-10-CM | POA: Diagnosis not present

## 2019-04-30 DIAGNOSIS — M47812 Spondylosis without myelopathy or radiculopathy, cervical region: Secondary | ICD-10-CM | POA: Diagnosis not present

## 2019-05-12 ENCOUNTER — Ambulatory Visit (INDEPENDENT_AMBULATORY_CARE_PROVIDER_SITE_OTHER): Payer: PPO | Admitting: Urology

## 2019-05-12 DIAGNOSIS — N401 Enlarged prostate with lower urinary tract symptoms: Secondary | ICD-10-CM | POA: Diagnosis not present

## 2019-05-12 DIAGNOSIS — N3281 Overactive bladder: Secondary | ICD-10-CM

## 2019-05-17 ENCOUNTER — Other Ambulatory Visit: Payer: Self-pay | Admitting: Urology

## 2019-05-17 MED ORDER — MITOMYCIN CHEMO FOR BLADDER INSTILLATION 40 MG: 5.0000 mg | Freq: Once | INTRAVENOUS | Status: AC

## 2019-05-26 NOTE — Patient Instructions (Addendum)
Your procedure is scheduled on:05/31/2019   Report to [facility] at   6:30  AM.  Call this number if you have problems the morning of surgery: [phone]   Remember:   Do not Eat or Drink after midnight   :  Take these medicines the morning of surgery with A SIP OF WATER: Amlodipine, Gabapentin, Synthroid, Zoloft, and prilosec   Do not wear jewelry, make-up or nail polish.  Do not wear lotions, powders, or perfumes. You may wear deodorant.  Do not shave 48 hours prior to surgery. Men may shave face and neck.  Do not bring valuables to the hospital.  Contacts, dentures or bridgework may not be worn into surgery.  Leave suitcase in the car. After surgery it may be brought to your room.  For patients admitted to the hospital, checkout time is 11:00 AM the day of discharge.   Patients discharged the day of surgery will not be allowed to drive home.    Cystoscopy Cystoscopy is a procedure that is used to help diagnose and sometimes treat conditions that affect the lower urinary tract. The lower urinary tract includes the bladder and the urethra. The urethra is the tube that drains urine from the bladder. Cystoscopy is done using a thin, tube-shaped instrument with a light and camera at the end (cystoscope). The cystoscope may be hard or flexible, depending on the goal of the procedure. The cystoscope is inserted through the urethra, into the bladder. Cystoscopy may be recommended if you have:  Urinary tract infections that keep coming back.  Blood in the urine (hematuria).  An inability to control when you urinate (urinary incontinence) or an overactive bladder.  Unusual cells found in a urine sample.  A blockage in the urethra, such as a urinary stone.  Painful urination.  An abnormality in the bladder found during an intravenous pyelogram (IVP) or CT scan. Cystoscopy may also be done to remove a sample of tissue to be examined under a microscope (biopsy). Tell a health care  provider about:  Any allergies you have.  All medicines you are taking, including vitamins, herbs, eye drops, creams, and over-the-counter medicines.  Any problems you or family members have had with anesthetic medicines.  Any blood disorders you have.  Any surgeries you have had.  Any medical conditions you have.  Whether you are pregnant or may be pregnant. What are the risks? Generally, this is a safe procedure. However, problems may occur, including:  Infection.  Bleeding.  Allergic reactions to medicines.  Damage to other structures or organs. What happens before the procedure?  Ask your health care provider about: ? Changing or stopping your regular medicines. This is especially important if you are taking diabetes medicines or blood thinners. ? Taking medicines such as aspirin and ibuprofen. These medicines can thin your blood. Do not take these medicines unless your health care provider tells you to take them. ? Taking over-the-counter medicines, vitamins, herbs, and supplements.  Follow instructions from your health care provider about eating or drinking restrictions.  Ask your health care provider what steps will be taken to help prevent infection. These may include: ? Washing skin with a germ-killing soap. ? Taking antibiotic medicine.  You may have an exam or testing, such as: ? X-rays of the bladder, urethra, or kidneys. ? Urine tests to check for signs of infection.  Plan to have someone take you home from the hospital or clinic. What happens during the procedure?   You will  be given one or more of the following: ? A medicine to help you relax (sedative). ? A medicine to numb the area (local anesthetic).  The area around the opening of your urethra will be cleaned.  The cystoscope will be passed through your urethra into your bladder.  Germ-free (sterile) fluid will flow through the cystoscope to fill your bladder. The fluid will stretch your  bladder so that your health care provider can clearly examine your bladder walls.  Your doctor will look at the urethra and bladder. Your doctor may take a biopsy or remove stones.  The cystoscope will be removed, and your bladder will be emptied. The procedure may vary among health care providers and hospitals. What can I expect after the procedure? After the procedure, it is common to have:  Some soreness or pain in your abdomen and urethra.  Urinary symptoms. These include: ? Mild pain or burning when you urinate. Pain should stop within a few minutes after you urinate. This may last for up to 1 week. ? A small amount of blood in your urine for several days. ? Feeling like you need to urinate but producing only a small amount of urine. Follow these instructions at home: Medicines  Take over-the-counter and prescription medicines only as told by your health care provider.  If you were prescribed an antibiotic medicine, take it as told by your health care provider. Do not stop taking the antibiotic even if you start to feel better. General instructions  Return to your normal activities as told by your health care provider. Ask your health care provider what activities are safe for you.  Do not drive for 24 hours if you were given a sedative during your procedure.  Watch for any blood in your urine. If the amount of blood in your urine increases, call your health care provider.  Follow instructions from your health care provider about eating or drinking restrictions.  If a tissue sample was removed for testing (biopsy) during your procedure, it is up to you to get your test results. Ask your health care provider, or the department that is doing the test, when your results will be ready.  Drink enough fluid to keep your urine pale yellow.  Keep all follow-up visits as told by your health care provider. This is important. Contact a health care provider if you:  Have pain that gets  worse or does not get better with medicine, especially pain when you urinate.  Have trouble urinating.  Have more blood in your urine. Get help right away if you:  Have blood clots in your urine.  Have abdominal pain.  Have a fever or chills.  Are unable to urinate. Summary  Cystoscopy is a procedure that is used to help diagnose and sometimes treat conditions that affect the lower urinary tract.  Cystoscopy is done using a thin, tube-shaped instrument with a light and camera at the end.  After the procedure, it is common to have some soreness or pain in your abdomen and urethra.  Watch for any blood in your urine. If the amount of blood in your urine increases, call your health care provider.  If you were prescribed an antibiotic medicine, take it as told by your health care provider. Do not stop taking the antibiotic even if you start to feel better. This information is not intended to replace advice given to you by your health care provider. Make sure you discuss any questions you have with your health  care provider. Document Released: 09/27/2000 Document Revised: 09/22/2018 Document Reviewed: 09/22/2018 Elsevier Patient Education  2020 [place] Anesthesia, Adult, Care After This sheet gives you information about how to care for yourself after your procedure. Your health care provider may also give you more specific instructions. If you have problems or questions, contact your health care provider. What can I expect after the procedure? After the procedure, the following side effects are common:  Pain or discomfort at the IV site.  Nausea.  Vomiting.  Sore throat.  Trouble concentrating.  Feeling cold or chills.  Weak or tired.  Sleepiness and fatigue.  Soreness and body aches. These side effects can affect parts of the body that were not involved in surgery. Follow these instructions at home:  For at least 24 hours after the procedure:  Have a  responsible adult stay with you. It is important to have someone help care for you until you are awake and alert.  Rest as needed.  Do not: ? Participate in activities in which you could fall or become injured. ? Drive. ? Use heavy machinery. ? Drink alcohol. ? Take sleeping pills or medicines that cause drowsiness. ? Make important decisions or sign legal documents. ? Take care of children on your own. Eating and drinking  Follow any instructions from your health care provider about eating or drinking restrictions.  When you feel hungry, start by eating small amounts of foods that are soft and easy to digest (bland), such as toast. Gradually return to your regular diet.  Drink enough fluid to keep your urine pale yellow.  If you vomit, rehydrate by drinking water, juice, or clear broth. General instructions  If you have sleep apnea, surgery and certain medicines can increase your risk for breathing problems. Follow instructions from your health care provider about wearing your sleep device: ? Anytime you are sleeping, including during daytime naps. ? While taking prescription pain medicines, sleeping medicines, or medicines that make you drowsy.  Return to your normal activities as told by your health care provider. Ask your health care provider what activities are safe for you.  Take over-the-counter and prescription medicines only as told by your health care provider.  If you smoke, do not smoke without supervision.  Keep all follow-up visits as told by your health care provider. This is important. Contact a health care provider if:  You have nausea or vomiting that does not get better with medicine.  You cannot eat or drink without vomiting.  You have pain that does not get better with medicine.  You are unable to pass urine.  You develop a skin rash.  You have a fever.  You have redness around your IV site that gets worse. Get help right away if:  You have  difficulty breathing.  You have chest pain.  You have blood in your urine or stool, or you vomit blood. Summary  After the procedure, it is common to have a sore throat or nausea. It is also common to feel tired.  Have a responsible adult stay with you for the first 24 hours after general anesthesia. It is important to have someone help care for you until you are awake and alert.  When you feel hungry, start by eating small amounts of foods that are soft and easy to digest (bland), such as toast. Gradually return to your regular diet.  Drink enough fluid to keep your urine pale yellow.  Return to your normal activities as told  by your health care provider. Ask your health care provider what activities are safe for you. This information is not intended to replace advice given to you by your health care provider. Make sure you discuss any questions you have with your health care provider. Document Released: 01/06/2001 Document Revised: 10/03/2017 Document Reviewed: 05/16/2017 Elsevier Patient Education  2020 [employer].

## 2019-05-27 DIAGNOSIS — Z6824 Body mass index (BMI) 24.0-24.9, adult: Secondary | ICD-10-CM | POA: Diagnosis not present

## 2019-05-27 DIAGNOSIS — R300 Dysuria: Secondary | ICD-10-CM | POA: Diagnosis not present

## 2019-05-27 DIAGNOSIS — R5383 Other fatigue: Secondary | ICD-10-CM | POA: Diagnosis not present

## 2019-05-27 DIAGNOSIS — R410 Disorientation, unspecified: Secondary | ICD-10-CM | POA: Diagnosis not present

## 2019-05-28 ENCOUNTER — Other Ambulatory Visit (HOSPITAL_COMMUNITY)
Admission: RE | Admit: 2019-05-28 | Discharge: 2019-05-28 | Disposition: A | Discharge status: Home / Self Care | Payer: PPO | Source: Ambulatory Visit | Attending: Urology | Admitting: Urology

## 2019-05-28 ENCOUNTER — Other Ambulatory Visit: Payer: Self-pay

## 2019-05-28 ENCOUNTER — Encounter (HOSPITAL_COMMUNITY)
Admission: RE | Admit: 2019-05-28 | Discharge: 2019-05-28 | Disposition: A | Discharge status: Home / Self Care | Payer: PPO | Source: Ambulatory Visit | Attending: Urology | Admitting: Urology

## 2019-05-28 ENCOUNTER — Encounter (HOSPITAL_COMMUNITY): Payer: Self-pay

## 2019-05-28 DIAGNOSIS — R410 Disorientation, unspecified: Secondary | ICD-10-CM | POA: Diagnosis not present

## 2019-05-31 ENCOUNTER — Encounter (HOSPITAL_COMMUNITY): Admission: RE | Payer: Self-pay | Source: Ambulatory Visit

## 2019-05-31 ENCOUNTER — Ambulatory Visit (HOSPITAL_COMMUNITY): Admission: RE | Admit: 2019-05-31 | Payer: PPO | Source: Ambulatory Visit | Admitting: Urology

## 2019-05-31 SURGERY — RESECTION, BLADDER NECK, TRANSURETHRAL
Anesthesia: General

## 2019-06-03 DIAGNOSIS — J449 Chronic obstructive pulmonary disease, unspecified: Secondary | ICD-10-CM | POA: Diagnosis not present

## 2019-06-03 DIAGNOSIS — E871 Hypo-osmolality and hyponatremia: Secondary | ICD-10-CM | POA: Diagnosis not present

## 2019-06-03 DIAGNOSIS — R451 Restlessness and agitation: Secondary | ICD-10-CM | POA: Diagnosis not present

## 2019-06-03 DIAGNOSIS — Z79899 Other long term (current) drug therapy: Secondary | ICD-10-CM | POA: Diagnosis not present

## 2019-06-03 DIAGNOSIS — R05 Cough: Secondary | ICD-10-CM | POA: Diagnosis not present

## 2019-06-03 DIAGNOSIS — Z20828 Contact with and (suspected) exposure to other viral communicable diseases: Secondary | ICD-10-CM | POA: Diagnosis not present

## 2019-06-03 DIAGNOSIS — R4182 Altered mental status, unspecified: Secondary | ICD-10-CM | POA: Diagnosis not present

## 2019-06-03 DIAGNOSIS — R410 Disorientation, unspecified: Secondary | ICD-10-CM | POA: Diagnosis not present

## 2019-06-03 DIAGNOSIS — Z7982 Long term (current) use of aspirin: Secondary | ICD-10-CM | POA: Diagnosis not present

## 2019-06-03 DIAGNOSIS — I700 Atherosclerosis of aorta: Secondary | ICD-10-CM | POA: Diagnosis not present

## 2019-06-03 DIAGNOSIS — R0602 Shortness of breath: Secondary | ICD-10-CM | POA: Diagnosis not present

## 2019-06-03 DIAGNOSIS — I10 Essential (primary) hypertension: Secondary | ICD-10-CM | POA: Diagnosis not present

## 2019-06-03 DIAGNOSIS — Z887 Allergy status to serum and vaccine status: Secondary | ICD-10-CM | POA: Diagnosis not present

## 2019-06-03 DIAGNOSIS — Z87891 Personal history of nicotine dependence: Secondary | ICD-10-CM | POA: Diagnosis not present

## 2019-06-04 DIAGNOSIS — F331 Major depressive disorder, recurrent, moderate: Secondary | ICD-10-CM | POA: Diagnosis not present

## 2019-06-05 DIAGNOSIS — I9589 Other hypotension: Secondary | ICD-10-CM | POA: Diagnosis not present

## 2019-06-05 DIAGNOSIS — F39 Unspecified mood [affective] disorder: Secondary | ICD-10-CM | POA: Diagnosis not present

## 2019-06-05 DIAGNOSIS — H5330 Unspecified disorder of binocular vision: Secondary | ICD-10-CM | POA: Diagnosis not present

## 2019-06-05 DIAGNOSIS — Z5181 Encounter for therapeutic drug level monitoring: Secondary | ICD-10-CM | POA: Diagnosis not present

## 2019-06-05 DIAGNOSIS — K5900 Constipation, unspecified: Secondary | ICD-10-CM | POA: Diagnosis not present

## 2019-06-05 DIAGNOSIS — I10 Essential (primary) hypertension: Secondary | ICD-10-CM | POA: Diagnosis not present

## 2019-06-05 DIAGNOSIS — R42 Dizziness and giddiness: Secondary | ICD-10-CM | POA: Diagnosis not present

## 2019-06-05 DIAGNOSIS — R51 Headache: Secondary | ICD-10-CM | POA: Diagnosis not present

## 2019-06-05 DIAGNOSIS — Z130 Encounter for screening for diseases of the blood and blood-forming organs and certain disorders involving the immune mechanism: Secondary | ICD-10-CM | POA: Diagnosis not present

## 2019-06-05 DIAGNOSIS — Z131 Encounter for screening for diabetes mellitus: Secondary | ICD-10-CM | POA: Diagnosis not present

## 2019-06-05 DIAGNOSIS — R12 Heartburn: Secondary | ICD-10-CM | POA: Diagnosis not present

## 2019-06-05 DIAGNOSIS — F0391 Unspecified dementia with behavioral disturbance: Secondary | ICD-10-CM | POA: Diagnosis not present

## 2019-06-05 DIAGNOSIS — E559 Vitamin D deficiency, unspecified: Secondary | ICD-10-CM | POA: Diagnosis not present

## 2019-06-05 DIAGNOSIS — R5383 Other fatigue: Secondary | ICD-10-CM | POA: Diagnosis not present

## 2019-06-05 DIAGNOSIS — E860 Dehydration: Secondary | ICD-10-CM | POA: Diagnosis not present

## 2019-06-05 DIAGNOSIS — Z13228 Encounter for screening for other metabolic disorders: Secondary | ICD-10-CM | POA: Diagnosis not present

## 2019-06-05 DIAGNOSIS — J984 Other disorders of lung: Secondary | ICD-10-CM | POA: Diagnosis not present

## 2019-06-05 DIAGNOSIS — E782 Mixed hyperlipidemia: Secondary | ICD-10-CM | POA: Diagnosis not present

## 2019-06-05 DIAGNOSIS — Z13220 Encounter for screening for lipoid disorders: Secondary | ICD-10-CM | POA: Diagnosis not present

## 2019-06-05 DIAGNOSIS — R3915 Urgency of urination: Secondary | ICD-10-CM | POA: Diagnosis not present

## 2019-06-05 DIAGNOSIS — R7309 Other abnormal glucose: Secondary | ICD-10-CM | POA: Diagnosis not present

## 2019-06-05 DIAGNOSIS — Z712 Person consulting for explanation of examination or test findings: Secondary | ICD-10-CM | POA: Diagnosis not present

## 2019-06-05 DIAGNOSIS — D510 Vitamin B12 deficiency anemia due to intrinsic factor deficiency: Secondary | ICD-10-CM | POA: Diagnosis not present

## 2019-06-05 DIAGNOSIS — F419 Anxiety disorder, unspecified: Secondary | ICD-10-CM | POA: Diagnosis not present

## 2019-06-05 DIAGNOSIS — Z79899 Other long term (current) drug therapy: Secondary | ICD-10-CM | POA: Diagnosis not present

## 2019-06-05 DIAGNOSIS — R946 Abnormal results of thyroid function studies: Secondary | ICD-10-CM | POA: Diagnosis not present

## 2019-06-05 DIAGNOSIS — G459 Transient cerebral ischemic attack, unspecified: Secondary | ICD-10-CM | POA: Diagnosis not present

## 2019-06-05 DIAGNOSIS — Z1329 Encounter for screening for other suspected endocrine disorder: Secondary | ICD-10-CM | POA: Diagnosis not present

## 2019-06-05 DIAGNOSIS — R4781 Slurred speech: Secondary | ICD-10-CM | POA: Diagnosis not present

## 2019-06-05 DIAGNOSIS — J449 Chronic obstructive pulmonary disease, unspecified: Secondary | ICD-10-CM | POA: Diagnosis not present

## 2019-06-05 DIAGNOSIS — N400 Enlarged prostate without lower urinary tract symptoms: Secondary | ICD-10-CM | POA: Diagnosis not present

## 2019-06-05 DIAGNOSIS — E785 Hyperlipidemia, unspecified: Secondary | ICD-10-CM | POA: Diagnosis not present

## 2019-06-06 DIAGNOSIS — F0391 Unspecified dementia with behavioral disturbance: Secondary | ICD-10-CM | POA: Diagnosis not present

## 2019-06-07 DIAGNOSIS — F0391 Unspecified dementia with behavioral disturbance: Secondary | ICD-10-CM | POA: Diagnosis not present

## 2019-06-07 DIAGNOSIS — Z5181 Encounter for therapeutic drug level monitoring: Secondary | ICD-10-CM | POA: Diagnosis not present

## 2019-06-07 DIAGNOSIS — Z130 Encounter for screening for diseases of the blood and blood-forming organs and certain disorders involving the immune mechanism: Secondary | ICD-10-CM | POA: Diagnosis not present

## 2019-06-07 DIAGNOSIS — Z13228 Encounter for screening for other metabolic disorders: Secondary | ICD-10-CM | POA: Diagnosis not present

## 2019-06-07 DIAGNOSIS — Z131 Encounter for screening for diabetes mellitus: Secondary | ICD-10-CM | POA: Diagnosis not present

## 2019-06-07 DIAGNOSIS — E785 Hyperlipidemia, unspecified: Secondary | ICD-10-CM | POA: Diagnosis not present

## 2019-06-07 DIAGNOSIS — D510 Vitamin B12 deficiency anemia due to intrinsic factor deficiency: Secondary | ICD-10-CM | POA: Diagnosis not present

## 2019-06-07 DIAGNOSIS — E559 Vitamin D deficiency, unspecified: Secondary | ICD-10-CM | POA: Diagnosis not present

## 2019-06-07 DIAGNOSIS — Z1329 Encounter for screening for other suspected endocrine disorder: Secondary | ICD-10-CM | POA: Diagnosis not present

## 2019-06-07 DIAGNOSIS — Z79899 Other long term (current) drug therapy: Secondary | ICD-10-CM | POA: Diagnosis not present

## 2019-06-07 DIAGNOSIS — R946 Abnormal results of thyroid function studies: Secondary | ICD-10-CM | POA: Diagnosis not present

## 2019-06-07 DIAGNOSIS — Z13220 Encounter for screening for lipoid disorders: Secondary | ICD-10-CM | POA: Diagnosis not present

## 2019-06-07 DIAGNOSIS — R7309 Other abnormal glucose: Secondary | ICD-10-CM | POA: Diagnosis not present

## 2019-06-08 DIAGNOSIS — F0391 Unspecified dementia with behavioral disturbance: Secondary | ICD-10-CM | POA: Diagnosis not present

## 2019-06-09 DIAGNOSIS — F0391 Unspecified dementia with behavioral disturbance: Secondary | ICD-10-CM | POA: Diagnosis not present

## 2019-06-10 DIAGNOSIS — F0391 Unspecified dementia with behavioral disturbance: Secondary | ICD-10-CM | POA: Diagnosis not present

## 2019-06-11 DIAGNOSIS — F0391 Unspecified dementia with behavioral disturbance: Secondary | ICD-10-CM | POA: Diagnosis not present

## 2019-06-16 DIAGNOSIS — F0391 Unspecified dementia with behavioral disturbance: Secondary | ICD-10-CM | POA: Diagnosis not present

## 2019-06-17 DIAGNOSIS — R4781 Slurred speech: Secondary | ICD-10-CM | POA: Diagnosis not present

## 2019-06-17 DIAGNOSIS — Z13228 Encounter for screening for other metabolic disorders: Secondary | ICD-10-CM | POA: Diagnosis not present

## 2019-06-17 DIAGNOSIS — R51 Headache: Secondary | ICD-10-CM | POA: Diagnosis not present

## 2019-06-17 DIAGNOSIS — Z79899 Other long term (current) drug therapy: Secondary | ICD-10-CM | POA: Diagnosis not present

## 2019-06-17 DIAGNOSIS — Z130 Encounter for screening for diseases of the blood and blood-forming organs and certain disorders involving the immune mechanism: Secondary | ICD-10-CM | POA: Diagnosis not present

## 2019-06-17 DIAGNOSIS — R42 Dizziness and giddiness: Secondary | ICD-10-CM | POA: Diagnosis not present

## 2019-06-17 DIAGNOSIS — J984 Other disorders of lung: Secondary | ICD-10-CM | POA: Diagnosis not present

## 2019-07-09 DIAGNOSIS — D500 Iron deficiency anemia secondary to blood loss (chronic): Secondary | ICD-10-CM | POA: Diagnosis not present

## 2019-07-09 DIAGNOSIS — F331 Major depressive disorder, recurrent, moderate: Secondary | ICD-10-CM | POA: Diagnosis not present

## 2019-07-09 DIAGNOSIS — I10 Essential (primary) hypertension: Secondary | ICD-10-CM | POA: Diagnosis not present

## 2019-07-09 DIAGNOSIS — Z23 Encounter for immunization: Secondary | ICD-10-CM | POA: Diagnosis not present

## 2019-07-09 DIAGNOSIS — E871 Hypo-osmolality and hyponatremia: Secondary | ICD-10-CM | POA: Diagnosis not present

## 2019-07-09 DIAGNOSIS — Z6826 Body mass index (BMI) 26.0-26.9, adult: Secondary | ICD-10-CM | POA: Diagnosis not present

## 2019-07-09 DIAGNOSIS — G301 Alzheimer's disease with late onset: Secondary | ICD-10-CM | POA: Diagnosis not present

## 2019-07-09 DIAGNOSIS — N401 Enlarged prostate with lower urinary tract symptoms: Secondary | ICD-10-CM | POA: Diagnosis not present

## 2019-07-13 DIAGNOSIS — E871 Hypo-osmolality and hyponatremia: Secondary | ICD-10-CM | POA: Diagnosis not present

## 2019-07-13 DIAGNOSIS — M47812 Spondylosis without myelopathy or radiculopathy, cervical region: Secondary | ICD-10-CM | POA: Diagnosis not present

## 2019-07-13 DIAGNOSIS — R296 Repeated falls: Secondary | ICD-10-CM | POA: Diagnosis not present

## 2019-07-13 DIAGNOSIS — M25511 Pain in right shoulder: Secondary | ICD-10-CM | POA: Diagnosis not present

## 2019-07-13 DIAGNOSIS — Z7982 Long term (current) use of aspirin: Secondary | ICD-10-CM | POA: Diagnosis not present

## 2019-07-14 ENCOUNTER — Other Ambulatory Visit: Payer: Self-pay | Admitting: Urology

## 2019-07-16 ENCOUNTER — Other Ambulatory Visit: Payer: Self-pay

## 2019-07-16 ENCOUNTER — Encounter (HOSPITAL_COMMUNITY)
Admission: RE | Admit: 2019-07-16 | Discharge: 2019-07-16 | Disposition: A | Discharge status: Home / Self Care | Payer: PPO | Source: Ambulatory Visit | Attending: Urology | Admitting: Urology

## 2019-07-16 ENCOUNTER — Encounter (HOSPITAL_COMMUNITY): Payer: Self-pay

## 2019-07-16 ENCOUNTER — Other Ambulatory Visit (HOSPITAL_COMMUNITY)
Admission: RE | Admit: 2019-07-16 | Discharge: 2019-07-16 | Disposition: A | Discharge status: Home / Self Care | Payer: PPO | Source: Ambulatory Visit | Attending: Urology | Admitting: Urology

## 2019-07-16 DIAGNOSIS — Z01818 Encounter for other preprocedural examination: Secondary | ICD-10-CM | POA: Diagnosis not present

## 2019-07-16 DIAGNOSIS — Z20828 Contact with and (suspected) exposure to other viral communicable diseases: Secondary | ICD-10-CM | POA: Insufficient documentation

## 2019-07-16 DIAGNOSIS — R9431 Abnormal electrocardiogram [ECG] [EKG]: Secondary | ICD-10-CM | POA: Insufficient documentation

## 2019-07-16 DIAGNOSIS — N320 Bladder-neck obstruction: Secondary | ICD-10-CM | POA: Diagnosis not present

## 2019-07-16 LAB — CBC
HCT: 34.7 % — ABNORMAL LOW (ref 39.0–52.0)
Hemoglobin: 10.4 g/dL — ABNORMAL LOW (ref 13.0–17.0)
MCH: 27.4 pg (ref 26.0–34.0)
MCHC: 30 g/dL (ref 30.0–36.0)
MCV: 91.3 fL (ref 80.0–100.0)
Platelets: 312 10*3/uL (ref 150–400)
RBC: 3.8 MIL/uL — ABNORMAL LOW (ref 4.22–5.81)
RDW: 14.4 % (ref 11.5–15.5)
WBC: 13.1 10*3/uL — ABNORMAL HIGH (ref 4.0–10.5)
nRBC: 0 % (ref 0.0–0.2)

## 2019-07-16 LAB — BASIC METABOLIC PANEL
Anion gap: 7 (ref 5–15)
BUN: 24 mg/dL — ABNORMAL HIGH (ref 8–23)
CO2: 26 mmol/L (ref 22–32)
Calcium: 8.9 mg/dL (ref 8.9–10.3)
Chloride: 102 mmol/L (ref 98–111)
Creatinine, Ser: 0.94 mg/dL (ref 0.61–1.24)
GFR calc Af Amer: >60 mL/min (ref >60)
GFR calc non Af Amer: >60 mL/min (ref >60)
Glucose, Bld: 109 mg/dL — ABNORMAL HIGH (ref 70–99)
Potassium: 4.6 mmol/L (ref 3.5–5.1)
Sodium: 135 mmol/L (ref 135–145)

## 2019-07-16 NOTE — Patient Instructions (Signed)
Christian PIZZOFERRATO Sr.  07/16/2019     @PREFPERIOPPHARMACY @   Your procedure is scheduled on 07/19/19.  Report to [facility] at 0900 A.M.  Call this number if you have problems the morning of surgery:  [phone]   Remember:  Do not eat or drink after midnight.      Take these medicines the morning of surgery with A SIP OF WATER Uroxitrol, Flomax, Norvasc, Depakote, Aricept, Neurontin, Synthroid, Namenda, Zyprexa, Prilosec, Zoloft    Do not wear jewelry, make-up or nail polish.  Do not wear lotions, powders, or perfumes, or deodorant.  Do not shave 48 hours prior to surgery.  Men may shave face and neck.  Do not bring valuables to the hospital.  [facility] is not responsible for any belongings or valuables.  Contacts, dentures or bridgework may not be worn into surgery.  Leave your suitcase in the car.  After surgery it may be brought to your room.  For patients admitted to the hospital, discharge time will be determined by your treatment team.  Patients discharged the day of surgery will not be allowed to drive home.    Please read over the following fact sheets that you were given. Surgical Site Infection Prevention and Anesthesia Post-op Instructions     PATIENT INSTRUCTIONS POST-ANESTHESIA  IMMEDIATELY FOLLOWING SURGERY:  Do not drive or operate machinery for the first twenty four hours after surgery.  Do not make any important decisions for twenty four hours after surgery or while taking narcotic pain medications or sedatives.  If you develop intractable nausea and vomiting or a severe headache please notify your doctor immediately.  FOLLOW-UP:  Please make an appointment with your surgeon as instructed. You do not need to follow up with anesthesia unless specifically instructed to do so.  WOUND CARE INSTRUCTIONS (if applicable):  Keep a dry clean dressing on the anesthesia/puncture wound site if there is drainage.  Once the wound has quit draining you may leave it open  to air.  Generally you should leave the bandage intact for twenty four hours unless there is drainage.  If the epidural site drains for more than 36-48 hours please call the anesthesia department.  QUESTIONS?:  Please feel free to call your physician or the hospital operator if you have any questions, and they will be happy to assist you.      Cystoscopy Cystoscopy is a procedure that is used to help diagnose and sometimes treat conditions that affect the lower urinary tract. The lower urinary tract includes the bladder and the urethra. The urethra is the tube that drains urine from the bladder. Cystoscopy is done using a thin, tube-shaped instrument with a light and camera at the end (cystoscope). The cystoscope may be hard or flexible, depending on the goal of the procedure. The cystoscope is inserted through the urethra, into the bladder. Cystoscopy may be recommended if you have:  Urinary tract infections that keep coming back.  Blood in the urine (hematuria).  An inability to control when you urinate (urinary incontinence) or an overactive bladder.  Unusual cells found in a urine sample.  A blockage in the urethra, such as a urinary stone.  Painful urination.  An abnormality in the bladder found during an intravenous pyelogram (IVP) or CT scan. Cystoscopy may also be done to remove a sample of tissue to be examined under a microscope (biopsy). Tell a health care provider about:  Any allergies you have.  All medicines you are taking, including  vitamins, herbs, eye drops, creams, and over-the-counter medicines.  Any problems you or family members have had with anesthetic medicines.  Any blood disorders you have.  Any surgeries you have had.  Any medical conditions you have.  Whether you are pregnant or may be pregnant. What are the risks? Generally, this is a safe procedure. However, problems may occur, including:  Infection.  Bleeding.  Allergic reactions to  medicines.  Damage to other structures or organs. What happens before the procedure?  Ask your health care provider about: ? Changing or stopping your regular medicines. This is especially important if you are taking diabetes medicines or blood thinners. ? Taking medicines such as aspirin and ibuprofen. These medicines can thin your blood. Do not take these medicines unless your health care provider tells you to take them. ? Taking over-the-counter medicines, vitamins, herbs, and supplements.  Follow instructions from your health care provider about eating or drinking restrictions.  Ask your health care provider what steps will be taken to help prevent infection. These may include: ? Washing skin with a germ-killing soap. ? Taking antibiotic medicine.  You may have an exam or testing, such as: ? X-rays of the bladder, urethra, or kidneys. ? Urine tests to check for signs of infection.  Plan to have someone take you home from the hospital or clinic. What happens during the procedure?   You will be given one or more of the following: ? A medicine to help you relax (sedative). ? A medicine to numb the area (local anesthetic).  The area around the opening of your urethra will be cleaned.  The cystoscope will be passed through your urethra into your bladder.  Germ-free (sterile) fluid will flow through the cystoscope to fill your bladder. The fluid will stretch your bladder so that your health care provider can clearly examine your bladder walls.  Your doctor will look at the urethra and bladder. Your doctor may take a biopsy or remove stones.  The cystoscope will be removed, and your bladder will be emptied. The procedure may vary among health care providers and hospitals. What can I expect after the procedure? After the procedure, it is common to have:  Some soreness or pain in your abdomen and urethra.  Urinary symptoms. These include: ? Mild pain or burning when you  urinate. Pain should stop within a few minutes after you urinate. This may last for up to 1 week. ? A small amount of blood in your urine for several days. ? Feeling like you need to urinate but producing only a small amount of urine. Follow these instructions at home: Medicines  Take over-the-counter and prescription medicines only as told by your health care provider.  If you were prescribed an antibiotic medicine, take it as told by your health care provider. Do not stop taking the antibiotic even if you start to feel better. General instructions  Return to your normal activities as told by your health care provider. Ask your health care provider what activities are safe for you.  Do not drive for 24 hours if you were given a sedative during your procedure.  Watch for any blood in your urine. If the amount of blood in your urine increases, call your health care provider.  Follow instructions from your health care provider about eating or drinking restrictions.  If a tissue sample was removed for testing (biopsy) during your procedure, it is up to you to get your test results. Ask your health care provider, or  the department that is doing the test, when your results will be ready.  Drink enough fluid to keep your urine pale yellow.  Keep all follow-up visits as told by your health care provider. This is important. Contact a health care provider if you:  Have pain that gets worse or does not get better with medicine, especially pain when you urinate.  Have trouble urinating.  Have more blood in your urine. Get help right away if you:  Have blood clots in your urine.  Have abdominal pain.  Have a fever or chills.  Are unable to urinate. Summary  Cystoscopy is a procedure that is used to help diagnose and sometimes treat conditions that affect the lower urinary tract.  Cystoscopy is done using a thin, tube-shaped instrument with a light and camera at the end.  After the  procedure, it is common to have some soreness or pain in your abdomen and urethra.  Watch for any blood in your urine. If the amount of blood in your urine increases, call your health care provider.  If you were prescribed an antibiotic medicine, take it as told by your health care provider. Do not stop taking the antibiotic even if you start to feel better. This information is not intended to replace advice given to you by your health care provider. Make sure you discuss any questions you have with your health care provider. Document Released: 09/27/2000 Document Revised: 09/22/2018 Document Reviewed: 09/22/2018 Elsevier Patient Education  2020 [employer].

## 2019-07-17 LAB — SARS CORONAVIRUS 2 (TAT 6-24 HRS): SARS Coronavirus 2: NEGATIVE

## 2019-07-19 ENCOUNTER — Encounter (HOSPITAL_COMMUNITY): Payer: Self-pay | Admitting: Anesthesiology

## 2019-07-19 ENCOUNTER — Ambulatory Visit (HOSPITAL_COMMUNITY): Payer: PPO | Admitting: Anesthesiology

## 2019-07-19 ENCOUNTER — Ambulatory Visit (HOSPITAL_COMMUNITY)
Admission: RE | Admit: 2019-07-19 | Discharge: 2019-07-19 | Disposition: A | Discharge status: Home / Self Care | Payer: PPO | Source: Ambulatory Visit | Attending: Urology | Admitting: Urology

## 2019-07-19 ENCOUNTER — Encounter (HOSPITAL_COMMUNITY)
Admission: RE | Disposition: A | Discharge status: Home / Self Care | Payer: Self-pay | Source: Ambulatory Visit | Attending: Urology

## 2019-07-19 DIAGNOSIS — K219 Gastro-esophageal reflux disease without esophagitis: Secondary | ICD-10-CM | POA: Insufficient documentation

## 2019-07-19 DIAGNOSIS — N401 Enlarged prostate with lower urinary tract symptoms: Secondary | ICD-10-CM | POA: Diagnosis not present

## 2019-07-19 DIAGNOSIS — F419 Anxiety disorder, unspecified: Secondary | ICD-10-CM | POA: Diagnosis not present

## 2019-07-19 DIAGNOSIS — M1990 Unspecified osteoarthritis, unspecified site: Secondary | ICD-10-CM | POA: Diagnosis not present

## 2019-07-19 DIAGNOSIS — Z87440 Personal history of urinary (tract) infections: Secondary | ICD-10-CM | POA: Diagnosis not present

## 2019-07-19 DIAGNOSIS — R3912 Poor urinary stream: Secondary | ICD-10-CM | POA: Insufficient documentation

## 2019-07-19 DIAGNOSIS — Z87891 Personal history of nicotine dependence: Secondary | ICD-10-CM | POA: Diagnosis not present

## 2019-07-19 DIAGNOSIS — I10 Essential (primary) hypertension: Secondary | ICD-10-CM | POA: Insufficient documentation

## 2019-07-19 DIAGNOSIS — J449 Chronic obstructive pulmonary disease, unspecified: Secondary | ICD-10-CM | POA: Diagnosis not present

## 2019-07-19 DIAGNOSIS — E039 Hypothyroidism, unspecified: Secondary | ICD-10-CM | POA: Diagnosis not present

## 2019-07-19 DIAGNOSIS — N320 Bladder-neck obstruction: Secondary | ICD-10-CM | POA: Insufficient documentation

## 2019-07-19 HISTORY — PX: CYSTOSCOPY WITH INJECTION: SHX1424

## 2019-07-19 HISTORY — PX: TRANSURETHRAL INCISION OF BLADDER NECK: SHX6152

## 2019-07-19 SURGERY — CYSTOSCOPY, WITH INJECTION OF BLADDER NECK OR BLADDER WALL
Anesthesia: General

## 2019-07-19 MED ORDER — HYDROMORPHONE HCL 1 MG/ML IJ SOLN: 0.2500 mg | INTRAMUSCULAR | Status: DC | PRN

## 2019-07-19 MED ORDER — STERILE WATER FOR IRRIGATION IR SOLN
Status: DC | PRN
  Administered 2019-07-19: 3000 mL

## 2019-07-19 MED ORDER — LIDOCAINE 2% (20 MG/ML) 5 ML SYRINGE
INTRAMUSCULAR | Status: DC | PRN
  Administered 2019-07-19: 100 mg via INTRAVENOUS

## 2019-07-19 MED ORDER — LACTATED RINGERS IV SOLN
Freq: Once | INTRAVENOUS | Status: AC
  Administered 2019-07-19: 10:00:00 via INTRAVENOUS

## 2019-07-19 MED ORDER — FENTANYL CITRATE (PF) 100 MCG/2ML IJ SOLN
INTRAMUSCULAR | Status: DC | PRN
  Administered 2019-07-19: 25 ug via INTRAVENOUS

## 2019-07-19 MED ORDER — LACTATED RINGERS IV SOLN
INTRAVENOUS | Status: DC | PRN
  Administered 2019-07-19: 10:00:00 via INTRAVENOUS

## 2019-07-19 MED ORDER — LIDOCAINE 2% (20 MG/ML) 5 ML SYRINGE
INTRAMUSCULAR | Status: AC
  Filled 2019-07-19: qty 5

## 2019-07-19 MED ORDER — ONDANSETRON HCL 4 MG/2ML IJ SOLN
INTRAMUSCULAR | Status: DC | PRN
  Administered 2019-07-19: 4 mg via INTRAVENOUS

## 2019-07-19 MED ORDER — WATER FOR IRRIGATION, STERILE IR SOLN
Status: DC | PRN
  Administered 2019-07-19: 500 mL

## 2019-07-19 MED ORDER — HYDROCODONE-ACETAMINOPHEN 5-325 MG PO TABS: 1.0000 | ORAL_TABLET | ORAL | 0 refills | Status: DC | PRN

## 2019-07-19 MED ORDER — ONDANSETRON HCL 4 MG/2ML IJ SOLN: 4.0000 mg | Freq: Once | INTRAMUSCULAR | Status: DC | PRN

## 2019-07-19 MED ORDER — GLYCOPYRROLATE 0.2 MG/ML IJ SOLN
INTRAMUSCULAR | Status: DC | PRN
  Administered 2019-07-19: 0.2 mg via INTRAVENOUS

## 2019-07-19 MED ORDER — MITOMYCIN CHEMO FOR BLADDER INSTILLATION 20 MG
5.0000 mg | Freq: Once | INTRAVENOUS | Status: DC
  Filled 2019-07-19: qty 5

## 2019-07-19 MED ORDER — CEFAZOLIN SODIUM-DEXTROSE 2-4 GM/100ML-% IV SOLN
2.0000 g | INTRAVENOUS | Status: AC
  Administered 2019-07-19: 10:00:00 2 g via INTRAVENOUS
  Filled 2019-07-19: qty 100

## 2019-07-19 MED ORDER — FENTANYL CITRATE (PF) 100 MCG/2ML IJ SOLN
INTRAMUSCULAR | Status: AC
  Filled 2019-07-19: qty 2

## 2019-07-19 MED ORDER — PROPOFOL 10 MG/ML IV BOLUS
INTRAVENOUS | Status: AC
  Filled 2019-07-19: qty 20

## 2019-07-19 MED ORDER — PROPOFOL 10 MG/ML IV BOLUS
INTRAVENOUS | Status: DC | PRN
  Administered 2019-07-19: 140 mg via INTRAVENOUS

## 2019-07-19 MED ORDER — MITOMYCIN CHEMO FOR BLADDER INSTILLATION 40 MG
INTRAVENOUS | Status: DC | PRN
  Administered 2019-07-19: 5 mg via INTRAVESICAL

## 2019-07-19 SURGICAL SUPPLY — 24 items
BAG DRAIN URO TABLE W/ADPT NS (BAG) ×3 IMPLANT
BAG URINE DRAINAGE (UROLOGICAL SUPPLIES) ×2 IMPLANT
CATH FOLEY LATEX FREE 20FR (CATHETERS) ×2
CATH FOLEY LF 20FR (CATHETERS) IMPLANT
CLOTH BEACON ORANGE TIMEOUT ST (SAFETY) ×3 IMPLANT
GLOVE BIOGEL M 8.0 STRL (GLOVE) ×3 IMPLANT
GLOVE BIOGEL PI IND STRL 7.0 (GLOVE) ×2 IMPLANT
GLOVE BIOGEL PI INDICATOR 7.0 (GLOVE) ×4
GOWN STRL REUS W/ TWL XL LVL3 (GOWN DISPOSABLE) ×1 IMPLANT
GOWN STRL REUS W/TWL XL LVL3 (GOWN DISPOSABLE) ×5 IMPLANT
KIT TURNOVER CYSTO (KITS) ×3 IMPLANT
MANIFOLD NEPTUNE II (INSTRUMENTS) ×3 IMPLANT
NDL ASPIRATION 22 (NEEDLE) ×1 IMPLANT
NDL HYPO 18GX1.5 BLUNT FILL (NEEDLE) ×1 IMPLANT
NEEDLE ASPIRATION 22 (NEEDLE) ×3 IMPLANT
NEEDLE HYPO 18GX1.5 BLUNT FILL (NEEDLE) ×3 IMPLANT
PACK CYSTO (CUSTOM PROCEDURE TRAY) ×3 IMPLANT
PAD ARMBOARD 7.5X6 YLW CONV (MISCELLANEOUS) ×3 IMPLANT
SCALPEL HALF MOON BLADE (MISCELLANEOUS) ×2 IMPLANT
SYR 20ML LL LF (SYRINGE) ×3 IMPLANT
SYR CONTROL 10ML LL (SYRINGE) ×3 IMPLANT
TOWEL OR 17X26 4PK STRL BLUE (TOWEL DISPOSABLE) ×3 IMPLANT
WATER STERILE IRR 3000ML UROMA (IV SOLUTION) ×3 IMPLANT
WATER STERILE IRR 500ML POUR (IV SOLUTION) ×3 IMPLANT

## 2019-07-19 NOTE — Op Note (Signed)
Preoperative diagnosis: bladder neck contracture  Postoperative diagnosis: Same  Procedure: 1. Cystoscopy 2. Incision of bladder neck 3. Injection of mitomycin C into bladde rneck  Attending: Nicolette Bang  Anesthesia: General  History of blood loss: Minimal  Antibiotics: Ancef  Specimens: none  Drains: 20 french silicone catheter  Findings: severe bladder neck contracture.  No masses/lesions in the bladder. 5cc of 1mg /cc mitomycin C injected at 8 location at the bladder neck  Indications: Patient is a 84 year old male with a history of difficulty with urination and was found to have a recurrent bladder neck contracture. After discussing treatment options the patient wishes to proceed with incision of bladder neck and mitomycin C injection  Procedure in detail: Prior to procedure consent was obtained.  Patient was brought to the operating room and a brief timeout was done to ensure correct patient, correct procedure, correct site.  General anesthesia was administered and patient was placed in dorsal lithotomy position.  His genitalia was then prepped and draped in usual sterile fashion. We then passed a 15 French cystoscope up to the bladder neck where we encountered a dense stricture. A zipwire was passed into bladder. The cystoscope was removed and we then inserted the urethratome up to the stricture. Using the cold knife at the 12, 5 and 7oclock position we incised the stricture. We then passed the scope into the bladder. We inspected the bladder and no lesions, masses, or calculi were noted. We then used the injection set to inject a total of 5cc of 1mg /cc mitomycin C at 8 locations at the bladder neck We then removed the cystoscope and placed an 20 French silicone catheter.  This then concluded the procedure which was well tolerated by the patient.  Complications: None  Condition: Stable, extubated, transferred to PACU.  Plan: Patient is to be discharged home.  He is to follow  up in 1 week for voiding trial.

## 2019-07-19 NOTE — Anesthesia Preprocedure Evaluation (Addendum)
Anesthesia Evaluation  Patient identified by MRN, date of birth, ID band Patient awake    Reviewed: Allergy & Precautions, NPO status , Patient's Chart, lab work & pertinent test results  History of Anesthesia Complications (+) PONV and history of anesthetic complications  Airway Mallampati: II  TM Distance: >3 FB Neck ROM: Full    Dental  (+) Lower Dentures, Upper Dentures   Pulmonary COPD, former smoker,    Pulmonary exam normal breath sounds clear to auscultation       Cardiovascular hypertension, Normal cardiovascular exam Rhythm:Regular Rate:Normal  16-Jul-2019 12:03:40 [facility]-[id number] ROUTINE RECORD Sinus rhythm with 1st degree A-V block Septal infarct , age undetermined Abnormal ECG   Neuro/Psych PSYCHIATRIC DISORDERS Anxiety Depression    GI/Hepatic GERD  Medicated,  Endo/Other  Hypothyroidism   Renal/GU      Musculoskeletal  (+) Arthritis ,   Abdominal   Peds  Hematology   Anesthesia Other Findings   Reproductive/Obstetrics                            Anesthesia Physical Anesthesia Plan  ASA: II  Anesthesia Plan: General   Post-op Pain Management:    Induction: Intravenous  PONV Risk Score and Plan: Ondansetron and Dexamethasone  Airway Management Planned: LMA  Additional Equipment:   Intra-op Plan:   Post-operative Plan: Extubation in OR  Informed Consent: I have reviewed the patients History and Physical, chart, labs and discussed the procedure including the risks, benefits and alternatives for the proposed anesthesia with the patient or authorized representative who has indicated his/her understanding and acceptance.     Dental advisory given  Plan Discussed with: CRNA  Anesthesia Plan Comments:        Anesthesia Quick Evaluation

## 2019-07-19 NOTE — Discharge Instructions (Signed)
Indwelling Urinary Catheter Care, Adult °An indwelling urinary catheter is a thin tube that is put into your bladder. The tube helps to drain pee (urine) out of your body. The tube goes in through your urethra. Your urethra is where pee comes out of your body. Your pee will come out through the catheter, then it will go into a bag (drainage bag). °Take good care of your catheter so it will work well. °How to wear your catheter and bag °Supplies needed °· Sticky tape (adhesive tape) or a leg strap. °· Alcohol wipe or soap and water (if you use tape). °· A clean towel (if you use tape). °· Large overnight bag. °· Smaller bag (leg bag). °Wearing your catheter °Attach your catheter to your leg with tape or a leg strap. °· Make sure the catheter is not pulled tight. °· If a leg strap gets wet, take it off and put on a dry strap. °· If you use tape to hold the bag on your leg: °1. Use an alcohol wipe or soap and water to wash your skin where the tape made it sticky before. °2. Use a clean towel to pat-dry that skin. °3. Use new tape to make the bag stay on your leg. °Wearing your bags °You should have been given a large overnight bag. °· You may wear the overnight bag in the day or night. °· Always have the overnight bag lower than your bladder.  Do not let the bag touch the floor. °· Before you go to sleep, put a clean plastic bag in a wastebasket. Then hang the overnight bag inside the wastebasket. °You should also have a smaller leg bag that fits under your clothes. °· Always wear the leg bag below your knee. °· Do not wear your leg bag at night. °How to care for your skin and catheter °Supplies needed °· A clean washcloth. °· Water and mild soap. °· A clean towel. °Caring for your skin and catheter ° °  ° °· Clean the skin around your catheter every day: °? Wash your hands with soap and water. °? Wet a clean washcloth in warm water and mild soap. °? Clean the skin around your urethra. °? If you are male: °? Gently  spread the folds of skin around your vagina (labia). °? With the washcloth in your other hand, wipe the inner side of your labia on each side. Wipe from front to back. °? If you are male: °? Pull back any skin that covers the end of your penis (foreskin). °? With the washcloth in your other hand, wipe your penis in small circles. Start wiping at the tip of your penis, then move away from the catheter. °? Move the foreskin back in place, if needed. °? With your free hand, hold the catheter close to where it goes into your body. °? Keep holding the catheter during cleaning so it does not get pulled out. °? With the washcloth in your other hand, clean the catheter. °? Only wipe downward on the catheter. °? Do not wipe upward toward your body. Doing this may push germs into your urethra and cause infection. °? Use a clean towel to pat-dry the catheter and the skin around it. Make sure to wipe off all soap. °? Wash your hands with soap and water. °· Shower every day. Do not take baths. °· Do not use cream, ointment, or lotion on the area where the catheter goes into your body, unless your doctor tells you   to. °· Do not use powders, sprays, or lotions on your genital area. °· Check your skin around the catheter every day for signs of infection. Check for: °? Redness, swelling, or pain. °? Fluid or blood. °? Warmth. °? Pus or a bad smell. °How to empty the bag °Supplies needed °· Rubbing alcohol. °· Gauze pad or cotton ball. °· Tape or a leg strap. °Emptying the bag °Pour the pee out of your bag when it is ?-½ full, or at least 2-3 times a day. Do this for your overnight bag and your leg bag. °1. Wash your hands with soap and water. °2. Separate (detach) the bag from your leg. °3. Hold the bag over the toilet or a clean pail. Keep the bag lower than your hips and bladder. This is so the pee (urine) does not go back into the tube. °4. Open the pour spout. It is at the bottom of the bag. °5. Empty the pee into the toilet or  pail. Do not let the pour spout touch any surface. °6. Put rubbing alcohol on a gauze pad or cotton ball. °7. Use the gauze pad or cotton ball to clean the pour spout. °8. Close the pour spout. °9. Attach the bag to your leg with tape or a leg strap. °10. Wash your hands with soap and water. °Follow instructions for cleaning the drainage bag: °· From the product maker. °· As told by your doctor. °How to change the bag °Supplies needed °· Alcohol wipes. °· A clean bag. °· Tape or a leg strap. °Changing the bag °Replace your bag when it starts to leak, smell bad, or look dirty. °1. Wash your hands with soap and water. °2. Separate the dirty bag from your leg. °3. Pinch the catheter with your fingers so that pee does not spill out. °4. Separate the catheter tube from the bag tube where these tubes connect (at the connection valve). Do not let the tubes touch any surface. °5. Clean the end of the catheter tube with an alcohol wipe. Use a different alcohol wipe to clean the end of the bag tube. °6. Connect the catheter tube to the tube of the clean bag. °7. Attach the clean bag to your leg with tape or a leg strap. Do not make the bag tight on your leg. °8. Wash your hands with soap and water. °General rules ° °· Never pull on your catheter. Never try to take it out. Doing that can hurt you. °· Always wash your hands before and after you touch your catheter or bag. Use a mild, fragrance-free soap. If you do not have soap and water, use hand sanitizer. °· Always make sure there are no twists or bends (kinks) in the catheter tube. °· Always make sure there are no leaks in the catheter or bag. °· Drink enough fluid to keep your pee pale yellow. °· Do not take baths, swim, or use a hot tub. °· If you are male, wipe from front to back after you poop (have a bowel movement). °Contact a doctor if: °· Your pee is cloudy. °· Your pee smells worse than usual. °· Your catheter gets clogged. °· Your catheter leaks. °· Your bladder  feels full. °Get help right away if: °· You have redness, swelling, or pain where the catheter goes into your body. °· You have fluid, blood, pus, or a bad smell coming from the area where the catheter goes into your body. °· Your skin feels warm where   the catheter goes into your body.  You have a fever.  You have pain in your: ? Belly (abdomen). ? Legs. ? Lower back. ? Bladder.  You see blood in the catheter.  Your pee is pink or red.  You feel sick to your stomach (nauseous).  You throw up (vomit).  You have chills.  Your pee is not draining into the bag.  Your catheter gets pulled out. Summary  An indwelling urinary catheter is a thin tube that is placed into the bladder to help drain pee (urine) out of the body.  The catheter is placed into the part of the body that drains pee from the bladder (urethra).  Taking good care of your catheter will keep it working properly and help prevent problems.  Always wash your hands before and after touching your catheter or bag.  Never pull on your catheter or try to take it out. This information is not intended to replace advice given to you by your health care provider. Make sure you discuss any questions you have with your health care provider. Document Released: 01/25/2013 Document Revised: 01/22/2019 Document Reviewed: 05/16/2017 Elsevier Patient Education  2020 [place] After These instructions provide you with information about caring for yourself after your procedure. Your health care provider may also give you more specific instructions. Your treatment has been planned according to current medical practices, but problems sometimes occur. Call your health care provider if you have any problems or questions after your procedure. What can I expect after the procedure? After your procedure, you may:  Feel sleepy for several hours.  Feel clumsy and have poor balance for several  hours.  Feel forgetful about what happened after the procedure.  Have poor judgment for several hours.  Feel nauseous or vomit.  Have a sore throat if you had a breathing tube during the procedure. Follow these instructions at home: For at least 24 hours after the procedure:      Have a responsible adult stay with you. It is important to have someone help care for you until you are awake and alert.  Rest as needed.  Do not: ? Participate in activities in which you could fall or become injured. ? Drive. ? Use heavy machinery. ? Drink alcohol. ? Take sleeping pills or medicines that cause drowsiness. ? Make important decisions or sign legal documents. ? Take care of children on your own. Eating and drinking  Follow the diet that is recommended by your health care provider.  If you vomit, drink water, juice, or soup when you can drink without vomiting.  Make sure you have little or no nausea before eating solid foods. General instructions  Take over-the-counter and prescription medicines only as told by your health care provider.  If you have sleep apnea, surgery and certain medicines can increase your risk for breathing problems. Follow instructions from your health care provider about wearing your sleep device: ? Anytime you are sleeping, including during daytime naps. ? While taking prescription pain medicines, sleeping medicines, or medicines that make you drowsy.  If you smoke, do not smoke without supervision.  Keep all follow-up visits as told by your health care provider. This is important. Contact a health care provider if:  You keep feeling nauseous or you keep vomiting.  You feel light-headed.  You develop a rash.  You have a fever. Get help right away if:  You have trouble breathing. Summary  For several hours after your procedure, you may feel sleepy and have poor judgment.  Have a responsible adult stay with you for at least 24 hours or until  you are awake and alert. This information is not intended to replace advice given to you by your health care provider. Make sure you discuss any questions you have with your health care provider. Document Released: 01/21/2016 Document Revised: 12/29/2017 Document Reviewed: 01/21/2016 Elsevier Patient Education  2020 [employer].

## 2019-07-19 NOTE — Anesthesia Procedure Notes (Signed)
Procedure Name: LMA Insertion Date/Time: 07/19/2019 10:01 AM Performed by: Adams, Amy A, CRNA Pre-anesthesia Checklist: Patient identified, Emergency Drugs available, Suction available, Patient being monitored and Timeout performed Patient Re-evaluated:Patient Re-evaluated prior to induction Oxygen Delivery Method: Circle system utilized Preoxygenation: Pre-oxygenation with 100% oxygen Induction Type: IV induction LMA: LMA inserted LMA Size: 5.0 Number of attempts: 1 Placement Confirmation: positive ETCO2 and breath sounds checked- equal and bilateral Tube secured with: Tape Dental Injury: Teeth and Oropharynx as per pre-operative assessment

## 2019-07-19 NOTE — Transfer of Care (Signed)
Immediate Anesthesia Transfer of Care Note  Patient: Christian CASHNER Sr.  Procedure(s) Performed: CYSTOSCOPY WITH INJECTION OF MITOMYCIN (N/A ) TRANSURETHRAL INCISION OF BLADDER NECK (N/A )  Patient Location: PACU  Anesthesia Type:General  Level of Consciousness: awake, alert , oriented and patient cooperative  Airway & Oxygen Therapy: Patient Spontanous Breathing  Post-op Assessment: Report given to RN, Post -op Vital signs reviewed and stable and Patient moving all extremities X 4  Post vital signs: Reviewed and stable  Last Vitals:  Vitals Value Taken Time  BP    Temp    Pulse    Resp    SpO2      Last Pain:  Vitals:   07/19/19 0931  TempSrc: Oral  PainSc: 0-No pain         Complications: No apparent anesthesia complications

## 2019-07-19 NOTE — H&P (Signed)
Urology Admission H&P  Chief Complaint: WEAK URINARY STREAM  History of Present Illness: Christian Miller is a 84yo with a hx of TURP and bladder neck contracture who has a progressively worsening stream. He has moderate to severe LUTS. He has had multiple UTIs in the past year. He has failed dilation previously. No recent hematuria  Past Medical History:  Diagnosis Date  . Anxiety   . Arthritis   . BPH (benign prostatic hyperplasia)   . COPD (chronic obstructive pulmonary disease) ([place])   . Depression   . GERD (gastroesophageal reflux disease)   . HOH (hard of hearing)   . Hypertension   . PONV (postoperative nausea and vomiting)    Past Surgical History:  Procedure Laterality Date  . COMPRESSION HIP SCREW     [facility]  . HERNIA REPAIR     [facility]  . PARTIAL HIP ARTHROPLASTY     [facility]  . REVISION TOTAL HIP ARTHROPLASTY     [facility]  . TRANSURETHRAL RESECTION OF PROSTATE  07/09/2011   Procedure: TRANSURETHRAL RESECTION OF THE PROSTATE (TURP);  Surgeon: Marissa Nestle;  Location: [facility];  Service: Urology;  Laterality: N/A;    Home Medications:  Current Facility-Administered Medications  Medication Dose Route Frequency Provider Last Rate Last Dose  . ceFAZolin (ANCEF) IVPB 2g/100 mL premix  2 g Intravenous 30 min Pre-Op Tobin Witucki, Candee Furbish, MD      . mitoMYcin [facility]) chemo injection 5 mg  5 mg Bladder Instillation Once Verneda Hollopeter, Candee Furbish, MD       Facility-Administered Medications Ordered in Other Encounters  Medication Dose Route Frequency Provider Last Rate Last Dose  . mitoMYcin (MUTAMYCIN) chemo injection 5 mg  5 mg Bladder Instillation Once Alyson Ingles Candee Furbish, MD       Allergies:  Allergies  Allergen Reactions  . Pneumococcal Vaccines Rash    Family History  Problem Relation Age of Onset  . Anesthesia problems Neg Hx   . Hypotension Neg Hx   . Malignant hyperthermia Neg Hx   . Pseudochol deficiency Neg Hx    Social History:  reports that he quit  smoking about 28 years ago. His smoking use included cigarettes. He has a 12.50 pack-year smoking history. He has never used smokeless tobacco. He reports that he does not drink alcohol or use drugs.  Review of Systems  Genitourinary: Positive for frequency and urgency.  All other systems reviewed and are negative.   Physical Exam:  Vital signs in last 24 hours:   Physical Exam  Constitutional: He is oriented to person, place, and time. He appears well-developed and well-nourished.  HENT:  Head: Normocephalic and atraumatic.  Eyes: Pupils are equal, round, and reactive to light. EOM are normal.  Neck: Normal range of motion. No thyromegaly present.  Cardiovascular: Normal rate and regular rhythm.  Respiratory: Effort normal. No respiratory distress.  GI: Soft. He exhibits no distension.  Musculoskeletal: Normal range of motion.        General: No edema.  Neurological: He is alert and oriented to person, place, and time.  Skin: Skin is warm and dry.  Psychiatric: He has a normal mood and affect. His behavior is normal. Judgment and thought content normal.    Laboratory Data:  No results found for this or any previous visit (from the past 24 hour(s)). Recent Results (from the past 240 hour(s))  SARS CORONAVIRUS 2 (TAT 6-24 HRS) Nasopharyngeal Nasopharyngeal Swab     Status: None   Collection Time: 07/16/19  7:20 AM   Specimen: Nasopharyngeal Swab  Result Value Ref Range Status   SARS Coronavirus 2 NEGATIVE NEGATIVE Final    Comment: (NOTE) SARS-CoV-2 target nucleic acids are NOT DETECTED. The SARS-CoV-2 RNA is generally detectable in upper and lower respiratory specimens during the acute phase of infection. Negative results do not preclude SARS-CoV-2 infection, do not rule out co-infections with other pathogens, and should not be used as the sole basis for treatment or other patient management decisions. Negative results must be combined with clinical observations, patient  history, and epidemiological information. The expected result is Negative. Fact Sheet for Patients: SugarRoll.be Fact Sheet for Healthcare Providers: https://www.woods-mathews.com/ This test is not yet approved or cleared by the [place] FDA and  has been authorized for detection and/or diagnosis of SARS-CoV-2 by FDA under an Emergency Use Authorization (EUA). This EUA will remain  in effect (meaning this test can be used) for the duration of the COVID-19 declaration under Section 56 4(b)(1) of the Act, 21 U.S.C. section 360bbb-3(b)(1), unless the authorization is terminated or revoked sooner. Performed at [facility], [place] [address] [place], [place] [address]    Creatinine: Recent Labs    07/16/19 1158  CREATININE 0.94   Baseline Creatinine: 0.9  Impression/Assessment:  84yo with bladder neck contracture  Plan:  The risks/benefits/alternatives to bladder neck incision and mitomycin C injection was explained to the patient and he understands and wishes to proceed with surgery  Nicolette Bang 07/19/2019, 9:23 AM

## 2019-07-19 NOTE — Anesthesia Postprocedure Evaluation (Signed)
Anesthesia Post Note  Patient: DIRRICK LAX Sr.  Procedure(s) Performed: CYSTOSCOPY WITH INJECTION OF MITOMYCIN (N/A ) TRANSURETHRAL INCISION OF BLADDER NECK (N/A )  Patient location during evaluation: PACU Anesthesia Type: General Level of consciousness: awake and alert, patient cooperative and oriented Pain management: pain level controlled Vital Signs Assessment: post-procedure vital signs reviewed and stable Respiratory status: respiratory function stable, spontaneous breathing and nonlabored ventilation Postop Assessment: no apparent nausea or vomiting Anesthetic complications: no     Last Vitals:  Vitals:   07/19/19 0931 07/19/19 1034  BP: (!) 160/71 (P) 137/73  Pulse: 60 (P) 65  Resp: 19 (P) 12  Temp: 36.4 C (P) 36.6 C  SpO2: 95% (P) 94%    Last Pain:  Vitals:   07/19/19 0931  TempSrc: Oral  PainSc: 0-No pain                 Liandra Mendia

## 2019-07-20 ENCOUNTER — Encounter (HOSPITAL_COMMUNITY): Payer: Self-pay | Admitting: Urology

## 2019-07-25 ENCOUNTER — Telehealth: Payer: Self-pay | Admitting: Student in an Organized Health Care Education/Training Program

## 2019-07-25 ENCOUNTER — Inpatient Hospital Stay (HOSPITAL_COMMUNITY)
Admission: EM | Admit: 2019-07-25 | Discharge: 2019-07-28 | DRG: 698 | Disposition: A | Discharge status: Home / Self Care | Payer: PPO | Attending: Internal Medicine | Admitting: Internal Medicine

## 2019-07-25 ENCOUNTER — Other Ambulatory Visit: Payer: Self-pay

## 2019-07-25 ENCOUNTER — Encounter (HOSPITAL_COMMUNITY): Payer: Self-pay | Admitting: Emergency Medicine

## 2019-07-25 DIAGNOSIS — E871 Hypo-osmolality and hyponatremia: Secondary | ICD-10-CM | POA: Diagnosis present

## 2019-07-25 DIAGNOSIS — N3281 Overactive bladder: Secondary | ICD-10-CM | POA: Diagnosis not present

## 2019-07-25 DIAGNOSIS — N400 Enlarged prostate without lower urinary tract symptoms: Secondary | ICD-10-CM | POA: Diagnosis present

## 2019-07-25 DIAGNOSIS — N320 Bladder-neck obstruction: Secondary | ICD-10-CM | POA: Diagnosis not present

## 2019-07-25 DIAGNOSIS — Z87891 Personal history of nicotine dependence: Secondary | ICD-10-CM

## 2019-07-25 DIAGNOSIS — K219 Gastro-esophageal reflux disease without esophagitis: Secondary | ICD-10-CM | POA: Diagnosis present

## 2019-07-25 DIAGNOSIS — T83511A Infection and inflammatory reaction due to indwelling urethral catheter, initial encounter: Secondary | ICD-10-CM | POA: Diagnosis not present

## 2019-07-25 DIAGNOSIS — A4152 Sepsis due to Pseudomonas: Secondary | ICD-10-CM | POA: Diagnosis not present

## 2019-07-25 DIAGNOSIS — M1990 Unspecified osteoarthritis, unspecified site: Secondary | ICD-10-CM | POA: Diagnosis present

## 2019-07-25 DIAGNOSIS — N3091 Cystitis, unspecified with hematuria: Secondary | ICD-10-CM | POA: Diagnosis not present

## 2019-07-25 DIAGNOSIS — Z7982 Long term (current) use of aspirin: Secondary | ICD-10-CM

## 2019-07-25 DIAGNOSIS — H9190 Unspecified hearing loss, unspecified ear: Secondary | ICD-10-CM | POA: Diagnosis present

## 2019-07-25 DIAGNOSIS — Z79890 Hormone replacement therapy (postmenopausal): Secondary | ICD-10-CM

## 2019-07-25 DIAGNOSIS — A4150 Gram-negative sepsis, unspecified: Secondary | ICD-10-CM | POA: Diagnosis not present

## 2019-07-25 DIAGNOSIS — I10 Essential (primary) hypertension: Secondary | ICD-10-CM | POA: Diagnosis not present

## 2019-07-25 DIAGNOSIS — Z20828 Contact with and (suspected) exposure to other viral communicable diseases: Secondary | ICD-10-CM | POA: Diagnosis present

## 2019-07-25 DIAGNOSIS — N390 Urinary tract infection, site not specified: Secondary | ICD-10-CM

## 2019-07-25 DIAGNOSIS — E872 Acidosis: Secondary | ICD-10-CM | POA: Diagnosis not present

## 2019-07-25 DIAGNOSIS — F419 Anxiety disorder, unspecified: Secondary | ICD-10-CM | POA: Diagnosis present

## 2019-07-25 DIAGNOSIS — N401 Enlarged prostate with lower urinary tract symptoms: Secondary | ICD-10-CM | POA: Diagnosis not present

## 2019-07-25 DIAGNOSIS — A419 Sepsis, unspecified organism: Secondary | ICD-10-CM | POA: Diagnosis present

## 2019-07-25 DIAGNOSIS — J449 Chronic obstructive pulmonary disease, unspecified: Secondary | ICD-10-CM | POA: Diagnosis not present

## 2019-07-25 DIAGNOSIS — R509 Fever, unspecified: Secondary | ICD-10-CM | POA: Diagnosis present

## 2019-07-25 DIAGNOSIS — E039 Hypothyroidism, unspecified: Secondary | ICD-10-CM | POA: Diagnosis present

## 2019-07-25 DIAGNOSIS — Y846 Urinary catheterization as the cause of abnormal reaction of the patient, or of later complication, without mention of misadventure at the time of the procedure: Secondary | ICD-10-CM | POA: Diagnosis present

## 2019-07-25 DIAGNOSIS — F0391 Unspecified dementia with behavioral disturbance: Secondary | ICD-10-CM | POA: Diagnosis present

## 2019-07-25 DIAGNOSIS — Z887 Allergy status to serum and vaccine status: Secondary | ICD-10-CM | POA: Diagnosis not present

## 2019-07-25 DIAGNOSIS — Z96642 Presence of left artificial hip joint: Secondary | ICD-10-CM | POA: Diagnosis present

## 2019-07-25 DIAGNOSIS — T83511D Infection and inflammatory reaction due to indwelling urethral catheter, subsequent encounter: Secondary | ICD-10-CM | POA: Diagnosis not present

## 2019-07-25 LAB — CBC WITH DIFFERENTIAL/PLATELET
Abs Immature Granulocytes: 0.14 10*3/uL — ABNORMAL HIGH (ref 0.00–0.07)
Basophils Absolute: 0.1 10*3/uL (ref 0.0–0.1)
Basophils Relative: 0 %
Eosinophils Absolute: 0.8 10*3/uL — ABNORMAL HIGH (ref 0.0–0.5)
Eosinophils Relative: 4 %
HCT: 36.3 % — ABNORMAL LOW (ref 39.0–52.0)
Hemoglobin: 11.3 g/dL — ABNORMAL LOW (ref 13.0–17.0)
Immature Granulocytes: 1 %
Lymphocytes Relative: 6 %
Lymphs Abs: 1.3 10*3/uL (ref 0.7–4.0)
MCH: 27.8 pg (ref 26.0–34.0)
MCHC: 31.1 g/dL (ref 30.0–36.0)
MCV: 89.2 fL (ref 80.0–100.0)
Monocytes Absolute: 1.9 10*3/uL — ABNORMAL HIGH (ref 0.1–1.0)
Monocytes Relative: 9 %
Neutro Abs: 17.5 10*3/uL — ABNORMAL HIGH (ref 1.7–7.7)
Neutrophils Relative %: 80 %
Platelets: 347 10*3/uL (ref 150–400)
RBC: 4.07 MIL/uL — ABNORMAL LOW (ref 4.22–5.81)
RDW: 14.1 % (ref 11.5–15.5)
WBC: 21.7 10*3/uL — ABNORMAL HIGH (ref 4.0–10.5)
nRBC: 0 % (ref 0.0–0.2)

## 2019-07-25 LAB — COMPREHENSIVE METABOLIC PANEL
ALT: 11 U/L (ref 0–44)
AST: 16 U/L (ref 15–41)
Albumin: 3.7 g/dL (ref 3.5–5.0)
Alkaline Phosphatase: 89 U/L (ref 38–126)
Anion gap: 8 (ref 5–15)
BUN: 21 mg/dL (ref 8–23)
CO2: 23 mmol/L (ref 22–32)
Calcium: 8.9 mg/dL (ref 8.9–10.3)
Chloride: 101 mmol/L (ref 98–111)
Creatinine, Ser: 1.12 mg/dL (ref 0.61–1.24)
GFR calc Af Amer: >60 mL/min (ref >60)
GFR calc non Af Amer: >60 mL/min (ref >60)
Glucose, Bld: 123 mg/dL — ABNORMAL HIGH (ref 70–99)
Potassium: 4.8 mmol/L (ref 3.5–5.1)
Sodium: 132 mmol/L — ABNORMAL LOW (ref 135–145)
Total Bilirubin: 0.3 mg/dL (ref 0.3–1.2)
Total Protein: 7.5 g/dL (ref 6.5–8.1)

## 2019-07-25 LAB — URINALYSIS, ROUTINE W REFLEX MICROSCOPIC
Bacteria, UA: NONE SEEN
Bilirubin Urine: NEGATIVE
Glucose, UA: NEGATIVE mg/dL
Ketones, ur: NEGATIVE mg/dL
Nitrite: POSITIVE — AB
Protein, ur: 100 mg/dL — AB
RBC / HPF: >50 RBC/hpf — ABNORMAL HIGH (ref 0–5)
Specific Gravity, Urine: 1.013 (ref 1.005–1.030)
WBC, UA: >50 WBC/hpf — ABNORMAL HIGH (ref 0–5)
pH: 8 (ref 5.0–8.0)

## 2019-07-25 LAB — LACTIC ACID, PLASMA
Lactic Acid, Venous: 1.6 mmol/L (ref 0.5–1.9)
Lactic Acid, Venous: 1.3 mmol/L (ref 0.5–1.9)

## 2019-07-25 LAB — SARS CORONAVIRUS 2 BY RT PCR (HOSPITAL ORDER, PERFORMED IN ~~LOC~~ HOSPITAL LAB): SARS Coronavirus 2: NEGATIVE

## 2019-07-25 MED ORDER — SERTRALINE HCL 50 MG PO TABS
25.0000 mg | ORAL_TABLET | Freq: Every day | ORAL | Status: DC
  Administered 2019-07-25 – 2019-07-27 (×3): 25 mg via ORAL
  Filled 2019-07-25 (×3): qty 1

## 2019-07-25 MED ORDER — SODIUM CHLORIDE 0.9 % IV SOLN
2.0000 g | Freq: Two times a day (BID) | INTRAVENOUS | Status: DC
  Administered 2019-07-25 – 2019-07-28 (×6): 2 g via INTRAVENOUS
  Filled 2019-07-25 (×7): qty 2

## 2019-07-25 MED ORDER — HYDROCODONE-ACETAMINOPHEN 5-325 MG PO TABS
1.0000 | ORAL_TABLET | ORAL | Status: DC | PRN
  Administered 2019-07-26: 1 via ORAL
  Filled 2019-07-25: qty 1

## 2019-07-25 MED ORDER — DARIFENACIN HYDROBROMIDE ER 7.5 MG PO TB24
7.5000 mg | ORAL_TABLET | Freq: Every day | ORAL | Status: DC
  Administered 2019-07-26 – 2019-07-28 (×3): 7.5 mg via ORAL
  Filled 2019-07-25 (×4): qty 1

## 2019-07-25 MED ORDER — AMLODIPINE BESYLATE 5 MG PO TABS
5.0000 mg | ORAL_TABLET | Freq: Every day | ORAL | Status: DC
  Administered 2019-07-26 – 2019-07-28 (×3): 5 mg via ORAL
  Filled 2019-07-25 (×4): qty 1

## 2019-07-25 MED ORDER — POLYETHYLENE GLYCOL 3350 17 GM/SCOOP PO POWD
17.0000 g | Freq: Every day | ORAL | Status: DC
  Filled 2019-07-25: qty 255

## 2019-07-25 MED ORDER — MEMANTINE HCL 10 MG PO TABS
5.0000 mg | ORAL_TABLET | Freq: Two times a day (BID) | ORAL | Status: DC
  Administered 2019-07-25 – 2019-07-28 (×6): 5 mg via ORAL
  Filled 2019-07-25 (×7): qty 1

## 2019-07-25 MED ORDER — HYDROXYZINE HCL 10 MG PO TABS
10.0000 mg | ORAL_TABLET | Freq: Every day | ORAL | Status: DC | PRN
  Administered 2019-07-27: 10 mg via ORAL
  Filled 2019-07-25: qty 1

## 2019-07-25 MED ORDER — FOLIC ACID 1 MG PO TABS
1.0000 mg | ORAL_TABLET | Freq: Every day | ORAL | Status: DC
  Administered 2019-07-25 – 2019-07-27 (×3): 1 mg via ORAL
  Filled 2019-07-25 (×6): qty 1

## 2019-07-25 MED ORDER — ACETAMINOPHEN 650 MG RE SUPP: 650.0000 mg | Freq: Four times a day (QID) | RECTAL | Status: DC | PRN

## 2019-07-25 MED ORDER — DIVALPROEX SODIUM 125 MG PO DR TAB
125.0000 mg | DELAYED_RELEASE_TABLET | Freq: Every day | ORAL | Status: DC
  Administered 2019-07-25 – 2019-07-27 (×3): 125 mg via ORAL
  Filled 2019-07-25 (×5): qty 1

## 2019-07-25 MED ORDER — VANCOMYCIN HCL IN DEXTROSE 750-5 MG/150ML-% IV SOLN
750.0000 mg | Freq: Two times a day (BID) | INTRAVENOUS | Status: DC
  Administered 2019-07-26 – 2019-07-27 (×3): 750 mg via INTRAVENOUS
  Filled 2019-07-25 (×4): qty 150

## 2019-07-25 MED ORDER — ALFUZOSIN HCL ER 10 MG PO TB24
10.0000 mg | ORAL_TABLET | Freq: Every day | ORAL | Status: DC
  Administered 2019-07-25 – 2019-07-27 (×3): 10 mg via ORAL
  Filled 2019-07-25 (×5): qty 1

## 2019-07-25 MED ORDER — LEVOTHYROXINE SODIUM 137 MCG PO TABS
137.0000 ug | ORAL_TABLET | Freq: Every day | ORAL | Status: DC
  Administered 2019-07-26 – 2019-07-28 (×3): 137 ug via ORAL
  Filled 2019-07-25 (×3): qty 1

## 2019-07-25 MED ORDER — CHLORHEXIDINE GLUCONATE CLOTH 2 % EX PADS
6.0000 | MEDICATED_PAD | Freq: Every day | CUTANEOUS | Status: DC
  Administered 2019-07-26 – 2019-07-27 (×2): 6 via TOPICAL

## 2019-07-25 MED ORDER — SODIUM CHLORIDE 0.9 % IV SOLN
1.0000 g | Freq: Once | INTRAVENOUS | Status: AC
  Administered 2019-07-25: 1 g via INTRAVENOUS
  Filled 2019-07-25: qty 10

## 2019-07-25 MED ORDER — SODIUM CHLORIDE 0.9 % IV SOLN
INTRAVENOUS | Status: DC
  Administered 2019-07-25 – 2019-07-26 (×2): via INTRAVENOUS

## 2019-07-25 MED ORDER — OLANZAPINE 5 MG PO TABS
15.0000 mg | ORAL_TABLET | Freq: Every day | ORAL | Status: DC
  Administered 2019-07-25 – 2019-07-27 (×3): 15 mg via ORAL
  Filled 2019-07-25 (×3): qty 3

## 2019-07-25 MED ORDER — TROSPIUM CHLORIDE ER 60 MG PO CP24: 60.0000 mg | ORAL_CAPSULE | Freq: Every day | ORAL | Status: DC

## 2019-07-25 MED ORDER — SODIUM CHLORIDE 0.9 % IV BOLUS
500.0000 mL | Freq: Once | INTRAVENOUS | Status: AC
  Administered 2019-07-25: 500 mL via INTRAVENOUS

## 2019-07-25 MED ORDER — ACETAMINOPHEN 325 MG PO TABS
650.0000 mg | ORAL_TABLET | Freq: Four times a day (QID) | ORAL | Status: DC | PRN
  Administered 2019-07-25: 650 mg via ORAL
  Filled 2019-07-25: qty 2

## 2019-07-25 MED ORDER — VANCOMYCIN HCL IN DEXTROSE 1-5 GM/200ML-% IV SOLN
1000.0000 mg | INTRAVENOUS | Status: AC
  Administered 2019-07-26: 1000 mg via INTRAVENOUS
  Filled 2019-07-25: qty 200

## 2019-07-25 MED ORDER — GABAPENTIN 300 MG PO CAPS
600.0000 mg | ORAL_CAPSULE | Freq: Two times a day (BID) | ORAL | Status: DC
  Administered 2019-07-25 – 2019-07-28 (×6): 600 mg via ORAL
  Filled 2019-07-25 (×13): qty 2

## 2019-07-25 MED ORDER — PANTOPRAZOLE SODIUM 40 MG PO TBEC
40.0000 mg | DELAYED_RELEASE_TABLET | Freq: Every day | ORAL | Status: DC
  Administered 2019-07-26 – 2019-07-28 (×3): 40 mg via ORAL
  Filled 2019-07-25 (×4): qty 1

## 2019-07-25 MED ORDER — DONEPEZIL HCL 5 MG PO TABS
5.0000 mg | ORAL_TABLET | Freq: Every day | ORAL | Status: DC
  Administered 2019-07-25 – 2019-07-27 (×3): 5 mg via ORAL
  Filled 2019-07-25 (×3): qty 1

## 2019-07-25 NOTE — ED Triage Notes (Signed)
Per wife had bladder surgery on Monday and began running a fever today. Has had some blood in his urine with foley in place. Wife gave naproxen at noon.

## 2019-07-25 NOTE — ED Notes (Signed)
Call to dr winters about ? Need to change indwelling catheter upon admission. Per MD do not change the catheter, he has just seen the pt and they will take it out on Wednesday

## 2019-07-25 NOTE — Telephone Encounter (Signed)
Wife called on call pager as patient has been having hematuria in his catheter and now having low grade fever 100.3 at home. He had recent bladder neck incision surgery for bladder neck contracture on 07/19/2019. She said catheter does continue to drain despite a few blood clots. We discussed that if fever continues to rise, particularly over 101.5, or if catheter is not draining that he should proceed to ED to be evaluated for UTI. They seem anxious and think that they may go ahead and proceed to ED sooner, which is also very reasonable. If he comes to ED, would recommend vitals, basic labs, gentle flushing of catheter and bedside bladder scan to make sure draining well, UA, and formal urine culture. If suspicion for infection, then okay to start empiric antibiotics until urology follow up this week; please ensure urine culture (NOT just UA) sent prior to starting antibiotics.

## 2019-07-25 NOTE — ED Provider Notes (Signed)
[facility] EMERGENCY DEPARTMENT Provider Note   CSN: [id number] Arrival date & time: 07/25/19  1628     History   Chief Complaint Chief Complaint  Patient presents with  . Fever    HPI Christian MEHRA Sr. is a 84 y.o. male.     HPI Patient presents 4 days after urologic procedure, now with concern for fever, rigors, lower abdominal pain, dysuria, hematuria.  The abdominal pain is sore, mild, crampy.  Patient notes that he was recovering well until yesterday, when he noticed mild nausea, shakiness. Over the interval day he has developed the aforementioned concerns He denies chest pain, dyspnea, confusion, disorientation, but with worsening condition he presents for evaluation. Patient does have some difficulty with hearing, but is capable of providing his own history, with additional details provided by his daughter. Past Medical History:  Diagnosis Date  . Anxiety   . Arthritis   . BPH (benign prostatic hyperplasia)   . COPD (chronic obstructive pulmonary disease) ([place])   . Depression   . GERD (gastroesophageal reflux disease)   . HOH (hard of hearing)   . Hypertension   . PONV (postoperative nausea and vomiting)     Patient Active Problem List   Diagnosis Date Noted  . Acute lower UTI 07/25/2019  . Bilateral carotid artery disease ([place]) 12/08/2012  . Near syncope 12/06/2012  . Lightheadedness 12/06/2012  . Bradycardia 12/06/2012  . HTN (hypertension) 12/06/2012  . Hyponatremia 12/06/2012  . URI (upper respiratory infection) 12/06/2012    Past Surgical History:  Procedure Laterality Date  . [facility] HIP SCREW     [facility]  . CYSTOSCOPY WITH INJECTION N/A 07/19/2019   Procedure: CYSTOSCOPY WITH INJECTION OF MITOMYCIN;  Surgeon: Cleon Gustin, MD;  Location: [facility];  Service: Urology;  Laterality: N/A;  . HERNIA REPAIR     [facility]  . [facility]IAL H[facility] ARTHROPLASTY     [facility]  . REVISION TOTAL HIP [facility]THROPLASTY     [facility]  . TRANSURETHRAL INCISION  OF BLADDER NECK N/A 07/19/2019   Procedure: TRANSURETHRAL INCISION OF BLADDER NECK;  Surgeon: Cleon Gustin, MD;  Location: [facility];  Service: Urology;  Laterality: N/A;  . TRANSURETHRAL RESECTION OF PROSTATE  07/09/2011   Procedure: TRANSURETHRAL RESECTION OF THE PROSTATE (TURP);  Surgeon: Marissa Nestle;  Location: [facility];  Service: Urology;  Laterality: N/A;        Home Medications    Prior to Admission medications   Medication Sig Start Date End Date Taking? Authorizing Provider  acetaminophen (TYLENOL) 650 MG CR tablet Take 650 mg by mouth every 8 (eight) hours as needed for pain.    [provider]  alfuzosin (UROXATRAL) 10 MG 24 hr tablet Take 10 mg by mouth at bedtime. 04/21/19   [provider]  amLODipine (NORVASC) 5 MG tablet Take 5 mg by mouth daily. for high blood pressure 02/27/19   [provider]  aspirin EC 81 MG tablet Take 81 mg by mouth every other day. At night.    [provider]  Cholecalciferol (VITAMIN D3) 50 MCG (2000 UT) TABS Take 2,000 Units by mouth daily.    [provider]  divalproex (DEPAKOTE) 125 MG DR tablet Take 125 mg by mouth at bedtime.    [provider]  donepezil (ARICEPT) 5 MG tablet Take 5 mg by mouth at bedtime.    [provider]  Folic Acid (FOLATE PO) Take 1 tablet by mouth at bedtime.    [provider]  gabapentin (NEURONTIN) 600 MG tablet Take 600 mg by mouth 2 (two) times daily.     [provider]  HYDROcodone-acetaminophen (NORCO) 5-325 MG tablet Take 1 tablet by mouth every 4 (four) hours as needed for moderate pain. 07/19/19   McKenzie, Candee Furbish, MD  hydrOXYzine (ATARAX/VISTARIL) 10 MG tablet Take 10 mg by mouth daily as needed. 07/09/19   [provider]  levothyroxine (SYNTHROID) 137 MCG tablet Take 137 mcg by mouth daily before breakfast.     [provider]  memantine (NAMENDA) 5 MG tablet Take 5 mg by mouth 2 (two) times daily.     [provider]  Multiple Vitamins-Minerals (MULTIVITAMINS THER. W/MINERALS) TABS Take 1 tablet by mouth daily.      [provider]  OLANZapine (ZYPREXA) 15 MG tablet Take 15 mg by mouth at bedtime.    [provider]  Omega-3 Fatty Acids (FISH OIL) 1000 MG CAPS Take 1,000 mg by mouth every other day.     [provider]  omeprazole (PRILOSEC) 20 MG capsule Take 20 mg by mouth 2 (two) times daily before a meal.     [provider]  polyethylene glycol powder (GLYCOLAX/MIRALAX) 17 GM/SCOOP powder Take 17 g by mouth daily.    [provider]  sertraline (ZOLOFT) 25 MG tablet Take 25 mg by mouth daily.     [provider]  tamsulosin (FLOMAX) 0.4 MG CAPS capsule Take 0.4 mg by mouth daily. 05/06/19   [provider]  Trospium Chloride 60 MG CP24 Take 60 mg by mouth daily.  04/21/19   [provider]    Family History Family History  Problem Relation Age of Onset  . Anesthesia problems Neg Hx   . Hypotension Neg Hx   . Malignant hyperthermia Neg Hx   . Pseudochol deficiency Neg Hx     Social History Social History   Tobacco Use  . Smoking status: Former Smoker    Packs/day: 0.50    Years: 25.00    Pack years: 12.50    Types: Cigarettes    Quit date: 07/08/1991    Years since quitting: 28.0  . Smokeless tobacco: Never Used  Substance Use Topics  . Alcohol use: No  . Drug use: No     Allergies   Pneumococcal vaccines   Review of Systems Review of Systems  Constitutional:       Per HPI, otherwise negative  HENT:       Per HPI, otherwise negative  Respiratory:       Per HPI, otherwise negative  Cardiovascular:       Per HPI, otherwise negative  Gastrointestinal: Negative for vomiting.  Endocrine:       Negative aside from HPI  Genitourinary:       Neg aside from HPI   Musculoskeletal:       Per HPI, otherwise negative  Skin: Negative.   Neurological: Negative for syncope.     Physical  Exam Updated Vital Signs BP (!) 137/97   Pulse 90   Temp 100.3 F (37.9 C) (Oral)   Resp 16   Ht 5\' 11"  (1.803 m)   Wt 84.8 kg   SpO2 95%   BMI 26.08 kg/m   Physical Exam Vitals signs and nursing note reviewed.  Constitutional:      General: He is not in acute distress.    Appearance: He is well-developed.  HENT:     Head: Normocephalic and atraumatic.  Eyes:  Conjunctiva/sclera: Conjunctivae normal.  Cardiovascular:     Rate and Rhythm: Normal rate and regular rhythm.  Pulmonary:     Effort: Pulmonary effort is normal. No respiratory distress.     Breath sounds: No stridor.  Abdominal:     General: There is no distension.     Tenderness: There is no abdominal tenderness.  Genitourinary:    Comments: Foley catheter draining bright red fluid Skin:    General: Skin is warm and dry.  Neurological:     Mental Status: He is alert and oriented to person, place, and time.      ED Treatments / Results  Labs (all labs ordered are listed, but only abnormal results are displayed) Labs Reviewed  COMPREHENSIVE METABOLIC PANEL - Abnormal; Notable for the following components:      Result Value   Sodium 132 (*)    Glucose, Bld 123 (*)    All other components within normal limits  CBC WITH DIFFERENTIAL/PLATELET - Abnormal; Notable for the following components:   WBC 21.7 (*)    RBC 4.07 (*)    Hemoglobin 11.3 (*)    HCT 36.3 (*)    Neutro Abs 17.5 (*)    Monocytes Absolute 1.9 (*)    Eosinophils Absolute 0.8 (*)    Abs Immature Granulocytes 0.14 (*)    All other components within normal limits  URINALYSIS, ROUTINE W REFLEX MICROSCOPIC - Abnormal; Notable for the following components:   APPearance HAZY (*)    Hgb urine dipstick LARGE (*)    Protein, ur 100 (*)    Nitrite POSITIVE (*)    Leukocytes,Ua LARGE (*)    RBC / HPF >50 (*)    WBC, UA >50 (*)    All other components within normal limits  CULTURE, BLOOD (ROUTINE X 2)  SARS CORONAVIRUS 2 (HOSPITAL ORDER,  [facility])  CULTURE, BLOOD (ROUTINE X 2)  URINE CULTURE  LACTIC ACID, PLASMA  LACTIC ACID, PLASMA    EKG None  Radiology No results found.  Procedures Procedures (including critical care time)  Medications Ordered in ED Medications  cefTRIAXone (ROCEPHIN) 1 g in sodium chloride 0.9 % 100 mL IVPB (has no administration in time range)  sodium chloride 0.9 % bolus 500 mL (500 mLs Intravenous New Bag/Given 07/25/19 1800)     Initial Impression / Assessment and Plan / ED Course  I have reviewed the triage vital signs and the nursing notes.  Pertinent labs & imaging results that were available during my care of the patient were reviewed by me and considered in my medical decision making (see chart for details).    Initial labs notable for leukocytosis, greater than 20,000, but no lactic acidosis, and the patient is not hypotensive.    7:30 PM On repeat exam patient is in similar condition.  I have discussed this case with his urology team and our internal medicine colleagues for admission. Patient has leukocytosis, but no lactic acidosis, but given evidence for urinary tract infection, indwelling catheter and recent urologic procedure, he has started antibiotics, urine culture has been sent and he will require admission for further monitoring, management.  Final Clinical Impressions(s) / ED Diagnoses   Final diagnoses:  Urinary tract infection associated with indwelling urethral catheter, initial encounter [facility])     Carmin Muskrat, MD 07/25/19 1932

## 2019-07-25 NOTE — Consult Note (Signed)
Reason for Consult:Bladder Outlet Obstruction / Bladder Neck Conracture, Severe Urinary Tract Infection  Referring Physician: Carmin Muskrat MD  Christian Cella Sr. is an 84 y.o. male.   HPI:   1 - Bladder Outlet Obstruction / Bladder Neck Contracture - long h/o obstructing and irritative voiding s/p TURP x2 and now transurethral incision of bladder neckc contracture / mito-C injection 07/19/19 by McKenzie at [facility] for recurrent symtpoms in setting of bladder neck contracture. Catheter placed and tentative plan for voiding trial 07/28/19.   2 -  Severe Urinary Tract Infection - fevers to 101, WBC 20k on eval fevers and post-op hematuria 08/05/19. NO hypotension or lactic acidosis. Pre-op UA withtou infectious parameters. UCX 10/11 pending / placed on empiric Rocephin, UA from ER +nitrite. No flank pain.   PMH sig for COPD, Left total hip replacement; Rt inguinal hernia repair. He lives in [place].   Today " Christian Miller " is seen in consultation for significant UTI 5 days after recetn GU surgery. He still had foley in place that is working well. NO blood thinners.  Recent C19 negative. NO additional localizing symptoms.    Past Medical History:  Diagnosis Date  . Anxiety   . Arthritis   . BPH (benign prostatic hyperplasia)   . COPD (chronic obstructive pulmonary disease) ([place])   . Depression   . GERD (gastroesophageal reflux disease)   . HOH (hard of hearing)   . Hypertension   . PONV (postoperative nausea and vomiting)     Past Surgical History:  Procedure Laterality Date  . [facility] SCREW     [facility]  . CYSTOSCOPY WITH INJECTION N/A 07/19/2019   Procedure: CYSTOSCOPY WITH INJECTION OF MITOMYCIN;  Surgeon: Cleon Gustin, MD;  Location: [facility];  Service: Urology;  Laterality: N/A;  . HERNIA REPAIR     [facility]  . [facility] H[facility] ARTHROPLASTY     [facility]  . REVISION TOTAL HIP ARTHROPLASTY     [facility]  . TRANSURETHRAL INCISION OF BLADDER NECK N/A 07/19/2019   Procedure:  TRANSURETHRAL INCISION OF BLADDER NECK;  Surgeon: Cleon Gustin, MD;  Location: [facility];  Service: Urology;  Laterality: N/A;  . TRANSURETHRAL RESECTION OF PROSTATE  07/09/2011   Procedure: TRANSURETHRAL RESECTION OF THE PROSTATE (TURP);  Surgeon: Marissa Nestle;  Location: [facility];  Service: Urology;  Laterality: N/A;    Family History  Problem Relation Age of Onset  . Anesthesia problems Neg Hx   . Hypotension Neg Hx   . Malignant hyperthermia Neg Hx   . Pseudochol deficiency Neg Hx     Social History:  reports that he quit smoking about 28 years ago. His smoking use included cigarettes. He has a 12.50 pack-year smoking history. He has never used smokeless tobacco. He reports that he does not drink alcohol or use drugs.  Allergies:  Allergies  Allergen Reactions  . Pneumococcal Vaccines Rash    Medications: I have reviewed the patient's current medications.  Results for orders placed or performed during the hospital encounter of 07/25/19 (from the past 48 hour(s))  Comprehensive metabolic panel     Status: Abnormal   Collection Time: 07/25/19  5:30 PM  Result Value Ref Range   Sodium 132 (L) 135 - 145 mmol/L   Potassium 4.8 3.5 - 5.1 mmol/L   Chloride 101 98 - 111 mmol/L   CO2 23 22 - 32 mmol/L   Glucose, Bld 123 (H) 70 - 99 mg/dL   BUN 21 8 - 23  mg/dL   Creatinine, Ser 1.12 0.61 - 1.24 mg/dL   Calcium 8.9 8.9 - 10.3 mg/dL   Total Protein 7.5 6.5 - 8.1 g/dL   Albumin 3.7 3.5 - 5.0 g/dL   AST 16 15 - 41 U/L   ALT 11 0 - 44 U/L   Alkaline Phosphatase 89 38 - 126 U/L   Total Bilirubin 0.3 0.3 - 1.2 mg/dL   GFR calc non Af Amer >60 >60 mL/min   GFR calc Af Amer >60 >60 mL/min   Anion gap 8 5 - 15    Comment: Performed at [facility], [address] [place], [place] [address]  CBC with Differential     Status: Abnormal   Collection Time: 07/25/19  5:30 PM  Result Value Ref Range   WBC 21.7 (H) 4.0 - 10.5 K/uL   RBC 4.07 (L) 4.22 - 5.81 MIL/uL   Hemoglobin 11.3  (L) 13.0 - 17.0 g/dL   HCT 36.3 (L) 39.0 - 52.0 %   MCV 89.2 80.0 - 100.0 fL   MCH 27.8 26.0 - 34.0 pg   MCHC 31.1 30.0 - 36.0 g/dL   RDW 14.1 11.5 - 15.5 %   Platelets 347 150 - 400 K/uL   nRBC 0.0 0.0 - 0.2 %   Neutrophils Relative % 80 %   Neutro Abs 17.5 (H) 1.7 - 7.7 K/uL   Lymphocytes Relative 6 %   Lymphs Abs 1.3 0.7 - 4.0 K/uL   Monocytes Relative 9 %   Monocytes Absolute 1.9 (H) 0.1 - 1.0 K/uL   Eosinophils Relative 4 %   Eosinophils Absolute 0.8 (H) 0.0 - 0.5 K/uL   Basophils Relative 0 %   Basophils Absolute 0.1 0.0 - 0.1 K/uL   Immature Granulocytes 1 %   Abs Immature Granulocytes 0.14 (H) 0.00 - 0.07 K/uL    Comment: Performed at [facility], [address], [place], [place] [address]  Lactic acid, plasma     Status: None   Collection Time: 07/25/19  5:30 PM  Result Value Ref Range   Lactic Acid, Venous 1.6 0.5 - 1.9 mmol/L    Comment: Performed at [facility], [address] [place], [place] [address]  Urinalysis, Routine w reflex microscopic     Status: Abnormal   Collection Time: 07/25/19  5:30 PM  Result Value Ref Range   Color, Urine YELLOW YELLOW   APPearance HAZY (A) CLEAR   Specific Gravity, Urine 1.013 1.005 - 1.030   pH 8.0 5.0 - 8.0   Glucose, UA NEGATIVE NEGATIVE mg/dL   Hgb urine dipstick LARGE (A) NEGATIVE   Bilirubin Urine NEGATIVE NEGATIVE   Ketones, ur NEGATIVE NEGATIVE mg/dL   Protein, ur 100 (A) NEGATIVE mg/dL   Nitrite POSITIVE (A) NEGATIVE   Leukocytes,Ua LARGE (A) NEGATIVE   RBC / HPF >50 (H) 0 - 5 RBC/hpf   WBC, UA >50 (H) 0 - 5 WBC/hpf   Bacteria, UA NONE SEEN NONE SEEN   WBC Clumps PRESENT    Mucus PRESENT     Comment: Performed at [facility], [address], [place],  [address]  Culture, blood (routine x 2)     Status: None (Preliminary result)   Collection Time: 07/25/19  5:31 PM   Specimen: BLOOD RIGHT ARM  Result Value Ref Range   Specimen Description BLOOD RIGHT ARM    Special Requests      BOTTLES DRAWN AEROBIC  AND ANAEROBIC Blood Culture adequate volume Performed at [facility], [address] [place], [place] [address]    Culture PENDING    Report Status PENDING   SARS Coronavirus 2 by RT PCR (hospital order, performed in [place] hospital lab)     Status: None   Collection Time: 07/25/19  5:31 PM  Result Value Ref Range   SARS Coronavirus 2 NEGATIVE NEGATIVE    Comment: (NOTE) If result is NEGATIVE SARS-CoV-2 target nucleic acids are NOT DETECTED. The SARS-CoV-2 RNA is generally detectable in upper and lower  respiratory specimens during the acute phase of infection. The lowest  concentration of SARS-CoV-2 viral copies this assay can detect is 250  copies / mL. A negative result does not preclude SARS-CoV-2 infection  and should not be used as the sole basis for treatment or other  patient management decisions.  A negative result may occur with  improper specimen collection / handling, submission of specimen other  than nasopharyngeal swab, presence of viral mutation(s) within the  areas targeted by this assay, and inadequate number of viral copies  (<250 copies / mL). A negative result must be combined with clinical  observations, patient history, and epidemiological information. If result is POSITIVE SARS-CoV-2 target nucleic acids are DETECTED. The SARS-CoV-2 RNA is generally detectable in upper and lower  respiratory specimens dur ing the acute phase of infection.  Positive  results are indicative of active infection with SARS-CoV-2.  Clinical  correlation with patient history and other diagnostic information is  necessary to determine patient infection status.  Positive results do  not rule out bacterial infection or co-infection with other viruses. If result is PRESUMPTIVE POSTIVE SARS-CoV-2 nucleic acids MAY BE PRESENT.   A presumptive positive result was obtained on the submitted specimen  and confirmed on repeat testing.  While 2019 novel coronavirus  (SARS-CoV-2) nucleic  acids may be present in the submitted sample  additional confirmatory testing may be necessary for epidemiological  and / or clinical management purposes  to differentiate between  SARS-CoV-2 and other Sarbecovirus currently known to infect humans.  If clinically indicated additional testing with an alternate test  methodology [phone]) is advised. The SARS-CoV-2 RNA is generally  detectable in upper and lower respiratory sp ecimens during the acute  phase of infection. The expected result is Negative. Fact Sheet for Patients:  StrictlyIdeas.no Fact Sheet for Healthcare Providers: BankingDealers.co.za This test is not yet approved or cleared by the [place] FDA and has been authorized for detection and/or diagnosis of SARS-CoV-2 by FDA under an Emergency Use Authorization (EUA).  This EUA will remain in effect (meaning this test can be used) for the duration of the COVID-19 declaration under Section 564(b)(1) of the Act, 21 U.S.C. section 360bbb-3(b)(1), unless the authorization is terminated or revoked sooner. Performed at [facility], [address], [place], [place] [address]   Lactic acid, plasma     Status: None   Collection Time: 07/25/19  6:13 PM  Result Value Ref Range   Lactic Acid, Venous 1.3 0.5 - 1.9 mmol/L    Comment: Performed at [facility], [address] [place], [place] [address]  Culture, blood (routine x 2)     Status: None (Preliminary result)   Collection Time: 07/25/19  6:13 PM   Specimen: Left Antecubital; Blood  Result Value Ref Range   Specimen Description LEFT ANTECUBITAL    Special Requests      BOTTLES DRAWN AEROBIC AND ANAEROBIC Blood Culture adequate volume Performed at [facility], [address] [place], [place] [address]  Culture PENDING    Report Status PENDING     No results found.  Review of Systems  Constitutional: Positive for chills, fever and malaise/fatigue.  HENT: Negative.    Eyes: Negative.   Respiratory: Negative.  Negative for cough.   Cardiovascular: Negative.  Negative for chest pain.  Gastrointestinal: Negative.   Genitourinary: Positive for hematuria and urgency.  Musculoskeletal: Negative.   Skin: Negative.   Neurological: Negative.   Endo/Heme/Allergies: Negative.   Psychiatric/Behavioral: Negative.    Blood pressure (!) 137/97, pulse 90, temperature 100.3 F (37.9 C), temperature source Oral, resp. rate 16, height 5\' 11"  (1.803 m), weight 84.8 kg, SpO2 95 %. Physical Exam  Constitutional: He appears well-developed.  Very pleasant in ER with family at bedside. AOx3, vigorous for age.   HENT:  Bilateral hearing aids in place.   Eyes: Pupils are equal, round, and reactive to light.  Neck: Normal range of motion.  Cardiovascular: Normal rate.  Respiratory: Effort normal.  GI: Soft.  Genitourinary:    Genitourinary Comments: NO CVAT. Foley in place with light pink urine w/o clots or gross purlulence.    Neurological: He is alert.  Skin: Skin is warm.  Psychiatric: He has a normal mood and affect.    Assessment/Plan:  1 - Bladder Outlet Obstruction / Bladder Neck Contracture - proceed with voiding trial 10/14 in office as long as afebrile at that point, hopefully he will be DC"d by then.   2 -  Severe Urinary Tract Infection - appears complex cystitis / early pyelo. He is fortunatley not in retention. Agree with current empiric rocephin as now recent CX data to guide therapy. Rec goal of afebrile x 24 hours before DC home and hopefully prelim CX data to guide continued outpatient therapy.   Greatly appreciate hospitalist comanagemetn as we are  in [place] office only a few days this week. Please call [facility] on call or Dr. Alyson Ingles with questions anytime.   Alexis Frock 07/25/2019, 8:14 PM

## 2019-07-25 NOTE — ED Notes (Signed)
Blood culture being drawn.

## 2019-07-25 NOTE — H&P (Addendum)
TRH H&P    Patient Demographics:    Christian Miller, is a 84 y.o. male  MRN: [id number]  DOB - 1934/10/25  Admit Date - 07/25/2019  Referring MD/NP/PA:  Carmin Muskrat  Outpatient Primary MD for the patient is [name], MD Nicolette Bang - urology  Patient coming from: home  Chief complaint-  hematuria   HPI:    Christian Miller  is a 84 y.o. male, w Anxiety, Jerrye Bushy, [place], Hypertension, Bph w bladder neck contracture s/p cystoscopy  w injection of mitomycin C into bladder neck 07/19/2019,  apparently presents with c/o flank pain yesterday, and fever, chills, n/v, hematuria today.    In ED,  T 100.3, P 109 R 18, Bp 133/110,  Pox 96% on RA Wt 84.8, kg  Na 132, K 4.8, Bun 21, Creatinine 1.12 Glucose 123 Ast 16, Alt 11 Wbc 21.7, Hgb 11.3, Plt 347 Lactic acid 1.6 Urinalysis >50 rbc, >50 wbc  Blood culture x2 Urine culture pending  Covid negative  ED spoke with urology who will be by to evaluate the patient in am  Pt will be admitted for sepsis secondary to UTI and hematuria.         Review of systems:    In addition to the HPI above,    No Headache, No changes with Vision or hearing, No problems swallowing food or Liquids, No Chest pain, Cough or Shortness of Breath, No Abdominal pain, bowel movements are regular, No Blood in stool   No dysuria, No new skin rashes or bruises, No new joints pains-aches,  No new weakness, tingling, numbness in any extremity, No recent weight gain or loss, No polyuria, polydypsia or polyphagia, No significant Mental Stressors.  All other systems reviewed and are negative.    Past History of the following :    Past Medical History:  Diagnosis Date  . Anxiety   . Arthritis   . BPH (benign prostatic hyperplasia)   . COPD (chronic obstructive pulmonary disease) ([place])   . Depression   . GERD (gastroesophageal reflux disease)   . HOH (hard of  hearing)   . Hypertension   . PONV (postoperative nausea and vomiting)       Past Surgical History:  Procedure Laterality Date  . COMPRESSION HIP SCREW     [facility]  . CYSTOSCOPY WITH INJECTION N/A 07/19/2019   Procedure: CYSTOSCOPY WITH INJECTION OF MITOMYCIN;  Surgeon: Cleon Gustin, MD;  Location: [facility];  Service: Urology;  Laterality: N/A;  . HERNIA REPAIR     [facility]  . PARTIAL HIP ARTHROPLASTY     [facility]  . REVISION TOTAL HIP ARTHROPLASTY     [facility]  . TRANSURETHRAL INCISION OF BLADDER NECK N/A 07/19/2019   Procedure: TRANSURETHRAL INCISION OF BLADDER NECK;  Surgeon: Cleon Gustin, MD;  Location: [facility];  Service: Urology;  Laterality: N/A;  . TRANSURETHRAL RESECTION OF PROSTATE  07/09/2011   Procedure: TRANSURETHRAL RESECTION OF THE PROSTATE (TURP);  Surgeon: Marissa Nestle;  Location: [facility];  Service: Urology;  Laterality: N/A;  Social History:      Social History   Tobacco Use  . Smoking status: Former Smoker    Packs/day: 0.50    Years: 25.00    Pack years: 12.50    Types: Cigarettes    Quit date: 07/08/1991    Years since quitting: 28.0  . Smokeless tobacco: Never Used  Substance Use Topics  . Alcohol use: No       Family History :     Family History  Problem Relation Age of Onset  . Anesthesia problems Neg Hx   . Hypotension Neg Hx   . Malignant hyperthermia Neg Hx   . Pseudochol deficiency Neg Hx        Home Medications:   Prior to Admission medications   Medication Sig Start Date End Date Taking? Authorizing Provider  acetaminophen (TYLENOL) 650 MG CR tablet Take 650 mg by mouth every 8 (eight) hours as needed for pain.    [provider]  alfuzosin (UROXATRAL) 10 MG 24 hr tablet Take 10 mg by mouth at bedtime. 04/21/19   [provider]  amLODipine (NORVASC) 5 MG tablet Take 5 mg by mouth daily. for high blood pressure 02/27/19   [provider]  aspirin EC 81 MG tablet Take 81 mg by mouth  every other day. At night.    [provider]  Cholecalciferol (VITAMIN D3) 50 MCG (2000 UT) TABS Take 2,000 Units by mouth daily.    [provider]  divalproex (DEPAKOTE) 125 MG DR tablet Take 125 mg by mouth at bedtime.    [provider]  donepezil (ARICEPT) 5 MG tablet Take 5 mg by mouth at bedtime.    [provider]  Folic Acid (FOLATE PO) Take 1 tablet by mouth at bedtime.    [provider]  gabapentin (NEURONTIN) 600 MG tablet Take 600 mg by mouth 2 (two) times daily.     [provider]  HYDROcodone-acetaminophen (NORCO) 5-325 MG tablet Take 1 tablet by mouth every 4 (four) hours as needed for moderate pain. 07/19/19   McKenzie, Candee Furbish, MD  hydrOXYzine (ATARAX/VISTARIL) 10 MG tablet Take 10 mg by mouth daily as needed. 07/09/19   [provider]  levothyroxine (SYNTHROID) 137 MCG tablet Take 137 mcg by mouth daily before breakfast.     [provider]  memantine (NAMENDA) 5 MG tablet Take 5 mg by mouth 2 (two) times daily.    [provider]  Multiple Vitamins-Minerals (MULTIVITAMINS THER. W/MINERALS) TABS Take 1 tablet by mouth daily.      [provider]  OLANZapine (ZYPREXA) 15 MG tablet Take 15 mg by mouth at bedtime.    [provider]  Omega-3 Fatty Acids (FISH OIL) 1000 MG CAPS Take 1,000 mg by mouth every other day.     [provider]  omeprazole (PRILOSEC) 20 MG capsule Take 20 mg by mouth 2 (two) times daily before a meal.     [provider]  polyethylene glycol powder (GLYCOLAX/MIRALAX) 17 GM/SCOOP powder Take 17 g by mouth daily.    [provider]  sertraline (ZOLOFT) 25 MG tablet Take 25 mg by mouth daily.     [provider]  tamsulosin (FLOMAX) 0.4 MG CAPS capsule Take 0.4 mg by mouth daily. 05/06/19   [provider]  Trospium Chloride 60 MG CP24 Take 60 mg by mouth daily.  04/21/19   [provider]     Allergies:      Allergies  Allergen Reactions  . Pneumococcal Vaccines Rash     Physical Exam:   Vitals  Blood pressure (!) 137/97, pulse 90, temperature 100.3 F (37.9 C), temperature source Oral, resp. rate 16, height 5\' 11"  (1.803 m), weight 84.8 kg, SpO2 95 %.  1.  General: axoxo3  2. Psychiatric: euthymic  3. Neurologic: cn2-12 intact, reflexes 2+ symmetric, diffuse with no clonus, motor 5/5 in all 4 ext  4. HEENMT:  Anicteric, pupils 1.13mm symmetric, direct consensual intact Neck: no jvd  5. Respiratory : CTAB  6. Cardiovascular : rrr s1, s2, no m/g/r  7. Gastrointestinal:  Abd: soft, nt, nd, +bs  8. Skin:  Ext: no c/c/e, no rash  9.Musculoskeletal:  Good ROM     Data Review:    CBC Recent Labs  Lab 07/25/19 1730  WBC 21.7*  HGB 11.3*  HCT 36.3*  PLT 347  MCV 89.2  MCH 27.8  MCHC 31.1  RDW 14.1  LYMPHSABS 1.3  MONOABS 1.9*  EOSABS 0.8*  BASOSABS 0.1   ------------------------------------------------------------------------------------------------------------------  Results for orders placed or performed during the hospital encounter of 07/25/19 (from the past 48 hour(s))  Comprehensive metabolic panel     Status: Abnormal   Collection Time: 07/25/19  5:30 PM  Result Value Ref Range   Sodium 132 (L) 135 - 145 mmol/L   Potassium 4.8 3.5 - 5.1 mmol/L   Chloride 101 98 - 111 mmol/L   CO2 23 22 - 32 mmol/L   Glucose, Bld 123 (H) 70 - 99 mg/dL   BUN 21 8 - 23 mg/dL   Creatinine, Ser 1.12 0.61 - 1.24 mg/dL   Calcium 8.9 8.9 - 10.3 mg/dL   Total Protein 7.5 6.5 - 8.1 g/dL   Albumin 3.7 3.5 - 5.0 g/dL   AST 16 15 - 41 U/L   ALT 11 0 - 44 U/L   Alkaline Phosphatase 89 38 - 126 U/L   Total Bilirubin 0.3 0.3 - 1.2 mg/dL   GFR calc non Af Amer >60 >60 mL/min   GFR calc Af Amer >60 >60 mL/min   Anion gap 8 5 - 15    Comment: Performed at [facility], [address], [place], [place] [address]  CBC with Differential     Status: Abnormal   Collection  Time: 07/25/19  5:30 PM  Result Value Ref Range   WBC 21.7 (H) 4.0 - 10.5 K/uL   RBC 4.07 (L) 4.22 - 5.81 MIL/uL   Hemoglobin 11.3 (L) 13.0 - 17.0 g/dL   HCT 36.3 (L) 39.0 - 52.0 %   MCV 89.2 80.0 - 100.0 fL   MCH 27.8 26.0 - 34.0 pg   MCHC 31.1 30.0 - 36.0 g/dL   RDW 14.1 11.5 - 15.5 %   Platelets 347 150 - 400 K/uL   nRBC 0.0 0.0 - 0.2 %   Neutrophils Relative % 80 %   Neutro Abs 17.5 (H) 1.7 - 7.7 K/uL   Lymphocytes Relative 6 %   Lymphs Abs 1.3 0.7 - 4.0 K/uL   Monocytes Relative 9 %   Monocytes Absolute 1.9 (H) 0.1 - 1.0 K/uL   Eosinophils Relative 4 %   Eosinophils Absolute 0.8 (H) 0.0 - 0.5 K/uL   Basophils Relative 0 %   Basophils Absolute 0.1 0.0 - 0.1 K/uL   Immature Granulocytes 1 %   Abs Immature Granulocytes 0.14 (H) 0.00 - 0.07 K/uL    Comment: Performed at [facility], [address] [place], [place] [address]  Lactic acid, plasma     Status: None   Collection Time: 07/25/19  5:30 PM  Result Value Ref Range   Lactic Acid, Venous 1.6 0.5 - 1.9 mmol/L    Comment: Performed at [facility], [address], [place], [place] [address]  Urinalysis, Routine w reflex microscopic     Status: Abnormal   Collection Time: 07/25/19  5:30 PM  Result Value Ref Range   Color, Urine YELLOW YELLOW   APPearance HAZY (A) CLEAR   Specific Gravity, Urine 1.013 1.005 - 1.030   pH 8.0 5.0 - 8.0   Glucose, UA NEGATIVE NEGATIVE mg/dL   Hgb urine dipstick LARGE (A) NEGATIVE   Bilirubin Urine NEGATIVE NEGATIVE   Ketones, ur NEGATIVE NEGATIVE mg/dL   Protein, ur 100 (A) NEGATIVE mg/dL   Nitrite POSITIVE (A) NEGATIVE   Leukocytes,Ua LARGE (A) NEGATIVE   RBC / HPF >50 (H) 0 - 5 RBC/hpf   WBC, UA >50 (H) 0 - 5 WBC/hpf   Bacteria, UA NONE SEEN NONE SEEN   WBC Clumps PRESENT    Mucus PRESENT     Comment: Performed at [facility], [address] [place], [place] [address]  Culture, blood (routine x 2)     Status: None (Preliminary result)   Collection Time: 07/25/19  5:31 PM    Specimen: BLOOD RIGHT ARM  Result Value Ref Range   Specimen Description BLOOD RIGHT ARM    Special Requests      BOTTLES DRAWN AEROBIC AND ANAEROBIC Blood Culture adequate volume Performed at [facility], [address], [place], [place] [address]    Culture PENDING    Report Status PENDING   SARS Coronavirus 2 by RT PCR (hospital order, performed in [place] hospital lab)     Status: None   Collection Time: 07/25/19  5:31 PM  Result Value Ref Range   SARS Coronavirus 2 NEGATIVE NEGATIVE    Comment: (NOTE) If result is NEGATIVE SARS-CoV-2 target nucleic acids are NOT DETECTED. The SARS-CoV-2 RNA is generally detectable in upper and lower  respiratory specimens during the acute phase of infection. The lowest  concentration of SARS-CoV-2 viral copies this assay can detect is 250  copies / mL. A negative result does not preclude SARS-CoV-2 infection  and should not be used as the sole basis for treatment or other  patient management decisions.  A negative result may occur with  improper specimen collection / handling, submission of specimen other  than nasopharyngeal swab, presence of viral mutation(s) within the  areas targeted by this assay, and inadequate number of viral copies  (<250 copies / mL). A negative result must be combined with clinical  observations, patient history, and epidemiological information. If result is POSITIVE SARS-CoV-2 target nucleic acids are DETECTED. The SARS-CoV-2 RNA is generally detectable in upper and lower  respiratory specimens dur ing the acute phase of infection.  Positive  results are indicative of active infection with SARS-CoV-2.  Clinical  correlation with patient history and other diagnostic information is  necessary to determine patient infection status.  Positive results do  not rule out bacterial infection or co-infection with other viruses. If result is PRESUMPTIVE POSTIVE SARS-CoV-2 nucleic acids MAY BE PRESENT.   A presumptive  positive result was obtained on the submitted specimen  and confirmed on repeat testing.  While 2019 novel coronavirus  (SARS-CoV-2) nucleic acids may be present in the submitted sample  additional confirmatory testing may be necessary for epidemiological  and / or clinical  management purposes  to differentiate between  SARS-CoV-2 and other Sarbecovirus currently known to infect humans.  If clinically indicated additional testing with an alternate test  methodology [phone]) is advised. The SARS-CoV-2 RNA is generally  detectable in upper and lower respiratory sp ecimens during the acute  phase of infection. The expected result is Negative. Fact Sheet for Patients:  StrictlyIdeas.no Fact Sheet for Healthcare Providers: BankingDealers.co.za This test is not yet approved or cleared by the [place] FDA and has been authorized for detection and/or diagnosis of SARS-CoV-2 by FDA under an Emergency Use Authorization (EUA).  This EUA will remain in effect (meaning this test can be used) for the duration of the COVID-19 declaration under Section 564(b)(1) of the Act, 21 U.S.C. section 360bbb-3(b)(1), unless the authorization is terminated or revoked sooner. Performed at [facility], [address], [place], [place] [address]   Lactic acid, plasma     Status: None   Collection Time: 07/25/19  6:13 PM  Result Value Ref Range   Lactic Acid, Venous 1.3 0.5 - 1.9 mmol/L    Comment: Performed at [facility], [address] [place], [place] [address]  Culture, blood (routine x 2)     Status: None (Preliminary result)   Collection Time: 07/25/19  6:13 PM   Specimen: Left Antecubital; Blood  Result Value Ref Range   Specimen Description LEFT ANTECUBITAL    Special Requests      BOTTLES DRAWN AEROBIC AND ANAEROBIC Blood Culture adequate volume Performed at [facility], [address] [place], [place] [address]    Culture PENDING    Report  Status PENDING     Chemistries  Recent Labs  Lab 07/25/19 1730  NA 132*  K 4.8  CL 101  CO2 23  GLUCOSE 123*  BUN 21  CREATININE 1.12  CALCIUM 8.9  AST 16  ALT 11  ALKPHOS 89  BILITOT 0.3   ------------------------------------------------------------------------------------------------------------------  ------------------------------------------------------------------------------------------------------------------ GFR: Estimated Creatinine Clearance: 55.1 mL/min (by C-G formula based on SCr of 1.12 mg/dL). Liver Function Tests: Recent Labs  Lab 07/25/19 1730  AST 16  ALT 11  ALKPHOS 89  BILITOT 0.3  PROT 7.5  ALBUMIN 3.7   No results for input(s): LIPASE, AMYLASE in the last 168 hours. No results for input(s): AMMONIA in the last 168 hours. Coagulation Profile: No results for input(s): INR, PROTIME in the last 168 hours. Cardiac Enzymes: No results for input(s): CKTOTAL, CKMB, CKMBINDEX, TROPONINI in the last 168 hours. BNP (last 3 results) No results for input(s): PROBNP in the last 8760 hours. HbA1C: No results for input(s): HGBA1C in the last 72 hours. CBG: No results for input(s): GLUCAP in the last 168 hours. Lipid Profile: No results for input(s): CHOL, HDL, LDLCALC, TRIG, CHOLHDL, LDLDIRECT in the last 72 hours. Thyroid Function Tests: No results for input(s): TSH, T4TOTAL, FREET4, T3FREE, THYROIDAB in the last 72 hours. Anemia Panel: No results for input(s): VITAMINB12, FOLATE, FERRITIN, TIBC, IRON, RETICCTPCT in the last 72 hours.  --------------------------------------------------------------------------------------------------------------- Urine analysis:    Component Value Date/Time   COLORURINE YELLOW 07/25/2019 1730   APPEARANCEUR HAZY (A) 07/25/2019 1730   LABSPEC 1.013 07/25/2019 1730   PHURINE 8.0 07/25/2019 1730   GLUCOSEU NEGATIVE 07/25/2019 1730   HGBUR LARGE (A) 07/25/2019 1730   BILIRUBINUR NEGATIVE 07/25/2019 1730   KETONESUR  NEGATIVE 07/25/2019 1730   PROTEINUR 100 (A) 07/25/2019 1730   UROBILINOGEN 0.2 12/06/2012 0812   NITRITE POSITIVE (A) 07/25/2019 1730   LEUKOCYTESUR LARGE (A) 07/25/2019 1730  Imaging Results:    No results found.     Assessment & Plan:    Principal Problem:   Sepsis ([place]) Active Problems:   HTN (hypertension)   Hyponatremia   Acute lower UTI  Sepsis (fever, tachycardia, elevated wbc) Blood culture x2 Urine culture vanco iv, cefepime iv pharmacy to dose  Acute lower uti w hematuria Urine culture pending abx as above Stop aspirin  Bph w bladder neck contracture s/p cystoscopy  w injection of mitomycin C into bladder neck 07/19/2019 DC Flomax Cont Uroxatral 10mg  po qhs Cont Tropsium Chloride 60mg  po qday  Hypertension Cont Amlodipine 5mg  po qday  Hypothyroidism Cont Levothyroxine 137 micrograms po qday  Gerd Cont PPI  Dementia, Anxiety Cont Aricept 5mg  po qday Cont Namenda 5mg  po bid Cont Depakote 125mg  po qhs Cont Gabapentin 600mg  po bid Cont Zyprexa 15mg  po qhs   DVT Prophylaxis-   SCDs   AM Labs Ordered, also please review Full Orders  Family Communication: Admission, patients condition and plan of care including tests being ordered have been discussed with the patient  who indicate understanding and agree with the plan and Code Status.  Code Status:  FULL CODE per patient, wife present with patient   Admission status: Inpatient: Based on patients clinical presentation and evaluation of above clinical data, I have made determination that patient meets Inpatient criteria at this time.  Pt has sepsis secondary to complicated acute lower uti and will require iv abx and iv fluid.  Pt has high risk of clinical deterioration.  Pt will require . 2 nites stay  Time spent in minutes : 70 minutes   [name] M.D on 07/25/2019 at 8:13 PM

## 2019-07-25 NOTE — Progress Notes (Signed)
Pharmacy Antibiotic Note  Christian Miller. is a 84 y.o. male admitted on 07/25/2019 with sepsis.  Pharmacy has been consulted for vancomycin and cefepime dosing.  Plan: Vancomycin 750mg  IV every 12 hours.  Goal trough 15-20 mcg/mL. cefepime 2gm iv q12h  Height: 5\' 11"  (180.3 cm) Weight: 187 lb (84.8 kg) IBW/kg (Calculated) : 75.3  Temp (24hrs), Avg:100.6 F (38.1 C), Min:100.3 F (37.9 C), Max:100.8 F (38.2 C)  Recent Labs  Lab 07/25/19 1730 07/25/19 1813  WBC 21.7*  --   CREATININE 1.12  --   LATICACIDVEN 1.6 1.3    Estimated Creatinine Clearance: 55.1 mL/min (by C-G formula based on SCr of 1.12 mg/dL).    Allergies  Allergen Reactions  . Pneumococcal Vaccines Rash    Antimicrobials this admission: 10/11 cefepime >>  10/11 vancomycin >>  Microbiology results: 10/11 BCx: sent 10/11 UCx: sent  10/11 Covid 19 sent  Thank you for allowing pharmacy to be a part of this patient's care.  Donna Christen Rashan Rounsaville 07/25/2019 9:11 PM

## 2019-07-26 DIAGNOSIS — N390 Urinary tract infection, site not specified: Secondary | ICD-10-CM

## 2019-07-26 DIAGNOSIS — T83511A Infection and inflammatory reaction due to indwelling urethral catheter, initial encounter: Principal | ICD-10-CM

## 2019-07-26 DIAGNOSIS — A419 Sepsis, unspecified organism: Secondary | ICD-10-CM

## 2019-07-26 DIAGNOSIS — N320 Bladder-neck obstruction: Secondary | ICD-10-CM

## 2019-07-26 LAB — CBC
HCT: 33.8 % — ABNORMAL LOW (ref 39.0–52.0)
Hemoglobin: 10.5 g/dL — ABNORMAL LOW (ref 13.0–17.0)
MCH: 27.9 pg (ref 26.0–34.0)
MCHC: 31.1 g/dL (ref 30.0–36.0)
MCV: 89.7 fL (ref 80.0–100.0)
Platelets: 299 10*3/uL (ref 150–400)
RBC: 3.77 MIL/uL — ABNORMAL LOW (ref 4.22–5.81)
RDW: 14.3 % (ref 11.5–15.5)
WBC: 21.9 10*3/uL — ABNORMAL HIGH (ref 4.0–10.5)
nRBC: 0 % (ref 0.0–0.2)

## 2019-07-26 LAB — COMPREHENSIVE METABOLIC PANEL
ALT: 10 U/L (ref 0–44)
AST: 13 U/L — ABNORMAL LOW (ref 15–41)
Albumin: 3.1 g/dL — ABNORMAL LOW (ref 3.5–5.0)
Alkaline Phosphatase: 77 U/L (ref 38–126)
Anion gap: 9 (ref 5–15)
BUN: 22 mg/dL (ref 8–23)
CO2: 25 mmol/L (ref 22–32)
Calcium: 8.8 mg/dL — ABNORMAL LOW (ref 8.9–10.3)
Chloride: 100 mmol/L (ref 98–111)
Creatinine, Ser: 1.08 mg/dL (ref 0.61–1.24)
GFR calc Af Amer: >60 mL/min (ref >60)
GFR calc non Af Amer: >60 mL/min (ref >60)
Glucose, Bld: 97 mg/dL (ref 70–99)
Potassium: 4.4 mmol/L (ref 3.5–5.1)
Sodium: 134 mmol/L — ABNORMAL LOW (ref 135–145)
Total Bilirubin: 0.7 mg/dL (ref 0.3–1.2)
Total Protein: 6.7 g/dL (ref 6.5–8.1)

## 2019-07-26 MED ORDER — POLYETHYLENE GLYCOL 3350 17 G PO PACK
17.0000 g | PACK | Freq: Every day | ORAL | Status: DC
  Administered 2019-07-26 – 2019-07-28 (×3): 17 g via ORAL
  Filled 2019-07-26 (×3): qty 1

## 2019-07-26 NOTE — Plan of Care (Signed)
  Problem: Education: Goal: Knowledge of General Education information will improve Description: Including pain rating scale, medication(s)/side effects and non-pharmacologic comfort measures Outcome: Progressing   Problem: Clinical Measurements: Goal: Ability to maintain clinical measurements within normal limits will improve Outcome: Progressing Goal: Diagnostic test results will improve Outcome: Progressing   Problem: Elimination: Goal: Will not experience complications related to urinary retention Outcome: Progressing   Problem: Pain Managment: Goal: General experience of comfort will improve Outcome: Progressing

## 2019-07-26 NOTE — Progress Notes (Signed)
PROGRESS NOTE  Christian Miller [id number] DOB: [birth year] DOA: 07/25/2019 PCP: [name], MD  Brief History:  84 year old male with a history of COPD, hypertension, cognitive impairment, depression/anxiety presenting with 1 to 2-day history of fevers, chills, lower abdominal pain, dysuria/hematuria, nausea and lower abdominal pain.  The patient underwent a cystoscopy with bladder neck incision for his bladder neck contracture on 07/19/2019.  The patient was discharged home with an indwelling Foley catheter.  The patient also had some nausea without any frank emesis.  He had denied any chest pain, coughing, hemoptysis, shortness of breath. In the emergency department, the patient had temperature of 100.8 F with tachycardia in the 100s.  He was hemodynamically stable with oxygen saturation 95% room air.  BMP and LFTs were unremarkable.  WBC was 21.7 and lactic acid 1.6.  Urinalysis showed >50 WBCs.  The patient was started empirically on vancomycin and cefepime.  Assessment/Plan: Sepsis -Present at time of admission -Secondary to UTI -Lactic acid 1.6 -Continue vancomycin and cefepime pending culture data -cotninue IVF  Urinary tract infection/CAUTI -07/25/2019 UA >50WBC -Continue vancomycin and cefepime pending culture data  Bladder neck contracture/bladder outlet obstruction s/p TURP x2 and now transurethral incision of bladder neckc contracture / mito-C injection 07/19/19 by McKenzie at [facility]  -Urology tentatively planning voiding trial 07/28/2019 -continue urotraxal and enablex  Essential hypertension -Continue amlodipine  Dementia -Continue Aricept and Namenda -Continue Zyprexa at bedtime  Hypothyroidism -Continue Synthroid  Depression/Anxiety -continue zoloft  Hyponatremia -due to volume depletion -continue IVF    Disposition Plan:   Home in 1-2 days  Family Communication:   Left VM with spouse  Consultants:  urology  Code Status:  FULL   DVT Prophylaxis:  SCDs   Procedures: As Listed in Progress Note Above  Antibiotics: vanco 10/11>>> Cefepime 10/11>>       Subjective: Patient denies fevers, chills, headache, chest pain, dyspnea, nausea, vomiting, diarrhea, abdominal pain, dysuria, hematuria, hematochezia, and melena.   Objective: Vitals:   07/25/19 2100 07/25/19 2100 07/26/19 0508 07/26/19 0845  BP:  139/64 126/62 123/65  Pulse:  85 (!) 57 63  Resp:  16 16 18   Temp: (!) 100.8 F (38.2 C)  99.1 F (37.3 C) 98.4 F (36.9 C)  TempSrc: Oral  Oral Oral  SpO2:  96% 95% 95%  Weight:   87.9 kg   Height:        Intake/Output Summary (Last 24 hours) at 07/26/2019 1024 Last data filed at 07/26/2019 0840 Gross per 24 hour  Intake 613.47 ml  Output 1525 ml  Net -911.53 ml   Weight change:  Exam:   General:  Pt is alert, follows commands appropriately, not in acute distress  HEENT: No icterus, No thrush, No neck mass, [place]/AT  Cardiovascular: RRR, S1/S2, no rubs, no gallops  Respiratory: bibasilar rales. Min basilar wheeze  Abdomen: Soft/+BS, non tender, non distended, no guarding  Extremities: No edema, No lymphangitis, No petechiae, No rashes, no synovitis   Data Reviewed: I have personally reviewed following labs and imaging studies Basic Metabolic Panel: Recent Labs  Lab 07/25/19 1730 07/26/19 0505  NA 132* 134*  K 4.8 4.4  CL 101 100  CO2 23 25  GLUCOSE 123* 97  BUN 21 22  CREATININE 1.12 1.08  CALCIUM 8.9 8.8*   Liver Function Tests: Recent Labs  Lab 07/25/19 1730 07/26/19 0505  AST 16 13*  ALT 11 10  ALKPHOS 89 77  BILITOT 0.3 0.7  PROT 7.5 6.7  ALBUMIN 3.7 3.1*   No results for input(s): LIPASE, AMYLASE in the last 168 hours. No results for input(s): AMMONIA in the last 168 hours. Coagulation Profile: No results for input(s): INR, PROTIME in the last 168 hours. CBC: Recent Labs  Lab 07/25/19 1730 07/26/19 0505  WBC 21.7* 21.9*  NEUTROABS 17.5*  --   HGB  11.3* 10.5*  HCT 36.3* 33.8*  MCV 89.2 89.7  PLT 347 299   Cardiac Enzymes: No results for input(s): CKTOTAL, CKMB, CKMBINDEX, TROPONINI in the last 168 hours. BNP: Invalid input(s): POCBNP CBG: No results for input(s): GLUCAP in the last 168 hours. HbA1C: No results for input(s): HGBA1C in the last 72 hours. Urine analysis:    Component Value Date/Time   COLORURINE YELLOW 07/25/2019 1730   APPEARANCEUR HAZY (A) 07/25/2019 1730   LABSPEC 1.013 07/25/2019 1730   PHURINE 8.0 07/25/2019 1730   GLUCOSEU NEGATIVE 07/25/2019 1730   HGBUR LARGE (A) 07/25/2019 1730   BILIRUBINUR NEGATIVE 07/25/2019 1730   KETONESUR NEGATIVE 07/25/2019 1730   PROTEINUR 100 (A) 07/25/2019 1730   UROBILINOGEN 0.2 12/06/2012 0812   NITRITE POSITIVE (A) 07/25/2019 1730   LEUKOCYTESUR LARGE (A) 07/25/2019 1730   Sepsis Labs: @LABRCNTIP (procalcitonin:4,lacticidven:4) ) Recent Results (from the past 240 hour(s))  Culture, blood (routine x 2)     Status: None (Preliminary result)   Collection Time: 07/25/19  5:31 PM   Specimen: BLOOD RIGHT ARM  Result Value Ref Range Status   Specimen Description BLOOD RIGHT ARM  Final   Special Requests   Final    BOTTLES DRAWN AEROBIC AND ANAEROBIC Blood Culture adequate volume   Culture   Final    NO GROWTH < 24 HOURS Performed at [facility], [address], [place], [place] [address]    Report Status PENDING  Incomplete  SARS Coronavirus 2 by RT PCR (hospital order, performed in [place] hospital lab)     Status: None   Collection Time: 07/25/19  5:31 PM  Result Value Ref Range Status   SARS Coronavirus 2 NEGATIVE NEGATIVE Final    Comment: (NOTE) If result is NEGATIVE SARS-CoV-2 target nucleic acids are NOT DETECTED. The SARS-CoV-2 RNA is generally detectable in upper and lower  respiratory specimens during the acute phase of infection. The lowest  concentration of SARS-CoV-2 viral copies this assay can detect is 250  copies / mL. A negative result does  not preclude SARS-CoV-2 infection  and should not be used as the sole basis for treatment or other  patient management decisions.  A negative result may occur with  improper specimen collection / handling, submission of specimen other  than nasopharyngeal swab, presence of viral mutation(s) within the  areas targeted by this assay, and inadequate number of viral copies  (<250 copies / mL). A negative result must be combined with clinical  observations, patient history, and epidemiological information. If result is POSITIVE SARS-CoV-2 target nucleic acids are DETECTED. The SARS-CoV-2 RNA is generally detectable in upper and lower  respiratory specimens dur ing the acute phase of infection.  Positive  results are indicative of active infection with SARS-CoV-2.  Clinical  correlation with patient history and other diagnostic information is  necessary to determine patient infection status.  Positive results do  not rule out bacterial infection or co-infection with other viruses. If result is PRESUMPTIVE POSTIVE SARS-CoV-2 nucleic acids MAY BE PRESENT.   A presumptive positive result was obtained on the submitted specimen  and confirmed on repeat testing.  While 2019 novel coronavirus  (SARS-CoV-2) nucleic acids may be present in the submitted sample  additional confirmatory testing may be necessary for epidemiological  and / or clinical management purposes  to differentiate between  SARS-CoV-2 and other Sarbecovirus currently known to infect humans.  If clinically indicated additional testing with an alternate test  methodology [phone]) is advised. The SARS-CoV-2 RNA is generally  detectable in upper and lower respiratory sp ecimens during the acute  phase of infection. The expected result is Negative. Fact Sheet for Patients:  StrictlyIdeas.no Fact Sheet for Healthcare Providers: BankingDealers.co.za This test is not yet approved or  cleared by the [place] FDA and has been authorized for detection and/or diagnosis of SARS-CoV-2 by FDA under an Emergency Use Authorization (EUA).  This EUA will remain in effect (meaning this test can be used) for the duration of the COVID-19 declaration under Section 564(b)(1) of the Act, 21 U.S.C. section 360bbb-3(b)(1), unless the authorization is terminated or revoked sooner. Performed at [facility], [address], [place], [place] [address]   Culture, blood (routine x 2)     Status: None (Preliminary result)   Collection Time: 07/25/19  6:13 PM   Specimen: Left Antecubital; Blood  Result Value Ref Range Status   Specimen Description LEFT ANTECUBITAL  Final   Special Requests   Final    BOTTLES DRAWN AEROBIC AND ANAEROBIC Blood Culture adequate volume   Culture   Final    NO GROWTH < 24 HOURS Performed at [facility], [address] [place],  [address]    Report Status PENDING  Incomplete     Scheduled Meds: . alfuzosin  10 mg Oral QHS  . amLODipine  5 mg Oral Daily  . Chlorhexidine Gluconate Cloth  6 each Topical Daily  . darifenacin  7.5 mg Oral Daily  . divalproex  125 mg Oral QHS  . donepezil  5 mg Oral QHS  . folic acid  1 mg Oral QHS  . gabapentin  600 mg Oral BID  . levothyroxine  137 mcg Oral QAC breakfast  . memantine  5 mg Oral BID  . OLANZapine  15 mg Oral QHS  . pantoprazole  40 mg Oral Daily  . polyethylene glycol  17 g Oral Daily  . sertraline  25 mg Oral QHS   Continuous Infusions: . sodium chloride 75 mL/hr at 07/25/19 2124  . ceFEPime (MAXIPIME) IV 2 g (07/26/19 0952)  . vancomycin 750 mg (07/26/19 0820)    Procedures/Studies: No results found.  Orson Eva, DO  [facility] Pager [phone]  If 7PM-7AM, please contact night-coverage [email or web address] Password [email or web address] 07/26/2019, 10:24 AM   LOS: 1 day

## 2019-07-27 DIAGNOSIS — E871 Hypo-osmolality and hyponatremia: Secondary | ICD-10-CM

## 2019-07-27 DIAGNOSIS — A4150 Gram-negative sepsis, unspecified: Secondary | ICD-10-CM

## 2019-07-27 DIAGNOSIS — T83511D Infection and inflammatory reaction due to indwelling urethral catheter, subsequent encounter: Secondary | ICD-10-CM

## 2019-07-27 LAB — CBC
HCT: 31.9 % — ABNORMAL LOW (ref 39.0–52.0)
Hemoglobin: 9.9 g/dL — ABNORMAL LOW (ref 13.0–17.0)
MCH: 27.7 pg (ref 26.0–34.0)
MCHC: 31 g/dL (ref 30.0–36.0)
MCV: 89.4 fL (ref 80.0–100.0)
Platelets: 278 10*3/uL (ref 150–400)
RBC: 3.57 MIL/uL — ABNORMAL LOW (ref 4.22–5.81)
RDW: 13.9 % (ref 11.5–15.5)
WBC: 15 10*3/uL — ABNORMAL HIGH (ref 4.0–10.5)
nRBC: 0 % (ref 0.0–0.2)

## 2019-07-27 LAB — BASIC METABOLIC PANEL
Anion gap: 10 (ref 5–15)
BUN: 20 mg/dL (ref 8–23)
CO2: 23 mmol/L (ref 22–32)
Calcium: 8.6 mg/dL — ABNORMAL LOW (ref 8.9–10.3)
Chloride: 103 mmol/L (ref 98–111)
Creatinine, Ser: 1.06 mg/dL (ref 0.61–1.24)
GFR calc Af Amer: >60 mL/min (ref >60)
GFR calc non Af Amer: >60 mL/min (ref >60)
Glucose, Bld: 101 mg/dL — ABNORMAL HIGH (ref 70–99)
Potassium: 4.1 mmol/L (ref 3.5–5.1)
Sodium: 136 mmol/L (ref 135–145)

## 2019-07-27 LAB — MAGNESIUM: Magnesium: 2 mg/dL (ref 1.7–2.4)

## 2019-07-27 MED ORDER — ZOLPIDEM TARTRATE 5 MG PO TABS
5.0000 mg | ORAL_TABLET | Freq: Once | ORAL | Status: AC
  Administered 2019-07-27: 5 mg via ORAL
  Filled 2019-07-27: qty 1

## 2019-07-27 NOTE — Plan of Care (Signed)
Patient alert and oriented x 4 and his vitals are stable. Emptied large amount of urine from patient's foley. Patient and nurse discussed plan of care and how he feels about the condition he is in. Patient verbalized feelings and had a positive spiritual outlook on his situation. He states he was having difficulty sleeping, and requested to read his Bible. Patient was repositioned in bed with pillows and given a dose of his Vistaril for anxiety. Patient currently in bed with his eyes open. Will continue to monitor patient.

## 2019-07-27 NOTE — Progress Notes (Addendum)
PROGRESS NOTE  Christian Miller [id number] DOB: Oct 05, 1935 DOA: 07/25/2019 PCP: [name], MD  Brief History:  84 year old male with a history of COPD, hypertension, cognitive impairment, depression/anxiety presenting with 1 to 2-day history of fevers, chills, lower abdominal pain, dysuria/hematuria, nausea and lower abdominal pain.  The patient underwent a cystoscopy with bladder neck incision for his bladder neck contracture on 07/19/2019.  The patient was discharged home with an indwelling Foley catheter.  The patient also had some nausea without any frank emesis.  He had denied any chest pain, coughing, hemoptysis, shortness of breath. In the emergency department, the patient had temperature of 100.8 F with tachycardia in the 100s.  He was hemodynamically stable with oxygen saturation 95% room air.  BMP and LFTs were unremarkable.  WBC was 21.7 and lactic acid 1.6.  Urinalysis showed >50 WBCs.  The patient was started empirically on vancomycin and cefepime.  Assessment/Plan: Sepsis -Present at time of admission -Secondary to UTI -Lactic acid 1.6 -Continue vancomycin and cefepime pending culture data -continue IVF>>>saline lock  Urinary tract infection/CAUTI -07/25/2019 UA >50WBC -prelim culture= GNR -Continue cefepime pending culture data -d/c vanco -plan to d/c home 10/14 before urology appointment once culture data is back.  Bladder neck contracture/bladder outlet obstruction s/p TURP x2 and now transurethral incision of bladder neckc contracture / mito-C injection 07/19/19 by [facility]enzie at [facility]  -Urology tentatively planning voiding trial 07/28/2019 -continue urotraxal and enablex -pt has appointment with urology, Dr. Alyson Ingles 10/14 at 1330  Essential hypertension -Continue amlodipine  Dementia -Continue Aricept and Namenda -Continue Zyprexa at bedtime  Hypothyroidism -Continue Synthroid  Depression/Anxiety -continue zoloft   Hyponatremia -due to volume depletion -continue IVF -improved    Disposition Plan:   Home 10/14  Family Communication:   updated spouse  Consultants:  urology  Code Status:  FULL  DVT Prophylaxis:  SCDs   Procedures: As Listed in Progress Note Above  Antibiotics: vanco 10/11>>>10/12 Cefepime 10/11>>         Subjective: Patient states, "I'm feeling 10 times better today".  Patient denies fevers, chills, headache, chest pain, dyspnea, nausea, vomiting, diarrhea, abdominal pain, dysuria, hematuria, hematochezia, and melena.   Objective: Vitals:   07/26/19 2040 07/27/19 0500 07/27/19 0523 07/27/19 1502  BP: (!) 156/68  135/66 123/66  Pulse: 70  62 64  Resp:   20 20  Temp:   98.1 F (36.7 C) 98.3 F (36.8 C)  TempSrc:   Oral   SpO2:   94% 97%  Weight:  90.9 kg    Height:        Intake/Output Summary (Last 24 hours) at 07/27/2019 1538 Last data filed at 07/27/2019 1447 Gross per 24 hour  Intake 833.07 ml  Output 7250 ml  Net -6416.93 ml   Weight change: 6.077 kg Exam:   General:  Pt is alert, follows commands appropriately, not in acute distress  HEENT: No icterus, No thrush, No neck mass, [place]/AT  Cardiovascular: RRR, S1/S2, no rubs, no gallops  Respiratory: bibasilar rales.  No wheeze  Abdomen: Soft/+BS, non tender, non distended, no guarding  Extremities: No edema, No lymphangitis, No petechiae, No rashes, no synovitis   Data Reviewed: I have personally reviewed following labs and imaging studies Basic Metabolic Panel: Recent Labs  Lab 07/25/19 1730 07/26/19 0505 07/27/19 0546  NA 132* 134* 136  K 4.8 4.4 4.1  CL 101 100 103  CO2 23 25 23   GLUCOSE 123*  97 101*  BUN 21 22 20   CREATININE 1.12 1.08 1.06  CALCIUM 8.9 8.8* 8.6*  MG  --   --  2.0   Liver Function Tests: Recent Labs  Lab 07/25/19 1730 07/26/19 0505  AST 16 13*  ALT 11 10  ALKPHOS 89 77  BILITOT 0.3 0.7  PROT 7.5 6.7  ALBUMIN 3.7 3.1*   No  results for input(s): LIPASE, AMYLASE in the last 168 hours. No results for input(s): AMMONIA in the last 168 hours. Coagulation Profile: No results for input(s): INR, PROTIME in the last 168 hours. CBC: Recent Labs  Lab 07/25/19 1730 07/26/19 0505 07/27/19 0546  WBC 21.7* 21.9* 15.0*  NEUTROABS 17.5*  --   --   HGB 11.3* 10.5* 9.9*  HCT 36.3* 33.8* 31.9*  MCV 89.2 89.7 89.4  PLT 347 299 278   Cardiac Enzymes: No results for input(s): CKTOTAL, CKMB, CKMBINDEX, TROPONINI in the last 168 hours. BNP: Invalid input(s): POCBNP CBG: No results for input(s): GLUCAP in the last 168 hours. HbA1C: No results for input(s): HGBA1C in the last 72 hours. Urine analysis:    Component Value Date/Time   COLORURINE YELLOW 07/25/2019 1730   APPEARANCEUR HAZY (A) 07/25/2019 1730   LABSPEC 1.013 07/25/2019 1730   PHURINE 8.0 07/25/2019 1730   GLUCOSEU NEGATIVE 07/25/2019 1730   HGBUR LARGE (A) 07/25/2019 1730   BILIRUBINUR NEGATIVE 07/25/2019 1730   KETONESUR NEGATIVE 07/25/2019 1730   PROTEINUR 100 (A) 07/25/2019 1730   UROBILINOGEN 0.2 12/06/2012 0812   NITRITE POSITIVE (A) 07/25/2019 1730   LEUKOCYTESUR LARGE (A) 07/25/2019 1730   Sepsis Labs: @LABRCNTIP (procalcitonin:4,lacticidven:4) ) Recent Results (from the past 240 hour(s))  Urine culture     Status: Abnormal (Preliminary result)   Collection Time: 07/25/19  5:30 PM   Specimen: Urine, Clean Catch  Result Value Ref Range Status   Specimen Description   Final    URINE, CLEAN CATCH Performed at [facility], [address], [place], [place] [address]    Special Requests   Final    NONE Performed at [facility], [address], [place], [place] [address]    Culture (A)  Final    >=100,000 COLONIES/mL PSEUDOMONAS AERUGINOSA SUSCEPTIBILITIES TO FOLLOW Performed at [facility], [place] [address], [place], [place] [address]    Report Status PENDING  Incomplete  Culture, blood (routine x 2)     Status: None (Preliminary  result)   Collection Time: 07/25/19  5:31 PM   Specimen: BLOOD RIGHT ARM  Result Value Ref Range Status   Specimen Description BLOOD RIGHT ARM  Final   Special Requests   Final    BOTTLES DRAWN AEROBIC AND ANAEROBIC Blood Culture adequate volume   Culture   Final    NO GROWTH 2 DAYS Performed at [facility], [address] [place], [place] [address]    Report Status PENDING  Incomplete  SARS Coronavirus 2 by RT PCR (hospital order, performed in [place] hospital lab)     Status: None   Collection Time: 07/25/19  5:31 PM  Result Value Ref Range Status   SARS Coronavirus 2 NEGATIVE NEGATIVE Final    Comment: (NOTE) If result is NEGATIVE SARS-CoV-2 target nucleic acids are NOT DETECTED. The SARS-CoV-2 RNA is generally detectable in upper and lower  respiratory specimens during the acute phase of infection. The lowest  concentration of SARS-CoV-2 viral copies this assay can detect is 250  copies / mL. A negative result does not preclude SARS-CoV-2  infection  and should not be used as the sole basis for treatment or other  patient management decisions.  A negative result may occur with  improper specimen collection / handling, submission of specimen other  than nasopharyngeal swab, presence of viral mutation(s) within the  areas targeted by this assay, and inadequate number of viral copies  (<250 copies / mL). A negative result must be combined with clinical  observations, patient history, and epidemiological information. If result is POSITIVE SARS-CoV-2 target nucleic acids are DETECTED. The SARS-CoV-2 RNA is generally detectable in upper and lower  respiratory specimens dur ing the acute phase of infection.  Positive  results are indicative of active infection with SARS-CoV-2.  Clinical  correlation with patient history and other diagnostic information is  necessary to determine patient infection status.  Positive results do  not rule out bacterial infection or co-infection  with other viruses. If result is PRESUMPTIVE POSTIVE SARS-CoV-2 nucleic acids MAY BE PRESENT.   A presumptive positive result was obtained on the submitted specimen  and confirmed on repeat testing.  While 2019 novel coronavirus  (SARS-CoV-2) nucleic acids may be present in the submitted sample  additional confirmatory testing may be necessary for epidemiological  and / or clinical management purposes  to differentiate between  SARS-CoV-2 and other Sarbecovirus currently known to infect humans.  If clinically indicated additional testing with an alternate test  methodology [phone]) is advised. The SARS-CoV-2 RNA is generally  detectable in upper and lower respiratory sp ecimens during the acute  phase of infection. The expected result is Negative. Fact Sheet for Patients:  StrictlyIdeas.no Fact Sheet for Healthcare Providers: BankingDealers.co.za This test is not yet approved or cleared by the [place] FDA and has been authorized for detection and/or diagnosis of SARS-CoV-2 by FDA under an Emergency Use Authorization (EUA).  This EUA will remain in effect (meaning this test can be used) for the duration of the COVID-19 declaration under Section 564(b)(1) of the Act, 21 U.S.C. section 360bbb-3(b)(1), unless the authorization is terminated or revoked sooner. Performed at [facility], [address], [place], [place] [address]   Culture, blood (routine x 2)     Status: None (Preliminary result)   Collection Time: 07/25/19  6:13 PM   Specimen: Left Antecubital; Blood  Result Value Ref Range Status   Specimen Description LEFT ANTECUBITAL  Final   Special Requests   Final    BOTTLES DRAWN AEROBIC AND ANAEROBIC Blood Culture adequate volume   Culture   Final    NO GROWTH 2 DAYS Performed at [facility], [address], [place], [place] [address]    Report Status PENDING  Incomplete     Scheduled Meds: . alfuzosin  10 mg Oral QHS   . amLODipine  5 mg Oral Daily  . Chlorhexidine Gluconate Cloth  6 each Topical Daily  . darifenacin  7.5 mg Oral Daily  . divalproex  125 mg Oral QHS  . donepezil  5 mg Oral QHS  . folic acid  1 mg Oral QHS  . gabapentin  600 mg Oral BID  . levothyroxine  137 mcg Oral QAC breakfast  . memantine  5 mg Oral BID  . OLANZapine  15 mg Oral QHS  . pantoprazole  40 mg Oral Daily  . polyethylene glycol  17 g Oral Daily  . sertraline  25 mg Oral QHS   Continuous Infusions: . sodium chloride 75 mL/hr at 07/26/19 1502  . ceFEPime (MAXIPIME) IV 2 g (07/27/19 1119)  Procedures/Studies: No results found.  Orson Eva, DO  [facility] Pager [phone]  If 7PM-7AM, please contact night-coverage [email or web address] Password [email or web address] 07/27/2019, 3:38 PM   LOS: 2 days

## 2019-07-28 ENCOUNTER — Ambulatory Visit (INDEPENDENT_AMBULATORY_CARE_PROVIDER_SITE_OTHER): Payer: PPO | Admitting: Urology

## 2019-07-28 DIAGNOSIS — N401 Enlarged prostate with lower urinary tract symptoms: Secondary | ICD-10-CM

## 2019-07-28 DIAGNOSIS — A4152 Sepsis due to Pseudomonas: Secondary | ICD-10-CM

## 2019-07-28 DIAGNOSIS — N3281 Overactive bladder: Secondary | ICD-10-CM

## 2019-07-28 LAB — CBC
HCT: 32.9 % — ABNORMAL LOW (ref 39.0–52.0)
Hemoglobin: 10 g/dL — ABNORMAL LOW (ref 13.0–17.0)
MCH: 27.1 pg (ref 26.0–34.0)
MCHC: 30.4 g/dL (ref 30.0–36.0)
MCV: 89.2 fL (ref 80.0–100.0)
Platelets: 307 10*3/uL (ref 150–400)
RBC: 3.69 MIL/uL — ABNORMAL LOW (ref 4.22–5.81)
RDW: 14 % (ref 11.5–15.5)
WBC: 12.2 10*3/uL — ABNORMAL HIGH (ref 4.0–10.5)
nRBC: 0 % (ref 0.0–0.2)

## 2019-07-28 LAB — URINE CULTURE: Culture: >=100000 — AB

## 2019-07-28 MED ORDER — DARIFENACIN HYDROBROMIDE ER 7.5 MG PO TB24: 7.5000 mg | ORAL_TABLET | Freq: Every day | ORAL | 1 refills | Status: DC

## 2019-07-28 MED ORDER — CIPROFLOXACIN HCL 250 MG PO TABS
500.0000 mg | ORAL_TABLET | Freq: Two times a day (BID) | ORAL | Status: DC
  Administered 2019-07-28: 500 mg via ORAL
  Filled 2019-07-28: qty 2

## 2019-07-28 MED ORDER — CIPROFLOXACIN HCL 500 MG PO TABS: 500.0000 mg | ORAL_TABLET | Freq: Two times a day (BID) | ORAL | 0 refills | Status: AC

## 2019-07-28 NOTE — Discharge Summary (Signed)
Physician Discharge Summary  Christian Miller Sr. [id number] DOB: 12-03-1934 DOA: 07/25/2019  PCP: Christian Bis, MD  Admit date: 07/25/2019 Discharge date: 07/28/2019  Time spent: 35 minutes  Recommendations for Outpatient Follow-up:  1. Repeat CBC to assure resolution of leukocytosis. 2. Repeat basic metabolic panel to follow electrolytes and renal function   Discharge Diagnoses:  Principal Problem:   Sepsis ([place]) Active Problems:   HTN (hypertension)   Hyponatremia   Acute lower UTI   Sepsis due to undetermined organism [facility])   Bladder outlet obstruction   Urinary tract infection associated with indwelling urethral catheter ([place])   Gram negative sepsis ([place])   Discharge Condition: Stable and improved.  Patient discharged home with instruction to follow-up with PCP and urology service.  Diet recommendation: Heart healthy diet  Filed Weights   07/26/19 0508 07/27/19 0500 07/28/19 0459  Weight: 87.9 kg 90.9 kg 91 kg    Brief history of present illness:  As per H&P written by Dr. Jani Miller on 07/25/2019 84 year old male with a history of COPD, hypertension, cognitive impairment, depression/anxiety presenting with 1 to 2-day history of fevers, chills, lower abdominal pain, dysuria/hematuria, nausea and lower abdominal pain.The patient underwent a cystoscopy with bladder neck incision for his bladder neck contracture on 07/19/2019. The patient was discharged home with an indwelling Foley catheter. The patient also had some nausea without any frank emesis. He had denied any chest pain, coughing, hemoptysis, shortness of breath. In the emergency department, the patient had temperature of 100.8 F with tachycardia in the 100s. He was hemodynamically stable with oxygen saturation 95% room air. BMP and LFTs were unremarkable. WBC was 21.7 and lactic acid 1.6. Urinalysis showed >50 WBCs.The patient was started empirically on vancomycin and cefepime.  Hospital Course:   1-sepsis secondary to Pseudomonas urinary tract infection associated with Foley catheter. -Sepsis features essentially resolved at time of discharge -Following culture data patient antibiotics transition to oral ciprofloxacin and planning for 8 more days to complete treatment. -Patient advised to maintain adequate hydration -Follow-up with urology service and primary care doctor as an outpatient.  2-bladder neck contracture/bladder outlet obstruction -Patient is status post TURP x2 and now transurethral incision of bladder neck contracture/Mito- C injection on 07/19/19 by Dr. Alyson Miller. -Continue the use of urotraxal and Enablex -Outpatient follow-up with urology with tentative voiding trial and further definitive decisions. -complete antibiotics therapy as mentioned above. -advise to maintain adequate hydration   3-essential hypertension -Continue amlodipine.  4-dementia with behavioral disorder -Continue Aricept and Namenda -Mood is stable and no active agitation appreciated -Continue Zyprexa Zoloft  5-hypothyroidism -Continue Synthroid.  6-hyponatremia in the setting of volume depletion and dehydration -Improved/resolved with fluid resuscitation -Repeat basic metabolic panel at follow-up visit to reassess electrolytes trend.  7-Lactic acidosis  -assocaited with sepsis -resolved with IVF's and ABX's treatment.   Procedures:  See below for x-ray reports.  Consultations:  Urology service.  Discharge Exam: Vitals:   07/27/19 2035 07/28/19 0459  BP: (!) 142/73 (!) 150/71  Pulse: 64 66  Resp: 18 16  Temp: 98.9 F (37.2 C) 98.6 F (37 C)  SpO2: 97% 96%    General: Alert, awake and oriented x3; in no acute distress.  Hard of hearing.  Afebrile, no nausea, no vomiting. Cardiovascular: S1-S2, no rubs, no gallops, no murmurs. Respiratory: Clear to auscultation bilaterally Abdomen: Soft, positive bowel sounds, nontender, nondistended, no guarding. Urology: Foley  catheter in place. Extremities: No edema, no cyanosis or clubbing.  Discharge Instructions   Discharge  Instructions    Diet - low sodium heart healthy   Complete by: As directed    Discharge instructions   Complete by: As directed    Take medications as prescribed Maintain adequate hydration Follow heart healthy diet Follow-up with PCP in 2 weeks Follow-up with urology service as instructed.     Allergies as of 07/28/2019      Reactions   Pneumococcal Vaccines Rash      Medication List    STOP taking these medications   naproxen sodium 220 MG tablet Commonly known as: ALEVE     TAKE these medications   acetaminophen 650 MG CR tablet Commonly known as: TYLENOL Take 650 mg by mouth every 8 (eight) hours as needed for pain.   alfuzosin 10 MG 24 hr tablet Commonly known as: UROXATRAL Take 10 mg by mouth at bedtime.   amLODipine 5 MG tablet Commonly known as: NORVASC Take 5 mg by mouth every morning. for high blood pressure   aspirin EC 81 MG tablet Take 81 mg by mouth every Monday, Wednesday, and Friday. At night.   ciprofloxacin 500 MG tablet Commonly known as: CIPRO Take 1 tablet (500 mg total) by mouth 2 (two) times daily for 16 doses.   darifenacin 7.5 MG 24 hr tablet Commonly known as: ENABLEX Take 1 tablet (7.5 mg total) by mouth daily. Start taking on: July 29, 2019   divalproex 125 MG DR tablet Commonly known as: DEPAKOTE Take 125 mg by mouth at bedtime.   donepezil 5 MG tablet Commonly known as: ARICEPT Take 5 mg by mouth at bedtime.   Fish Oil 1000 MG Caps Take 1,000 mg by mouth every other day.   Folate 400 MCG tablet Generic drug: folic acid Take 1 tablet by mouth at bedtime.   gabapentin 600 MG tablet Commonly known as: NEURONTIN Take 600 mg by mouth 3 (three) times daily.   HYDROcodone-acetaminophen 5-325 MG tablet Commonly known as: Norco Take 1 tablet by mouth every 4 (four) hours as needed for moderate pain. What changed:  when to take this   hydrOXYzine 10 MG tablet Commonly known as: ATARAX/VISTARIL Take 10 mg by mouth every 6 (six) hours as needed for anxiety.   levothyroxine 137 MCG tablet Commonly known as: SYNTHROID Take 137 mcg by mouth daily before breakfast.   memantine 5 MG tablet Commonly known as: NAMENDA Take 5 mg by mouth 2 (two) times daily.   multivitamins ther. w/minerals Tabs tablet Take 1 tablet by mouth daily.   OLANZapine 15 MG tablet Commonly known as: ZYPREXA Take 15 mg by mouth at bedtime.   omeprazole 20 MG capsule Commonly known as: PRILOSEC Take 20 mg by mouth 2 (two) times daily before a meal.   polyethylene glycol powder 17 GM/SCOOP powder Commonly known as: GLYCOLAX/MIRALAX Take 17 g by mouth daily.   sertraline 25 MG tablet Commonly known as: ZOLOFT Take 25 mg by mouth at bedtime.   tamsulosin 0.4 MG Caps capsule Commonly known as: FLOMAX Take 0.4 mg by mouth every morning.   Trospium Chloride 60 MG Cp24 Take 60 mg by mouth every morning.   Vitamin D3 50 MCG (2000 UT) Tabs Take 2,000 Units by mouth daily.      Allergies  Allergen Reactions  . Pneumococcal Vaccines Rash   Follow-up Information    Christian Bis, MD. Schedule an appointment as soon as possible for a visit in 2 week(s).   Specialty: Family Medicine Contact information: [place]  [place] [address] [phone]           The results of significant diagnostics from this hospitalization (including imaging, microbiology, ancillary and laboratory) are listed below for reference.    Significant Diagnostic Studies: No results found.  Microbiology: Recent Results (from the past 240 hour(s))  Urine culture     Status: Abnormal   Collection Time: 07/25/19  5:30 PM   Specimen: Urine, Clean Catch  Result Value Ref Range Status   Specimen Description   Final    URINE, CLEAN CATCH Performed at [facility], [address] [place], [place] [address]    Special Requests   Final     NONE Performed at [facility], [address], [place], [place] [address]    Culture >=100,000 COLONIES/mL PSEUDOMONAS AERUGINOSA (A)  Final   Report Status 07/28/2019 FINAL  Final   Organism ID, Bacteria PSEUDOMONAS AERUGINOSA (A)  Final      Susceptibility   Pseudomonas aeruginosa - MIC*    CEFTAZIDIME 4 SENSITIVE Sensitive     CIPROFLOXACIN <=0.25 SENSITIVE Sensitive     GENTAMICIN <=1 SENSITIVE Sensitive     IMIPENEM 2 SENSITIVE Sensitive     PIP/TAZO 8 SENSITIVE Sensitive     CEFEPIME 4 SENSITIVE Sensitive     * >=100,000 COLONIES/mL PSEUDOMONAS AERUGINOSA  Culture, blood (routine x 2)     Status: None (Preliminary result)   Collection Time: 07/25/19  5:31 PM   Specimen: BLOOD RIGHT ARM  Result Value Ref Range Status   Specimen Description BLOOD RIGHT ARM  Final   Special Requests   Final    BOTTLES DRAWN AEROBIC AND ANAEROBIC Blood Culture adequate volume   Culture   Final    NO GROWTH 3 DAYS Performed at [facility], [address], [place],  [address]    Report Status PENDING  Incomplete  SARS Coronavirus 2 by RT PCR (hospital order, performed in [place] hospital lab)     Status: None   Collection Time: 07/25/19  5:31 PM  Result Value Ref Range Status   SARS Coronavirus 2 NEGATIVE NEGATIVE Final    Comment: (NOTE) If result is NEGATIVE SARS-CoV-2 target nucleic acids are NOT DETECTED. The SARS-CoV-2 RNA is generally detectable in upper and lower  respiratory specimens during the acute phase of infection. The lowest  concentration of SARS-CoV-2 viral copies this assay can detect is 250  copies / mL. A negative result does not preclude SARS-CoV-2 infection  and should not be used as the sole basis for treatment or other  patient management decisions.  A negative result may occur with  improper specimen collection / handling, submission of specimen other  than nasopharyngeal swab, presence of viral mutation(s) within the  areas targeted by this assay, and  inadequate number of viral copies  (<250 copies / mL). A negative result must be combined with clinical  observations, patient history, and epidemiological information. If result is POSITIVE SARS-CoV-2 target nucleic acids are DETECTED. The SARS-CoV-2 RNA is generally detectable in upper and lower  respiratory specimens dur ing the acute phase of infection.  Positive  results are indicative of active infection with SARS-CoV-2.  Clinical  correlation with patient history and other diagnostic information is  necessary to determine patient infection status.  Positive results do  not rule out bacterial infection or co-infection with other viruses. If result is PRESUMPTIVE POSTIVE SARS-CoV-2 nucleic acids MAY BE PRESENT.   A presumptive positive result was obtained on the submitted specimen  and  confirmed on repeat testing.  While 2019 novel coronavirus  (SARS-CoV-2) nucleic acids may be present in the submitted sample  additional confirmatory testing may be necessary for epidemiological  and / or clinical management purposes  to differentiate between  SARS-CoV-2 and other Sarbecovirus currently known to infect humans.  If clinically indicated additional testing with an alternate test  methodology [phone]) is advised. The SARS-CoV-2 RNA is generally  detectable in upper and lower respiratory sp ecimens during the acute  phase of infection. The expected result is Negative. Fact Sheet for Patients:  StrictlyIdeas.no Fact Sheet for Healthcare Providers: BankingDealers.co.za This test is not yet approved or cleared by the [place] FDA and has been authorized for detection and/or diagnosis of SARS-CoV-2 by FDA under an Emergency Use Authorization (EUA).  This EUA will remain in effect (meaning this test can be used) for the duration of the COVID-19 declaration under Section 564(b)(1) of the Act, 21 U.S.C. section 360bbb-3(b)(1), unless the  authorization is terminated or revoked sooner. Performed at [facility], [address], [place], [place] [address]   Culture, blood (routine x 2)     Status: None (Preliminary result)   Collection Time: 07/25/19  6:13 PM   Specimen: Left Antecubital; Blood  Result Value Ref Range Status   Specimen Description LEFT ANTECUBITAL  Final   Special Requests   Final    BOTTLES DRAWN AEROBIC AND ANAEROBIC Blood Culture adequate volume   Culture   Final    NO GROWTH 3 DAYS Performed at [facility], [address], [place],  [address]    Report Status PENDING  Incomplete     Labs: Basic Metabolic Panel: Recent Labs  Lab 07/25/19 1730 07/26/19 0505 07/27/19 0546  NA 132* 134* 136  K 4.8 4.4 4.1  CL 101 100 103  CO2 23 25 23   GLUCOSE 123* 97 101*  BUN 21 22 20   CREATININE 1.12 1.08 1.06  CALCIUM 8.9 8.8* 8.6*  MG  --   --  2.0   Liver Function Tests: Recent Labs  Lab 07/25/19 1730 07/26/19 0505  AST 16 13*  ALT 11 10  ALKPHOS 89 77  BILITOT 0.3 0.7  PROT 7.5 6.7  ALBUMIN 3.7 3.1*   CBC: Recent Labs  Lab 07/25/19 1730 07/26/19 0505 07/27/19 0546 07/28/19 0612  WBC 21.7* 21.9* 15.0* 12.2*  NEUTROABS 17.5*  --   --   --   HGB 11.3* 10.5* 9.9* 10.0*  HCT 36.3* 33.8* 31.9* 32.9*  MCV 89.2 89.7 89.4 89.2  PLT 347 299 278 307    Signed:  Barton Dubois MD.  [facility] 07/28/2019, 11:36 AM

## 2019-07-30 LAB — CULTURE, BLOOD (ROUTINE X 2)
Culture: NO GROWTH
Culture: NO GROWTH
Special Requests: ADEQUATE
Special Requests: ADEQUATE

## 2019-08-18 ENCOUNTER — Ambulatory Visit (INDEPENDENT_AMBULATORY_CARE_PROVIDER_SITE_OTHER): Payer: PPO | Admitting: Urology

## 2019-08-18 DIAGNOSIS — N401 Enlarged prostate with lower urinary tract symptoms: Secondary | ICD-10-CM | POA: Diagnosis not present

## 2019-08-18 DIAGNOSIS — N3281 Overactive bladder: Secondary | ICD-10-CM

## 2019-08-27 DIAGNOSIS — A4152 Sepsis due to Pseudomonas: Secondary | ICD-10-CM | POA: Diagnosis not present

## 2019-09-15 ENCOUNTER — Ambulatory Visit (INDEPENDENT_AMBULATORY_CARE_PROVIDER_SITE_OTHER): Payer: PPO | Admitting: Urology

## 2019-09-15 DIAGNOSIS — N3281 Overactive bladder: Secondary | ICD-10-CM | POA: Diagnosis not present

## 2019-09-15 DIAGNOSIS — N401 Enlarged prostate with lower urinary tract symptoms: Secondary | ICD-10-CM | POA: Diagnosis not present

## 2019-09-15 DIAGNOSIS — R3912 Poor urinary stream: Secondary | ICD-10-CM | POA: Diagnosis not present

## 2019-09-23 DIAGNOSIS — Z6827 Body mass index (BMI) 27.0-27.9, adult: Secondary | ICD-10-CM | POA: Diagnosis not present

## 2019-09-23 DIAGNOSIS — Z0001 Encounter for general adult medical examination with abnormal findings: Secondary | ICD-10-CM | POA: Diagnosis not present

## 2019-09-23 DIAGNOSIS — K219 Gastro-esophageal reflux disease without esophagitis: Secondary | ICD-10-CM | POA: Diagnosis not present

## 2019-09-23 DIAGNOSIS — F331 Major depressive disorder, recurrent, moderate: Secondary | ICD-10-CM | POA: Diagnosis not present

## 2019-09-23 DIAGNOSIS — D500 Iron deficiency anemia secondary to blood loss (chronic): Secondary | ICD-10-CM | POA: Diagnosis not present

## 2019-09-23 DIAGNOSIS — Z23 Encounter for immunization: Secondary | ICD-10-CM | POA: Diagnosis not present

## 2019-09-23 DIAGNOSIS — Z1212 Encounter for screening for malignant neoplasm of rectum: Secondary | ICD-10-CM | POA: Diagnosis not present

## 2019-09-23 DIAGNOSIS — E871 Hypo-osmolality and hyponatremia: Secondary | ICD-10-CM | POA: Diagnosis not present

## 2019-09-23 DIAGNOSIS — I10 Essential (primary) hypertension: Secondary | ICD-10-CM | POA: Diagnosis not present

## 2019-09-23 DIAGNOSIS — E039 Hypothyroidism, unspecified: Secondary | ICD-10-CM | POA: Diagnosis not present

## 2019-09-23 DIAGNOSIS — G301 Alzheimer's disease with late onset: Secondary | ICD-10-CM | POA: Diagnosis not present

## 2019-09-23 DIAGNOSIS — N401 Enlarged prostate with lower urinary tract symptoms: Secondary | ICD-10-CM | POA: Diagnosis not present

## 2019-11-05 DIAGNOSIS — E039 Hypothyroidism, unspecified: Secondary | ICD-10-CM | POA: Diagnosis not present

## 2019-11-08 ENCOUNTER — Other Ambulatory Visit: Payer: PPO | Attending: Internal Medicine

## 2019-11-08 ENCOUNTER — Other Ambulatory Visit: Payer: Self-pay

## 2019-11-08 DIAGNOSIS — Z20822 Contact with and (suspected) exposure to covid-19: Secondary | ICD-10-CM | POA: Diagnosis not present

## 2019-11-09 LAB — NOVEL CORONAVIRUS, NAA: SARS-CoV-2, NAA: NOT DETECTED

## 2019-11-12 ENCOUNTER — Telehealth: Payer: Self-pay

## 2019-11-12 NOTE — Telephone Encounter (Signed)
Per pt. Request, faxed COVID test result to [facility].

## 2019-11-12 NOTE — Telephone Encounter (Signed)
Patient needs labs faxed to   [facility] Attn: April Fax: [phone]

## 2019-12-01 DIAGNOSIS — N401 Enlarged prostate with lower urinary tract symptoms: Secondary | ICD-10-CM | POA: Diagnosis not present

## 2019-12-01 DIAGNOSIS — G301 Alzheimer's disease with late onset: Secondary | ICD-10-CM | POA: Diagnosis not present

## 2019-12-01 DIAGNOSIS — F331 Major depressive disorder, recurrent, moderate: Secondary | ICD-10-CM | POA: Diagnosis not present

## 2019-12-01 DIAGNOSIS — I10 Essential (primary) hypertension: Secondary | ICD-10-CM | POA: Diagnosis not present

## 2019-12-01 DIAGNOSIS — D500 Iron deficiency anemia secondary to blood loss (chronic): Secondary | ICD-10-CM | POA: Diagnosis not present

## 2019-12-01 DIAGNOSIS — Z6828 Body mass index (BMI) 28.0-28.9, adult: Secondary | ICD-10-CM | POA: Diagnosis not present

## 2019-12-01 DIAGNOSIS — E871 Hypo-osmolality and hyponatremia: Secondary | ICD-10-CM | POA: Diagnosis not present

## 2019-12-01 DIAGNOSIS — Z0001 Encounter for general adult medical examination with abnormal findings: Secondary | ICD-10-CM | POA: Diagnosis not present

## 2019-12-13 DIAGNOSIS — I10 Essential (primary) hypertension: Secondary | ICD-10-CM | POA: Diagnosis not present

## 2019-12-13 DIAGNOSIS — R1310 Dysphagia, unspecified: Secondary | ICD-10-CM | POA: Diagnosis not present

## 2019-12-13 DIAGNOSIS — F0280 Dementia in other diseases classified elsewhere without behavioral disturbance: Secondary | ICD-10-CM | POA: Diagnosis not present

## 2019-12-13 DIAGNOSIS — Z9181 History of falling: Secondary | ICD-10-CM | POA: Diagnosis not present

## 2019-12-13 DIAGNOSIS — E039 Hypothyroidism, unspecified: Secondary | ICD-10-CM | POA: Diagnosis not present

## 2019-12-13 DIAGNOSIS — M1990 Unspecified osteoarthritis, unspecified site: Secondary | ICD-10-CM | POA: Diagnosis not present

## 2019-12-13 DIAGNOSIS — E871 Hypo-osmolality and hyponatremia: Secondary | ICD-10-CM | POA: Diagnosis not present

## 2019-12-13 DIAGNOSIS — N401 Enlarged prostate with lower urinary tract symptoms: Secondary | ICD-10-CM | POA: Diagnosis not present

## 2019-12-13 DIAGNOSIS — G309 Alzheimer's disease, unspecified: Secondary | ICD-10-CM | POA: Diagnosis not present

## 2019-12-15 ENCOUNTER — Ambulatory Visit (INDEPENDENT_AMBULATORY_CARE_PROVIDER_SITE_OTHER): Payer: PPO | Admitting: Urology

## 2019-12-15 ENCOUNTER — Other Ambulatory Visit: Payer: Self-pay

## 2019-12-15 ENCOUNTER — Encounter: Payer: Self-pay | Admitting: Urology

## 2019-12-15 VITALS — BP 175/84 | HR 80 | Temp 97.0°F | Ht 71.0 in | Wt 195.0 lb

## 2019-12-15 DIAGNOSIS — N320 Bladder-neck obstruction: Secondary | ICD-10-CM

## 2019-12-15 DIAGNOSIS — N3281 Overactive bladder: Secondary | ICD-10-CM | POA: Diagnosis not present

## 2019-12-15 LAB — POCT URINALYSIS DIPSTICK
Bilirubin, UA: NEGATIVE
Glucose, UA: NEGATIVE
Ketones, UA: NEGATIVE
Leukocytes, UA: NEGATIVE
Nitrite, UA: NEGATIVE
Protein, UA: NEGATIVE
Spec Grav, UA: 1.01 (ref 1.010–1.025)
Urobilinogen, UA: 0.2 E.U./dL
pH, UA: 6.5 (ref 5.0–8.0)

## 2019-12-15 LAB — BLADDER SCAN AMB NON-IMAGING: Scan Result: 25.3

## 2019-12-15 NOTE — Patient Instructions (Signed)

## 2019-12-15 NOTE — Progress Notes (Signed)
26

## 2019-12-15 NOTE — Progress Notes (Signed)
12/15/2019 4:31 PM   [name] [birth year] [id number]  Referring provider: [name], MD [place],  [place] [address]  Urinary urgency  HPI: [name] is a 85yo with a hx of bladder neck contracture and OAB here for followup. His nocturia resolved since last visit. He continues to have urge incontinence during the day and goes through 1-3 pullups per day. He currently takes trospium at night and mirabegron 50mg  in the morning.   His records from [facility] are as follows: 10/01/2017: He was previously seen by Michela Pitcher and Dr. Exie Parody and had a TURP several years ago. He continues to have urgency and frequency. He was last seen by Dr. Pilar Jarvis 3 months ago and was started on mirabegron 25mg  which made his nocturia worse. His urgency also worsened.   11/12/2017: He noted no improvement in his urgency and urge incontinence after start uroxatral 10mg  qhs. NO hx of glaucoma   12/24/2017: He noted no change in his urgency and nocturia on toviaz 4mg . He did get severe dry mouth   02/18/2018: He noted no improvement in his urgency since starting sanctura. The frequency has improved on sanctura. Nocturia has improved to 1-2x. He is also limiting fluids within 2 hours of bedtime.   06/10/2018: He is s/p PTNS and noted improvement in his nocturia 1x  07/22/2018: He is currently doing maintenance PTNS for his OAB. He also takes sanctura 60mg  daily. He has stable urgency, frequency and nocturia on sanctura.  11/27/2018: The patient is currently on sanctura and does PTNS with mixed results. He uses 4-5 pads per day.  05/12/2019: He notes worsening urgency and urge incontinence. He is currently on sanctura.  07/28/2019: Patient was admitted with UTI after bladder neck incision and was started on enablex. He has not taken the medication  08/18/2019: He is on sanctura with worsening urgency and urge incontinence  09/15/2019: He LUTS significantly improved with mirabegron 25mg  daily   10/01/2017: He had a  previous TURP 12-13 years ago. He then developed worsening LUTS and frequency and was placed on mirabegron by Dr. Pilar Jarvis and he developed UTI and sepsis and was admitted to [facility]. Currently he has nocturia, urgency, and frequency q 2 hours. He is currently on flomax 0.4mg  daily   11/12/2017: He was started on uroxatral 10mg  after his last visit. He noted no improvement in his urgency and nocturia. He has worsening dizziness with uroxatral.   12/24/2017: He noted no improvement in his LUTS on uroxatral and toviaz.   02/18/2018: He noted improvement in his urinary frequency since starting sanctura.   06/10/2018: He noted improvement in his urgency and nocturia after PTNS. 3 days ago he developed dysuria   07/22/2018: He was prescribed bactrim DS BID at last visit for acute prostatitis. His LUTS significantly improved. He is currently on uroxatral and flomax. He has nocturia 1-2x. He is also on sanctura. If he increases his fluid intake he has nocturia 3-4x.   11/27/2018: He is currently on uroxatral 10mg  QHS, flomax q AM and sanctura. He has nocturia 4-5x. He has urgency and urge incontinence.   05/12/2019: Since last visit he has developed worsening stream, urinary urgency, urge incontinence.   07/28/2019: s/p Incision of bladder. He was admitted for UTI after the procedure.   09/15/2019: LUTS have significantly improved with mirabegron 25mg        PMH: Past Medical History:  Diagnosis Date  . Anxiety   . Arthritis   . BPH (benign  prostatic hyperplasia)   . COPD (chronic obstructive pulmonary disease) ([place])   . Depression   . GERD (gastroesophageal reflux disease)   . HOH (hard of hearing)   . Hypertension   . PONV (postoperative nausea and vomiting)     Surgical History: Past Surgical History:  Procedure Laterality Date  . COMPRESSION HIP SCREW     [facility]  . CYSTOSCOPY WITH INJECTION N/A 07/19/2019   Procedure: CYSTOSCOPY WITH INJECTION OF MITOMYCIN;  Surgeon: Cleon Gustin, MD;  Location: [facility];  Service: Urology;  Laterality: N/A;  . HERNIA REPAIR     [facility]  . PARTIAL HIP ARTHROPLASTY     [facility]  . REVISION TOTAL HIP ARTHROPLASTY     [facility]  . TRANSURETHRAL INCISION OF BLADDER NECK N/A 07/19/2019   Procedure: TRANSURETHRAL INCISION OF BLADDER NECK;  Surgeon: Cleon Gustin, MD;  Location: [facility];  Service: Urology;  Laterality: N/A;  . TRANSURETHRAL RESECTION OF PROSTATE  07/09/2011   Procedure: TRANSURETHRAL RESECTION OF THE PROSTATE (TURP);  Surgeon: Marissa Nestle;  Location: [facility];  Service: Urology;  Laterality: N/A;    Home Medications:  Allergies as of 12/15/2019      Reactions   Pneumococcal Vaccines Rash      Medication List       Accurate as of December 15, 2019  4:31 PM. If you have any questions, ask your nurse or doctor.        acetaminophen 650 MG CR tablet Commonly known as: TYLENOL Take 650 mg by mouth every 8 (eight) hours as needed for pain.   alfuzosin 10 MG 24 hr tablet Commonly known as: UROXATRAL Take 10 mg by mouth at bedtime.   amLODipine 5 MG tablet Commonly known as: NORVASC Take 5 mg by mouth every morning. for high blood pressure   aspirin EC 81 MG tablet Take 81 mg by mouth every Monday, Wednesday, and Friday. At night.   darifenacin 7.5 MG 24 hr tablet Commonly known as: ENABLEX Take 1 tablet (7.5 mg total) by mouth daily.   divalproex 125 MG DR tablet Commonly known as: DEPAKOTE Take 125 mg by mouth at bedtime.   donepezil 5 MG tablet Commonly known as: ARICEPT Take 5 mg by mouth at bedtime.   Fish Oil 1000 MG Caps Take 1,000 mg by mouth every other day.   Folate 400 MCG tablet Generic drug: folic acid Take 1 tablet by mouth at bedtime.   gabapentin 600 MG tablet Commonly known as: NEURONTIN Take 600 mg by mouth 3 (three) times daily.   HYDROcodone-acetaminophen 5-325 MG tablet Commonly known as: Norco Take 1 tablet by mouth every 4 (four) hours as needed for moderate  pain. What changed: when to take this   hydrOXYzine 10 MG tablet Commonly known as: ATARAX/VISTARIL Take 10 mg by mouth every 6 (six) hours as needed for anxiety.   levothyroxine 137 MCG tablet Commonly known as: SYNTHROID Take 137 mcg by mouth daily before breakfast.   memantine 5 MG tablet Commonly known as: NAMENDA Take 5 mg by mouth 2 (two) times daily.   multivitamins ther. w/minerals Tabs tablet Take 1 tablet by mouth daily.   OLANZapine 15 MG tablet Commonly known as: ZYPREXA Take 15 mg by mouth at bedtime.   omeprazole 20 MG capsule Commonly known as: PRILOSEC Take 20 mg by mouth 2 (two) times daily before a meal.   polyethylene glycol powder 17 GM/SCOOP powder Commonly known as: GLYCOLAX/MIRALAX Take 17 g by mouth  daily.   sertraline 25 MG tablet Commonly known as: ZOLOFT Take 25 mg by mouth at bedtime.   tamsulosin 0.4 MG Caps capsule Commonly known as: FLOMAX Take 0.4 mg by mouth every morning.   Trospium Chloride 60 MG Cp24 Take 60 mg by mouth every morning.   Vitamin D3 50 MCG (2000 UT) Tabs Take 2,000 Units by mouth daily.       Allergies:  Allergies  Allergen Reactions  . Pneumococcal Vaccines Rash    Family History: Family History  Problem Relation Age of Onset  . Anesthesia problems Neg Hx   . Hypotension Neg Hx   . Malignant hyperthermia Neg Hx   . Pseudochol deficiency Neg Hx     Social History:  reports that he quit smoking about 28 years ago. His smoking use included cigarettes. He has a 12.50 pack-year smoking history. He has never used smokeless tobacco. He reports that he does not drink alcohol or use drugs.  ROS: All other review of systems were reviewed and are negative except what is noted above in HPI  Physical Exam: Temp (!) 97 F (36.1 C)   Constitutional:  Alert and oriented, No acute distress. HEENT: [place] AT, moist mucus membranes.  Trachea midline, no masses. Cardiovascular: No clubbing, cyanosis, or  edema. Respiratory: Normal respiratory effort, no increased work of breathing. GI: Abdomen is soft, nontender, nondistended, no abdominal masses GU: No CVA tenderness Lymph: No cervical or inguinal lymphadenopathy. Skin: No rashes, bruises or suspicious lesions. Neurologic: Grossly intact, no focal deficits, moving all 4 extremities. Psychiatric: Normal mood and affect.  Laboratory Data: Lab Results  Component Value Date   WBC 12.2 (H) 07/28/2019   HGB 10.0 (L) 07/28/2019   HCT 32.9 (L) 07/28/2019   MCV 89.2 07/28/2019   PLT 307 07/28/2019    Lab Results  Component Value Date   CREATININE 1.06 07/27/2019    No results found for: PSA  No results found for: TESTOSTERONE  No results found for: HGBA1C  Urinalysis    Component Value Date/Time   COLORURINE YELLOW 07/25/2019 1730   APPEARANCEUR HAZY (A) 07/25/2019 1730   LABSPEC 1.013 07/25/2019 1730   PHURINE 8.0 07/25/2019 1730   GLUCOSEU NEGATIVE 07/25/2019 1730   HGBUR LARGE (A) 07/25/2019 1730   BILIRUBINUR NEGATIVE 07/25/2019 1730   KETONESUR NEGATIVE 07/25/2019 1730   PROTEINUR 100 (A) 07/25/2019 1730   UROBILINOGEN 0.2 12/06/2012 0812   NITRITE POSITIVE (A) 07/25/2019 1730   LEUKOCYTESUR LARGE (A) 07/25/2019 1730    Lab Results  Component Value Date   BACTERIA NONE SEEN 07/25/2019    Pertinent Imaging:  No results found for this or any previous visit. No results found for this or any previous visit. No results found for this or any previous visit. No results found for this or any previous visit. No results found for this or any previous visit. No results found for this or any previous visit. No results found for this or any previous visit. No results found for this or any previous visit.  Assessment & Plan:    1. Bladder outlet obstruction -continue uroxatral 10mg  - POCT urinalysis dipstick  2. OAB (overactive bladder) -switch tropsium to AM and mirabegron to PM. -We discussed intravesical botox  and we have agreed not to proceed with this therapy   No follow-ups on file.  Nicolette Bang, MD  [facility] [place]

## 2019-12-16 DIAGNOSIS — M9903 Segmental and somatic dysfunction of lumbar region: Secondary | ICD-10-CM | POA: Diagnosis not present

## 2019-12-16 DIAGNOSIS — M47816 Spondylosis without myelopathy or radiculopathy, lumbar region: Secondary | ICD-10-CM | POA: Diagnosis not present

## 2019-12-17 DIAGNOSIS — E039 Hypothyroidism, unspecified: Secondary | ICD-10-CM | POA: Diagnosis not present

## 2019-12-23 ENCOUNTER — Other Ambulatory Visit: Payer: Self-pay

## 2019-12-23 ENCOUNTER — Telehealth: Payer: Self-pay | Admitting: Urology

## 2019-12-23 DIAGNOSIS — N320 Bladder-neck obstruction: Secondary | ICD-10-CM

## 2019-12-23 MED ORDER — TROSPIUM CHLORIDE ER 60 MG PO CP24: 60.0000 mg | ORAL_CAPSULE | Freq: Every morning | ORAL | 11 refills | Status: DC

## 2019-12-23 NOTE — Telephone Encounter (Signed)
Pt wife called and stated he needs a refill on Trospium, does not have enough to get him through the weekend.

## 2019-12-27 DIAGNOSIS — M47816 Spondylosis without myelopathy or radiculopathy, lumbar region: Secondary | ICD-10-CM | POA: Diagnosis not present

## 2019-12-27 DIAGNOSIS — M9903 Segmental and somatic dysfunction of lumbar region: Secondary | ICD-10-CM | POA: Diagnosis not present

## 2019-12-30 DIAGNOSIS — M47816 Spondylosis without myelopathy or radiculopathy, lumbar region: Secondary | ICD-10-CM | POA: Diagnosis not present

## 2019-12-30 DIAGNOSIS — M9903 Segmental and somatic dysfunction of lumbar region: Secondary | ICD-10-CM | POA: Diagnosis not present

## 2020-01-03 DIAGNOSIS — M9903 Segmental and somatic dysfunction of lumbar region: Secondary | ICD-10-CM | POA: Diagnosis not present

## 2020-01-03 DIAGNOSIS — M47816 Spondylosis without myelopathy or radiculopathy, lumbar region: Secondary | ICD-10-CM | POA: Diagnosis not present

## 2020-01-06 DIAGNOSIS — M9903 Segmental and somatic dysfunction of lumbar region: Secondary | ICD-10-CM | POA: Diagnosis not present

## 2020-01-06 DIAGNOSIS — M47816 Spondylosis without myelopathy or radiculopathy, lumbar region: Secondary | ICD-10-CM | POA: Diagnosis not present

## 2020-01-10 DIAGNOSIS — M47816 Spondylosis without myelopathy or radiculopathy, lumbar region: Secondary | ICD-10-CM | POA: Diagnosis not present

## 2020-01-10 DIAGNOSIS — M9903 Segmental and somatic dysfunction of lumbar region: Secondary | ICD-10-CM | POA: Diagnosis not present

## 2020-01-12 DIAGNOSIS — Z9181 History of falling: Secondary | ICD-10-CM | POA: Diagnosis not present

## 2020-01-12 DIAGNOSIS — I10 Essential (primary) hypertension: Secondary | ICD-10-CM | POA: Diagnosis not present

## 2020-01-12 DIAGNOSIS — R1310 Dysphagia, unspecified: Secondary | ICD-10-CM | POA: Diagnosis not present

## 2020-01-12 DIAGNOSIS — E871 Hypo-osmolality and hyponatremia: Secondary | ICD-10-CM | POA: Diagnosis not present

## 2020-01-12 DIAGNOSIS — G309 Alzheimer's disease, unspecified: Secondary | ICD-10-CM | POA: Diagnosis not present

## 2020-01-12 DIAGNOSIS — F0280 Dementia in other diseases classified elsewhere without behavioral disturbance: Secondary | ICD-10-CM | POA: Diagnosis not present

## 2020-01-12 DIAGNOSIS — E039 Hypothyroidism, unspecified: Secondary | ICD-10-CM | POA: Diagnosis not present

## 2020-01-12 DIAGNOSIS — M1990 Unspecified osteoarthritis, unspecified site: Secondary | ICD-10-CM | POA: Diagnosis not present

## 2020-01-12 DIAGNOSIS — N401 Enlarged prostate with lower urinary tract symptoms: Secondary | ICD-10-CM | POA: Diagnosis not present

## 2020-01-17 DIAGNOSIS — Z20828 Contact with and (suspected) exposure to other viral communicable diseases: Secondary | ICD-10-CM | POA: Diagnosis not present

## 2020-01-17 DIAGNOSIS — J329 Chronic sinusitis, unspecified: Secondary | ICD-10-CM | POA: Diagnosis not present

## 2020-01-17 DIAGNOSIS — J441 Chronic obstructive pulmonary disease with (acute) exacerbation: Secondary | ICD-10-CM | POA: Diagnosis not present

## 2020-01-24 DIAGNOSIS — G309 Alzheimer's disease, unspecified: Secondary | ICD-10-CM | POA: Diagnosis not present

## 2020-01-24 DIAGNOSIS — I10 Essential (primary) hypertension: Secondary | ICD-10-CM | POA: Diagnosis not present

## 2020-01-24 DIAGNOSIS — N401 Enlarged prostate with lower urinary tract symptoms: Secondary | ICD-10-CM | POA: Diagnosis not present

## 2020-01-24 DIAGNOSIS — E039 Hypothyroidism, unspecified: Secondary | ICD-10-CM | POA: Diagnosis not present

## 2020-01-24 DIAGNOSIS — E871 Hypo-osmolality and hyponatremia: Secondary | ICD-10-CM | POA: Diagnosis not present

## 2020-01-24 DIAGNOSIS — M1990 Unspecified osteoarthritis, unspecified site: Secondary | ICD-10-CM | POA: Diagnosis not present

## 2020-01-24 DIAGNOSIS — R1310 Dysphagia, unspecified: Secondary | ICD-10-CM | POA: Diagnosis not present

## 2020-01-24 DIAGNOSIS — F0280 Dementia in other diseases classified elsewhere without behavioral disturbance: Secondary | ICD-10-CM | POA: Diagnosis not present

## 2020-01-28 DIAGNOSIS — E039 Hypothyroidism, unspecified: Secondary | ICD-10-CM | POA: Diagnosis not present

## 2020-03-15 ENCOUNTER — Encounter: Payer: Self-pay | Admitting: Urology

## 2020-03-15 ENCOUNTER — Ambulatory Visit (INDEPENDENT_AMBULATORY_CARE_PROVIDER_SITE_OTHER): Payer: PPO | Admitting: Urology

## 2020-03-15 ENCOUNTER — Other Ambulatory Visit: Payer: Self-pay

## 2020-03-15 VITALS — BP 134/75 | HR 69 | Temp 97.9°F | Ht 72.0 in | Wt 191.0 lb

## 2020-03-15 DIAGNOSIS — N3281 Overactive bladder: Secondary | ICD-10-CM | POA: Diagnosis not present

## 2020-03-15 DIAGNOSIS — N320 Bladder-neck obstruction: Secondary | ICD-10-CM | POA: Diagnosis not present

## 2020-03-15 LAB — POCT URINALYSIS DIPSTICK
Bilirubin, UA: NEGATIVE
Blood, UA: NEGATIVE
Glucose, UA: NEGATIVE
Ketones, UA: NEGATIVE
Nitrite, UA: NEGATIVE
Protein, UA: NEGATIVE
Spec Grav, UA: <=1.005 — AB (ref 1.010–1.025)
Urobilinogen, UA: 0.2 E.U./dL
pH, UA: 6 (ref 5.0–8.0)

## 2020-03-15 LAB — BLADDER SCAN AMB NON-IMAGING: Scan Result: 34.3

## 2020-03-15 MED ORDER — ALFUZOSIN HCL ER 10 MG PO TB24: 10.0000 mg | ORAL_TABLET | Freq: Every day | ORAL | 3 refills | Status: AC

## 2020-03-15 MED ORDER — TROSPIUM CHLORIDE ER 60 MG PO CP24: 60.0000 mg | ORAL_CAPSULE | Freq: Every morning | ORAL | 11 refills | Status: DC

## 2020-03-15 MED ORDER — MYRBETRIQ 50 MG PO TB24: 50.0000 mg | ORAL_TABLET | Freq: Every day | ORAL | 11 refills | Status: DC

## 2020-03-15 NOTE — Progress Notes (Signed)
Urological Symptom Review  Patient is experiencing the following symptoms: Frequent urination   Review of Systems  Gastrointestinal (upper)  : Negative for upper GI symptoms  Gastrointestinal (lower) : Constipation  Constitutional : Negative for symptoms  Skin: Negative for skin symptoms  Eyes: None  Ear/Nose/Throat : Negative for Ear/Nose/Throat symptoms  Hematologic/Lymphatic: Negative for Hematologic/Lymphatic symptoms  Cardiovascular : Negative for cardiovascular symptoms  Respiratory : Negative for respiratory symptoms  Endocrine: Negative for endocrine symptoms  Musculoskeletal: Negative for musculoskeletal symptoms  Neurological: Negative for neurological symptoms  Psychologic: Negative for psychiatric symptoms

## 2020-03-15 NOTE — Progress Notes (Signed)
03/15/2020 4:03 PM   Christian Cella Sr. 24-Apr-1935 [id number]  Referring provider: [name], MD [place],  [place] [id number]  OAB  HPI: [name] is a 85yo here for followup for OAB and bladder neck obstruction. Last visit we switch mirabegon to AM and trospium to PM. He uses 2 pullups per day. Stream strong. No urinary frequency. Nocturia 1-2x. He has constipation with tropsium.     PMH: Past Medical History:  Diagnosis Date  . Anxiety   . Arthritis   . BPH (benign prostatic hyperplasia)   . COPD (chronic obstructive pulmonary disease) ([place])   . Depression   . GERD (gastroesophageal reflux disease)   . HOH (hard of hearing)   . Hypertension   . PONV (postoperative nausea and vomiting)     Surgical History: Past Surgical History:  Procedure Laterality Date  . COMPRESSION HIP SCREW     [facility]  . CYSTOSCOPY WITH INJECTION N/A 07/19/2019   Procedure: CYSTOSCOPY WITH INJECTION OF MITOMYCIN;  Surgeon: Christian Gustin, MD;  Location: [facility];  Service: Urology;  Laterality: N/A;  . HERNIA REPAIR     [facility]  . PARTIAL HIP ARTHROPLASTY     [facility]  . REVISION TOTAL HIP ARTHROPLASTY     [facility]  . TRANSURETHRAL INCISION OF BLADDER NECK N/A 07/19/2019   Procedure: TRANSURETHRAL INCISION OF BLADDER NECK;  Surgeon: Christian Gustin, MD;  Location: [facility];  Service: Urology;  Laterality: N/A;  . TRANSURETHRAL RESECTION OF PROSTATE  07/09/2011   Procedure: TRANSURETHRAL RESECTION OF THE PROSTATE (TURP);  Surgeon: Christian Miller;  Location: [facility];  Service: Urology;  Laterality: N/A;    Home Medications:  Allergies as of 03/15/2020      Reactions   Pneumococcal Vaccines Rash      Medication List       Accurate as of March 15, 2020  4:03 PM. If you have any questions, ask your nurse or doctor.        acetaminophen 650 MG CR tablet Commonly known as: TYLENOL Take 650 mg by mouth every 8 (eight) hours as needed for pain.   alfuzosin 10 MG 24 hr  tablet Commonly known as: UROXATRAL Take 10 mg by mouth at bedtime.   amLODipine 5 MG tablet Commonly known as: NORVASC Take 5 mg by mouth every morning. for high blood pressure   amLODipine 2.5 MG tablet Commonly known as: NORVASC Take 2.5 mg by mouth daily.   aspirin EC 81 MG tablet Take 81 mg by mouth every Monday, Wednesday, and Friday. At night.   darifenacin 7.5 MG 24 hr tablet Commonly known as: ENABLEX Take 1 tablet (7.5 mg total) by mouth daily.   divalproex 125 MG DR tablet Commonly known as: DEPAKOTE Take 125 mg by mouth at bedtime.   donepezil 5 MG tablet Commonly known as: ARICEPT Take 5 mg by mouth at bedtime.   Fish Oil 1000 MG Caps Take 1,000 mg by mouth every other day.   fluticasone 50 MCG/ACT nasal spray Commonly known as: FLONASE Place 2 sprays into both nostrils daily.   Folate 400 MCG tablet Generic drug: folic acid Take 1 tablet by mouth at bedtime.   gabapentin 600 MG tablet Commonly known as: NEURONTIN Take 600 mg by mouth 3 (three) times daily.   HYDROcodone-acetaminophen 5-325 MG tablet Commonly known as: Norco Take 1 tablet by mouth every 4 (four) hours as needed for moderate pain. What changed: when to take  this   hydrOXYzine 10 MG tablet Commonly known as: ATARAX/VISTARIL Take 10 mg by mouth every 6 (six) hours as needed for anxiety.   levothyroxine 112 MCG tablet Commonly known as: SYNTHROID Take 112 mcg by mouth every morning.   levothyroxine 75 MCG tablet Commonly known as: SYNTHROID Take 75 mcg by mouth every morning.   levothyroxine 137 MCG tablet Commonly known as: SYNTHROID Take 137 mcg by mouth daily before breakfast.   memantine 5 MG tablet Commonly known as: NAMENDA Take 5 mg by mouth 2 (two) times daily.   multivitamins ther. w/minerals Tabs tablet Take 1 tablet by mouth daily.   Myrbetriq 50 MG Tb24 tablet Generic drug: mirabegron ER Take 50 mg by mouth daily.   nystatin cream Commonly known as:  MYCOSTATIN   OLANZapine 15 MG tablet Commonly known as: ZYPREXA Take 15 mg by mouth at bedtime.   omeprazole 20 MG capsule Commonly known as: PRILOSEC Take 20 mg by mouth 2 (two) times daily before a meal.   polyethylene glycol powder 17 GM/SCOOP powder Commonly known as: GLYCOLAX/MIRALAX Take 17 g by mouth daily.   sertraline 25 MG tablet Commonly known as: ZOLOFT Take 25 mg by mouth at bedtime.   sertraline 100 MG tablet Commonly known as: ZOLOFT Take 200 mg by mouth daily.   tamsulosin 0.4 MG Caps capsule Commonly known as: FLOMAX Take 0.4 mg by mouth every morning.   Trospium Chloride 60 MG Cp24 Take 1 capsule (60 mg total) by mouth every morning.   Vitamin D3 50 MCG (2000 UT) Tabs Take 2,000 Units by mouth daily.       Allergies:  Allergies  Allergen Reactions  . Pneumococcal Vaccines Rash    Family History: Family History  Problem Relation Age of Onset  . Anesthesia problems Neg Hx   . Hypotension Neg Hx   . Malignant hyperthermia Neg Hx   . Pseudochol deficiency Neg Hx     Social History:  reports that he quit smoking about 28 years ago. His smoking use included cigarettes. He has a 12.50 pack-year smoking history. He has never used smokeless tobacco. He reports that he does not drink alcohol or use drugs.  ROS: All other review of systems were reviewed and are negative except what is noted above in HPI  Physical Exam: BP 134/75   Pulse 69   Temp 97.9 F (36.6 C)   Ht 6' (1.829 m)   Wt 191 lb (86.6 kg)   BMI 25.90 kg/m   Constitutional:  Alert and oriented, No acute distress. HEENT: [place] AT, moist mucus membranes.  Trachea midline, no masses. Cardiovascular: No clubbing, cyanosis, or edema. Respiratory: Normal respiratory effort, no increased work of breathing. GI: Abdomen is soft, nontender, nondistended, no abdominal masses GU: No CVA tenderness.  Lymph: No cervical or inguinal lymphadenopathy. Skin: No rashes, bruises or suspicious  lesions. Neurologic: Grossly intact, no focal deficits, moving all 4 extremities. Psychiatric: Normal mood and affect.  Laboratory Data: Lab Results  Component Value Date   WBC 12.2 (H) 07/28/2019   HGB 10.0 (L) 07/28/2019   HCT 32.9 (L) 07/28/2019   MCV 89.2 07/28/2019   PLT 307 07/28/2019    Lab Results  Component Value Date   CREATININE 1.06 07/27/2019    No results found for: PSA  No results found for: TESTOSTERONE  No results found for: HGBA1C  Urinalysis    Component Value Date/Time   COLORURINE YELLOW 07/25/2019 1730   APPEARANCEUR HAZY (A) 07/25/2019 1730  LABSPEC 1.013 07/25/2019 1730   PHURINE 8.0 07/25/2019 1730   GLUCOSEU NEGATIVE 07/25/2019 1730   HGBUR LARGE (A) 07/25/2019 1730   BILIRUBINUR neg 12/15/2019 1630   KETONESUR NEGATIVE 07/25/2019 1730   PROTEINUR Negative 12/15/2019 1630   PROTEINUR 100 (A) 07/25/2019 1730   UROBILINOGEN 0.2 12/15/2019 1630   UROBILINOGEN 0.2 12/06/2012 0812   NITRITE neg 12/15/2019 1630   NITRITE POSITIVE (A) 07/25/2019 1730   LEUKOCYTESUR Negative 12/15/2019 1630   LEUKOCYTESUR LARGE (A) 07/25/2019 1730    Lab Results  Component Value Date   BACTERIA NONE SEEN 07/25/2019    Pertinent Imaging:  No results found for this or any previous visit. No results found for this or any previous visit. No results found for this or any previous visit. No results found for this or any previous visit. No results found for this or any previous visit. No results found for this or any previous visit. No results found for this or any previous visit. No results found for this or any previous visit.  Assessment & Plan:    1. OAB (overactive bladder) -continue mirabegron in AM and trospium  - BLADDER SCAN AMB NON-IMAGING - POCT urinalysis dipstick  2. Bladder outlet obstruction Continue uroxatral 10mg    No follow-ups on file.  Nicolette Bang, MD  [facility] [place]

## 2020-03-15 NOTE — Patient Instructions (Signed)

## 2020-05-12 DIAGNOSIS — K2100 Gastro-esophageal reflux disease with esophagitis, without bleeding: Secondary | ICD-10-CM | POA: Diagnosis not present

## 2020-05-12 DIAGNOSIS — E782 Mixed hyperlipidemia: Secondary | ICD-10-CM | POA: Diagnosis not present

## 2020-05-12 DIAGNOSIS — I10 Essential (primary) hypertension: Secondary | ICD-10-CM | POA: Diagnosis not present

## 2020-05-12 DIAGNOSIS — E039 Hypothyroidism, unspecified: Secondary | ICD-10-CM | POA: Diagnosis not present

## 2020-05-12 DIAGNOSIS — E875 Hyperkalemia: Secondary | ICD-10-CM | POA: Diagnosis not present

## 2020-05-12 DIAGNOSIS — E871 Hypo-osmolality and hyponatremia: Secondary | ICD-10-CM | POA: Diagnosis not present

## 2020-05-19 DIAGNOSIS — I10 Essential (primary) hypertension: Secondary | ICD-10-CM | POA: Diagnosis not present

## 2020-05-19 DIAGNOSIS — K2100 Gastro-esophageal reflux disease with esophagitis, without bleeding: Secondary | ICD-10-CM | POA: Diagnosis not present

## 2020-05-19 DIAGNOSIS — N401 Enlarged prostate with lower urinary tract symptoms: Secondary | ICD-10-CM | POA: Diagnosis not present

## 2020-05-19 DIAGNOSIS — G301 Alzheimer's disease with late onset: Secondary | ICD-10-CM | POA: Diagnosis not present

## 2020-05-19 DIAGNOSIS — E871 Hypo-osmolality and hyponatremia: Secondary | ICD-10-CM | POA: Diagnosis not present

## 2020-05-19 DIAGNOSIS — E039 Hypothyroidism, unspecified: Secondary | ICD-10-CM | POA: Diagnosis not present

## 2020-05-19 DIAGNOSIS — F331 Major depressive disorder, recurrent, moderate: Secondary | ICD-10-CM | POA: Diagnosis not present

## 2020-05-19 DIAGNOSIS — R300 Dysuria: Secondary | ICD-10-CM | POA: Diagnosis not present

## 2020-05-19 DIAGNOSIS — Z23 Encounter for immunization: Secondary | ICD-10-CM | POA: Diagnosis not present

## 2020-05-19 DIAGNOSIS — D500 Iron deficiency anemia secondary to blood loss (chronic): Secondary | ICD-10-CM | POA: Diagnosis not present

## 2020-05-19 DIAGNOSIS — Z6828 Body mass index (BMI) 28.0-28.9, adult: Secondary | ICD-10-CM | POA: Diagnosis not present

## 2020-05-19 DIAGNOSIS — Z1212 Encounter for screening for malignant neoplasm of rectum: Secondary | ICD-10-CM | POA: Diagnosis not present

## 2020-05-20 DIAGNOSIS — S72001A Fracture of unspecified part of neck of right femur, initial encounter for closed fracture: Secondary | ICD-10-CM | POA: Diagnosis not present

## 2020-05-20 DIAGNOSIS — J9811 Atelectasis: Secondary | ICD-10-CM | POA: Diagnosis not present

## 2020-05-20 DIAGNOSIS — M25551 Pain in right hip: Secondary | ICD-10-CM | POA: Diagnosis not present

## 2020-05-20 DIAGNOSIS — W1830XA Fall on same level, unspecified, initial encounter: Secondary | ICD-10-CM | POA: Diagnosis not present

## 2020-05-20 DIAGNOSIS — Z01818 Encounter for other preprocedural examination: Secondary | ICD-10-CM | POA: Diagnosis not present

## 2020-05-20 DIAGNOSIS — J90 Pleural effusion, not elsewhere classified: Secondary | ICD-10-CM | POA: Diagnosis not present

## 2020-05-20 DIAGNOSIS — Z20822 Contact with and (suspected) exposure to covid-19: Secondary | ICD-10-CM | POA: Diagnosis not present

## 2020-05-21 ENCOUNTER — Inpatient Hospital Stay (HOSPITAL_COMMUNITY)
Admission: AD | Admit: 2020-05-21 | Discharge: 2020-05-30 | DRG: 521 | Disposition: A | Discharge status: Skilled Nursing Facility | Payer: PPO | Source: Other Acute Inpatient Hospital | Attending: Internal Medicine | Admitting: Internal Medicine

## 2020-05-21 ENCOUNTER — Other Ambulatory Visit: Payer: Self-pay

## 2020-05-21 ENCOUNTER — Inpatient Hospital Stay (HOSPITAL_COMMUNITY): Payer: PPO

## 2020-05-21 ENCOUNTER — Encounter (HOSPITAL_COMMUNITY): Payer: Self-pay | Admitting: Internal Medicine

## 2020-05-21 DIAGNOSIS — Z471 Aftercare following joint replacement surgery: Secondary | ICD-10-CM | POA: Diagnosis not present

## 2020-05-21 DIAGNOSIS — G319 Degenerative disease of nervous system, unspecified: Secondary | ICD-10-CM | POA: Diagnosis not present

## 2020-05-21 DIAGNOSIS — Z87891 Personal history of nicotine dependence: Secondary | ICD-10-CM

## 2020-05-21 DIAGNOSIS — Z79890 Hormone replacement therapy (postmenopausal): Secondary | ICD-10-CM

## 2020-05-21 DIAGNOSIS — Z887 Allergy status to serum and vaccine status: Secondary | ICD-10-CM

## 2020-05-21 DIAGNOSIS — F331 Major depressive disorder, recurrent, moderate: Secondary | ICD-10-CM | POA: Diagnosis not present

## 2020-05-21 DIAGNOSIS — S72001D Fracture of unspecified part of neck of right femur, subsequent encounter for closed fracture with routine healing: Secondary | ICD-10-CM | POA: Diagnosis not present

## 2020-05-21 DIAGNOSIS — J90 Pleural effusion, not elsewhere classified: Secondary | ICD-10-CM | POA: Diagnosis not present

## 2020-05-21 DIAGNOSIS — R410 Disorientation, unspecified: Secondary | ICD-10-CM | POA: Diagnosis not present

## 2020-05-21 DIAGNOSIS — N179 Acute kidney failure, unspecified: Secondary | ICD-10-CM | POA: Diagnosis not present

## 2020-05-21 DIAGNOSIS — E878 Other disorders of electrolyte and fluid balance, not elsewhere classified: Secondary | ICD-10-CM | POA: Diagnosis present

## 2020-05-21 DIAGNOSIS — J449 Chronic obstructive pulmonary disease, unspecified: Secondary | ICD-10-CM | POA: Diagnosis present

## 2020-05-21 DIAGNOSIS — L89156 Pressure-induced deep tissue damage of sacral region: Secondary | ICD-10-CM | POA: Diagnosis not present

## 2020-05-21 DIAGNOSIS — M2550 Pain in unspecified joint: Secondary | ICD-10-CM | POA: Diagnosis not present

## 2020-05-21 DIAGNOSIS — F0391 Unspecified dementia with behavioral disturbance: Secondary | ICD-10-CM | POA: Diagnosis present

## 2020-05-21 DIAGNOSIS — Z7982 Long term (current) use of aspirin: Secondary | ICD-10-CM | POA: Diagnosis not present

## 2020-05-21 DIAGNOSIS — M1611 Unilateral primary osteoarthritis, right hip: Secondary | ICD-10-CM | POA: Diagnosis present

## 2020-05-21 DIAGNOSIS — Z79899 Other long term (current) drug therapy: Secondary | ICD-10-CM | POA: Diagnosis not present

## 2020-05-21 DIAGNOSIS — G629 Polyneuropathy, unspecified: Secondary | ICD-10-CM | POA: Diagnosis not present

## 2020-05-21 DIAGNOSIS — F418 Other specified anxiety disorders: Secondary | ICD-10-CM | POA: Diagnosis not present

## 2020-05-21 DIAGNOSIS — F329 Major depressive disorder, single episode, unspecified: Secondary | ICD-10-CM | POA: Diagnosis not present

## 2020-05-21 DIAGNOSIS — J9811 Atelectasis: Secondary | ICD-10-CM | POA: Diagnosis not present

## 2020-05-21 DIAGNOSIS — N401 Enlarged prostate with lower urinary tract symptoms: Secondary | ICD-10-CM | POA: Diagnosis present

## 2020-05-21 DIAGNOSIS — F0390 Unspecified dementia without behavioral disturbance: Secondary | ICD-10-CM | POA: Diagnosis present

## 2020-05-21 DIAGNOSIS — M25551 Pain in right hip: Secondary | ICD-10-CM | POA: Diagnosis not present

## 2020-05-21 DIAGNOSIS — S72011A Unspecified intracapsular fracture of right femur, initial encounter for closed fracture: Principal | ICD-10-CM | POA: Diagnosis present

## 2020-05-21 DIAGNOSIS — Z01818 Encounter for other preprocedural examination: Secondary | ICD-10-CM | POA: Diagnosis not present

## 2020-05-21 DIAGNOSIS — Z96641 Presence of right artificial hip joint: Secondary | ICD-10-CM

## 2020-05-21 DIAGNOSIS — I469 Cardiac arrest, cause unspecified: Secondary | ICD-10-CM | POA: Diagnosis not present

## 2020-05-21 DIAGNOSIS — I6782 Cerebral ischemia: Secondary | ICD-10-CM | POA: Diagnosis not present

## 2020-05-21 DIAGNOSIS — Z8249 Family history of ischemic heart disease and other diseases of the circulatory system: Secondary | ICD-10-CM

## 2020-05-21 DIAGNOSIS — F17200 Nicotine dependence, unspecified, uncomplicated: Secondary | ICD-10-CM | POA: Diagnosis not present

## 2020-05-21 DIAGNOSIS — Z20822 Contact with and (suspected) exposure to covid-19: Secondary | ICD-10-CM | POA: Diagnosis present

## 2020-05-21 DIAGNOSIS — I440 Atrioventricular block, first degree: Secondary | ICD-10-CM | POA: Diagnosis not present

## 2020-05-21 DIAGNOSIS — J302 Other seasonal allergic rhinitis: Secondary | ICD-10-CM | POA: Diagnosis not present

## 2020-05-21 DIAGNOSIS — S72001A Fracture of unspecified part of neck of right femur, initial encounter for closed fracture: Secondary | ICD-10-CM | POA: Diagnosis present

## 2020-05-21 DIAGNOSIS — E871 Hypo-osmolality and hyponatremia: Secondary | ICD-10-CM | POA: Diagnosis present

## 2020-05-21 DIAGNOSIS — R262 Difficulty in walking, not elsewhere classified: Secondary | ICD-10-CM | POA: Diagnosis not present

## 2020-05-21 DIAGNOSIS — H9190 Unspecified hearing loss, unspecified ear: Secondary | ICD-10-CM | POA: Diagnosis present

## 2020-05-21 DIAGNOSIS — L89321 Pressure ulcer of left buttock, stage 1: Secondary | ICD-10-CM | POA: Diagnosis not present

## 2020-05-21 DIAGNOSIS — M6281 Muscle weakness (generalized): Secondary | ICD-10-CM | POA: Diagnosis not present

## 2020-05-21 DIAGNOSIS — M47816 Spondylosis without myelopathy or radiculopathy, lumbar region: Secondary | ICD-10-CM | POA: Diagnosis not present

## 2020-05-21 DIAGNOSIS — I10 Essential (primary) hypertension: Secondary | ICD-10-CM | POA: Diagnosis present

## 2020-05-21 DIAGNOSIS — Z96642 Presence of left artificial hip joint: Secondary | ICD-10-CM | POA: Diagnosis not present

## 2020-05-21 DIAGNOSIS — F419 Anxiety disorder, unspecified: Secondary | ICD-10-CM | POA: Diagnosis present

## 2020-05-21 DIAGNOSIS — Z9889 Other specified postprocedural states: Secondary | ICD-10-CM

## 2020-05-21 DIAGNOSIS — N320 Bladder-neck obstruction: Secondary | ICD-10-CM | POA: Diagnosis not present

## 2020-05-21 DIAGNOSIS — R4182 Altered mental status, unspecified: Secondary | ICD-10-CM | POA: Diagnosis not present

## 2020-05-21 DIAGNOSIS — K219 Gastro-esophageal reflux disease without esophagitis: Secondary | ICD-10-CM | POA: Diagnosis present

## 2020-05-21 DIAGNOSIS — L89311 Pressure ulcer of right buttock, stage 1: Secondary | ICD-10-CM | POA: Diagnosis present

## 2020-05-21 DIAGNOSIS — R059 Cough, unspecified: Secondary | ICD-10-CM

## 2020-05-21 DIAGNOSIS — G9341 Metabolic encephalopathy: Secondary | ICD-10-CM | POA: Diagnosis not present

## 2020-05-21 DIAGNOSIS — G609 Hereditary and idiopathic neuropathy, unspecified: Secondary | ICD-10-CM | POA: Diagnosis not present

## 2020-05-21 DIAGNOSIS — W010XXA Fall on same level from slipping, tripping and stumbling without subsequent striking against object, initial encounter: Secondary | ICD-10-CM | POA: Diagnosis present

## 2020-05-21 DIAGNOSIS — L8990 Pressure ulcer of unspecified site, unspecified stage: Secondary | ICD-10-CM | POA: Insufficient documentation

## 2020-05-21 DIAGNOSIS — Z7401 Bed confinement status: Secondary | ICD-10-CM | POA: Diagnosis not present

## 2020-05-21 DIAGNOSIS — R05 Cough: Secondary | ICD-10-CM

## 2020-05-21 DIAGNOSIS — N3281 Overactive bladder: Secondary | ICD-10-CM | POA: Diagnosis not present

## 2020-05-21 DIAGNOSIS — Z8781 Personal history of (healed) traumatic fracture: Secondary | ICD-10-CM

## 2020-05-21 DIAGNOSIS — M25559 Pain in unspecified hip: Secondary | ICD-10-CM

## 2020-05-21 DIAGNOSIS — E039 Hypothyroidism, unspecified: Secondary | ICD-10-CM | POA: Diagnosis present

## 2020-05-21 MED ORDER — PANTOPRAZOLE SODIUM 40 MG PO TBEC
40.0000 mg | DELAYED_RELEASE_TABLET | Freq: Every day | ORAL | Status: DC
  Administered 2020-05-21 – 2020-05-30 (×10): 40 mg via ORAL
  Filled 2020-05-21 (×10): qty 1

## 2020-05-21 MED ORDER — ENOXAPARIN SODIUM 40 MG/0.4ML ~~LOC~~ SOLN
40.0000 mg | Freq: Once | SUBCUTANEOUS | Status: AC
  Administered 2020-05-21: 40 mg via SUBCUTANEOUS
  Filled 2020-05-21: qty 0.4

## 2020-05-21 MED ORDER — FOLIC ACID 1 MG PO TABS
500.0000 ug | ORAL_TABLET | Freq: Every day | ORAL | Status: DC
  Administered 2020-05-21 – 2020-05-29 (×9): 0.5 mg via ORAL
  Filled 2020-05-21 (×9): qty 1

## 2020-05-21 MED ORDER — ALFUZOSIN HCL ER 10 MG PO TB24
10.0000 mg | ORAL_TABLET | Freq: Every day | ORAL | Status: DC
  Administered 2020-05-21 – 2020-05-26 (×6): 10 mg via ORAL
  Filled 2020-05-21 (×6): qty 1

## 2020-05-21 MED ORDER — MEMANTINE HCL 10 MG PO TABS
5.0000 mg | ORAL_TABLET | Freq: Two times a day (BID) | ORAL | Status: DC
  Administered 2020-05-21 – 2020-05-30 (×18): 5 mg via ORAL
  Filled 2020-05-21 (×19): qty 1

## 2020-05-21 MED ORDER — LEVOTHYROXINE SODIUM 75 MCG PO TABS
75.0000 ug | ORAL_TABLET | Freq: Every day | ORAL | Status: DC
  Administered 2020-05-22 – 2020-05-30 (×9): 75 ug via ORAL
  Filled 2020-05-21 (×9): qty 1

## 2020-05-21 MED ORDER — HYDROXYZINE HCL 10 MG PO TABS
10.0000 mg | ORAL_TABLET | Freq: Three times a day (TID) | ORAL | Status: DC | PRN
  Administered 2020-05-27: 10 mg via ORAL
  Filled 2020-05-21 (×4): qty 1

## 2020-05-21 MED ORDER — OLANZAPINE 5 MG PO TABS
15.0000 mg | ORAL_TABLET | Freq: Every day | ORAL | Status: DC
  Administered 2020-05-21 – 2020-05-24 (×4): 15 mg via ORAL
  Filled 2020-05-21 (×4): qty 1

## 2020-05-21 MED ORDER — DONEPEZIL HCL 10 MG PO TABS: 5.0000 mg | ORAL_TABLET | Freq: Every day | ORAL | Status: DC

## 2020-05-21 MED ORDER — HYDROCODONE-ACETAMINOPHEN 5-325 MG PO TABS
1.0000 | ORAL_TABLET | Freq: Four times a day (QID) | ORAL | Status: DC | PRN
  Administered 2020-05-21: 1 via ORAL
  Administered 2020-05-22 (×2): 2 via ORAL
  Filled 2020-05-21 (×4): qty 2

## 2020-05-21 MED ORDER — TROSPIUM CHLORIDE ER 60 MG PO CP24: 60.0000 mg | ORAL_CAPSULE | Freq: Every evening | ORAL | Status: DC

## 2020-05-21 MED ORDER — SERTRALINE HCL 100 MG PO TABS: 200.0000 mg | ORAL_TABLET | Freq: Every day | ORAL | Status: DC

## 2020-05-21 MED ORDER — DIVALPROEX SODIUM 125 MG PO DR TAB
125.0000 mg | DELAYED_RELEASE_TABLET | Freq: Every day | ORAL | Status: DC
  Administered 2020-05-21 – 2020-05-29 (×9): 125 mg via ORAL
  Filled 2020-05-21 (×10): qty 1

## 2020-05-21 MED ORDER — MIRABEGRON ER 25 MG PO TB24: 50.0000 mg | ORAL_TABLET | Freq: Every day | ORAL | Status: DC

## 2020-05-21 MED ORDER — GABAPENTIN 300 MG PO CAPS: 600.0000 mg | ORAL_CAPSULE | Freq: Three times a day (TID) | ORAL | Status: DC

## 2020-05-21 MED ORDER — SERTRALINE HCL 100 MG PO TABS
100.0000 mg | ORAL_TABLET | Freq: Two times a day (BID) | ORAL | Status: DC
  Administered 2020-05-21 – 2020-05-25 (×8): 100 mg via ORAL
  Filled 2020-05-21 (×10): qty 1

## 2020-05-21 MED ORDER — AMLODIPINE BESYLATE 5 MG PO TABS
2.5000 mg | ORAL_TABLET | Freq: Every day | ORAL | Status: DC
  Administered 2020-05-21 – 2020-05-24 (×4): 2.5 mg via ORAL
  Filled 2020-05-21 (×4): qty 1

## 2020-05-21 MED ORDER — GABAPENTIN 300 MG PO CAPS
600.0000 mg | ORAL_CAPSULE | Freq: Three times a day (TID) | ORAL | Status: DC
  Administered 2020-05-21 – 2020-05-25 (×10): 600 mg via ORAL
  Filled 2020-05-21 (×10): qty 2

## 2020-05-21 MED ORDER — LEVOTHYROXINE SODIUM 112 MCG PO TABS: 112.0000 ug | ORAL_TABLET | Freq: Every morning | ORAL | Status: DC

## 2020-05-21 MED ORDER — POLYETHYLENE GLYCOL 3350 17 G PO PACK
17.0000 g | PACK | Freq: Every day | ORAL | Status: DC
  Administered 2020-05-22 – 2020-05-26 (×4): 17 g via ORAL
  Filled 2020-05-21 (×4): qty 1

## 2020-05-21 MED ORDER — MORPHINE SULFATE (PF) 2 MG/ML IV SOLN
0.5000 mg | INTRAVENOUS | Status: DC | PRN
  Administered 2020-05-21 – 2020-05-22 (×4): 0.5 mg via INTRAVENOUS
  Filled 2020-05-21 (×4): qty 1

## 2020-05-21 NOTE — Progress Notes (Signed)
Incentive spirometer given and educated about proper usage. Patient demonstrates understanding by verbal communication and teach back understanding. Achieved 2000- has completed 10x.

## 2020-05-21 NOTE — H&P (Signed)
[facility] History and Physical  Christian Miller WYO:[id number] DOB: 1935-03-24 DOA: 05/21/2020   PCP: Christian Bis, MD  Specialists: Followed by urology, Dr. Alyson Miller.  Chief Complaint: Pain in the right hip area  HPI: Christian Miller. is a 85 y.o. male with a past medical history of dementia, overactive bladder, essential hypertension, behavioral disturbances secondary to dementia, hypothyroidism who was in his usual state of health till yesterday when he was in the kitchen and he lost his balance and he fell.  Denies any dizziness or lightheadedness prior to his fall.  No chest pain shortness of breath.  No syncopal episodes.  Denies any head injuries.  He experienced pain in his right leg.  He was brought to the emergency department at [facility].  Currently pain is tolerable as long as he is still.  Increases to 7 out of 10 in intensity when he moves.  Denies any nausea or vomiting.  Evaluation at [facility] revealed right hip fracture.  There was no orthopedic coverage at that facility.  So the patient was transferred to [facility].  Home Medications: Prior to Admission medications   Medication Sig Start Date End Date Taking? Authorizing Provider  acetaminophen (TYLENOL) 650 MG CR tablet Take 650 mg by mouth every 8 (eight) hours as needed for pain.    [provider]  alfuzosin (UROXATRAL) 10 MG 24 hr tablet Take 1 tablet (10 mg total) by mouth at bedtime. 03/15/20   Miller, Christian Furbish, MD  amLODipine (NORVASC) 2.5 MG tablet Take 2.5 mg by mouth daily. 03/03/20   [provider]  amLODipine (NORVASC) 5 MG tablet Take 5 mg by mouth every morning. for high blood pressure 02/27/19   [provider]  aspirin EC 81 MG tablet Take 81 mg by mouth every Monday, Wednesday, and Friday. At night.     [provider]  Cholecalciferol (VITAMIN D3) 50 MCG (2000 UT) TABS Take 2,000 Units by mouth daily.    [provider]    darifenacin (ENABLEX) 7.5 MG 24 hr tablet Take 1 tablet (7.5 mg total) by mouth daily. Patient not taking: Reported on 03/15/2020 07/29/19   Barton Dubois, MD  divalproex (DEPAKOTE) 125 MG DR tablet Take 125 mg by mouth at bedtime.    [provider]  donepezil (ARICEPT) 5 MG tablet Take 5 mg by mouth at bedtime.    [provider]  fluticasone (FLONASE) 50 MCG/ACT nasal spray Place 2 sprays into both nostrils daily. 12/01/19   [provider]  folic acid (FOLATE) 774 MCG tablet Take 1 tablet by mouth at bedtime.     [provider]  gabapentin (NEURONTIN) 600 MG tablet Take 600 mg by mouth 3 (three) times daily.     [provider]  HYDROcodone-acetaminophen (NORCO) 5-325 MG tablet Take 1 tablet by mouth every 4 (four) hours as needed for moderate pain. Patient taking differently: Take 1 tablet by mouth at bedtime as needed for moderate pain.  07/19/19   Miller, Christian Furbish, MD  hydrOXYzine (ATARAX/VISTARIL) 10 MG tablet Take 10 mg by mouth every 6 (six) hours as needed for anxiety.  07/09/19   [provider]  levothyroxine (SYNTHROID) 112 MCG tablet Take 112 mcg by mouth every morning. 09/24/19   [provider]  levothyroxine (SYNTHROID) 137 MCG tablet Take 137 mcg by mouth daily before breakfast.     [provider]  levothyroxine (SYNTHROID) 75 MCG tablet Take 75 mcg by  mouth every morning. 12/18/19   [provider]  memantine (NAMENDA) 5 MG tablet Take 5 mg by mouth 2 (two) times daily.    [provider]  Multiple Vitamins-Minerals (MULTIVITAMINS THER. W/MINERALS) TABS Take 1 tablet by mouth daily.      [provider]  MYRBETRIQ 50 MG TB24 tablet Take 1 tablet (50 mg total) by mouth daily. 03/15/20   Miller, Christian Furbish, MD  nystatin cream (MYCOSTATIN)  03/09/20   [provider]  OLANZapine (ZYPREXA) 15 MG tablet Take 15 mg by mouth at bedtime.    [provider]  Omega-3 Fatty  Acids (FISH OIL) 1000 MG CAPS Take 1,000 mg by mouth every other day.     [provider]  omeprazole (PRILOSEC) 20 MG capsule Take 20 mg by mouth 2 (two) times daily before a meal.     [provider]  polyethylene glycol powder (GLYCOLAX/MIRALAX) 17 GM/SCOOP powder Take 17 g by mouth daily.    [provider]  sertraline (ZOLOFT) 100 MG tablet Take 200 mg by mouth daily. 10/22/19   [provider]  sertraline (ZOLOFT) 25 MG tablet Take 25 mg by mouth at bedtime.     [provider]  tamsulosin (FLOMAX) 0.4 MG CAPS capsule Take 0.4 mg by mouth every morning.  05/06/19   [provider]  Trospium Chloride 60 MG CP24 Take 1 capsule (60 mg total) by mouth every morning. 03/15/20   Miller, Christian Furbish, MD    Allergies:  Allergies  Allergen Reactions  . Pneumococcal Vaccines Rash    Past Medical History: Past Medical History:  Diagnosis Date  . Anxiety   . Arthritis   . BPH (benign prostatic hyperplasia)   . COPD (chronic obstructive pulmonary disease) ([place])   . Depression   . GERD (gastroesophageal reflux disease)   . HOH (hard of hearing)   . Hypertension   . PONV (postoperative nausea and vomiting)     Past Surgical History:  Procedure Laterality Date  . COMPRESSION HIP SCREW     [facility]  . CYSTOSCOPY WITH INJECTION N/A 07/19/2019   Procedure: CYSTOSCOPY WITH INJECTION OF MITOMYCIN;  Surgeon: Christian Gustin, MD;  Location: [facility];  Service: Urology;  Laterality: N/A;  . HERNIA REPAIR     [facility]  . PARTIAL HIP ARTHROPLASTY     [facility]  . REVISION TOTAL HIP ARTHROPLASTY     [facility]  . TRANSURETHRAL INCISION OF BLADDER NECK N/A 07/19/2019   Procedure: TRANSURETHRAL INCISION OF BLADDER NECK;  Surgeon: Christian Gustin, MD;  Location: [facility];  Service: Urology;  Laterality: N/A;  . TRANSURETHRAL RESECTION OF PROSTATE  07/09/2011   Procedure: TRANSURETHRAL RESECTION OF THE PROSTATE (TURP);  Surgeon: Christian Miller;   Location: [facility];  Service: Urology;  Laterality: N/A;    Social History: Patient lives with his wife.  Denies any history of smoking alcohol use illicit drug use.  Usually independent with daily activities.  Family History:  Family History  Problem Relation Age of Onset  . Heart disease Father   . Anesthesia problems Neg Hx   . Hypotension Neg Hx   . Malignant hyperthermia Neg Hx   . Pseudochol deficiency Neg Hx      Review of Systems - History obtained from the patient General ROS: positive for  - fatigue Psychological ROS: negative Ophthalmic ROS: negative ENT ROS: negative Allergy and Immunology ROS: negative Hematological and Lymphatic ROS: negative Endocrine ROS: negative Respiratory ROS: no  cough, shortness of breath, or wheezing Cardiovascular ROS: no chest pain or dyspnea on exertion Gastrointestinal ROS: no abdominal pain, change in bowel habits, or black or bloody stools Genito-Urinary ROS: no dysuria, trouble voiding, or hematuria Musculoskeletal ROS: As in HPI. Neurological ROS: no TIA or stroke symptoms Dermatological ROS: negative  Physical Examination  Vitals:   05/21/20 1018  BP: (!) 154/87  Pulse: 64  Resp: 15  Temp: 97.9 F (36.6 C)  TempSrc: Axillary  SpO2: 99%    BP (!) 154/87 (BP Location: Left Arm)   Pulse 64   Temp 97.9 F (36.6 C) (Axillary)   Resp 15   SpO2 99%   General appearance: alert, cooperative, appears stated age, distracted and no distress Head: Normocephalic, without obvious abnormality, atraumatic Eyes: conjunctivae/corneas clear. PERRL, EOM's intact. Throat: lips, mucosa, and tongue normal; teeth and gums normal Neck: no adenopathy, no carotid bruit, no JVD, supple, symmetrical, trachea midline and thyroid not enlarged, symmetric, no tenderness/mass/nodules Resp: clear to auscultation bilaterally Cardio: regular rate and rhythm, S1, S2 normal, no murmur, click, rub or gallop GI: soft, non-tender; bowel sounds normal; no  masses,  no organomegaly Extremities: Right lower extremity is externally rotated.  Able to wiggle his toes. Pulses: 2+ and symmetric Skin: Skin color, texture, turgor normal. No rashes or lesions Lymph nodes: Cervical, supraclavicular, and axillary nodes normal. Neurologic: No obvious focal neurological deficits noted.  Mildly distracted.   Labs on Admission:   Labs were all done at [facility].  WBC 16.2.  Hemoglobin 12.9, platelets 247,000  Sodium 130, potassium 4.3, chloride 93, bicarb 28, BUN 16, creatinine 1.02. Glucose 97, calcium 8.7, albumin 4.0, bilirubin 0.4, AST and ALT both were 21.  Alkaline phosphatase 98.  He did have COVID-19 test that was reportedly negative.   Radiological Exams on Admission: DG HIP (WITH OR WITHOUT PELVIS) 2-3V RIGHT FINDINGS: There is a right femoral neck fracture with impaction. No subluxation or dislocation. Prior left hip replacement. IMPRESSION: Right femoral neck fracture with impaction.   My interpretation of Electrocardiogram: Sinus rhythm in the 60s.  First-degree AV block noted.  Left axis.  Nonspecific T wave changes.  No concerning ST segment changes noted.   Problem List  Principal Problem:   Closed right hip fracture (HCC) Active Problems:   HTN (hypertension)   OAB (overactive bladder)   Dementia without behavioral disturbance (HCC)   Assessment: This is a 85 year old male with a past medical history of dementia, essential hypertension who presented after mechanical fall resulting in right hip fracture.  Patient was transferred here from [facility] as they did not have orthopedic coverage there.  Plan:  1. Right hip fracture: Management per orthopedics.  Discussed with Dr. Lucia Gaskins who will evaluate patient later today.  He requested we keep the patient n.p.o. for now.  Pain control.    2. Preoperative evaluation: Chest x-ray has been ordered.  EKG reviewed. Based on the [place] Perioperative Risk Index the  patient's estimated risk probability for perioperative myocardial infarction or cardiac arrest is 0.42%. Patient's procedure is intermediate risk. Patient's functional capacity is moderate. Based on the AHA/ACC algorithms patient may proceed to surgery without further cardiac testing. CXR is pending. Postoperatively patient will benefit from pulmonary toilet. Incentive spirometry post operatively.  3. First-degree AV block: Patient noted to be on donepezil which can cause AV block.  This medication will be held.  Not noted to be on any beta-blockers.  4.  History of dementia with behavioral disturbances: Continue with  memantine.  Patient also noted to be on Depakote, Zyprexa, Zoloft.  These can be continued.  Behavior seems to be appropriate at this time.  5.  History of essential hypertension: Blood pressure noted to be elevated.  Continue with low-dose amlodipine.  6.  History of overactive bladder: Followed by urology, Dr. Alyson Miller.  Continue with his home medications.  7. Leukocytosis: WBC was 16.2.  He is afebrile.  Likely reactive.  Recheck tomorrow morning.  8.  Hyponatremia: Sodium is 130. We will recheck it tomorrow.  DVT Prophylaxis: Definitive pharmacological prophylaxis after he undergoes surgery.  SCDs for now Code Status: Full code Family Communication: Discussed with the patient.  No family at bedside currently Disposition: Might need to go to skilled nursing facility for short-term rehab Consults called: Dr. Lucia Gaskins with orthopedics Admission Status: Status is: Inpatient  Remains inpatient appropriate because:Ongoing active pain requiring inpatient pain management   Dispo: The patient is from: Home              Anticipated d/c is to: SNF              Anticipated d/c date is: 3 days              Patient currently is not medically stable to d/c.    Severity of Illness: The appropriate patient status for this patient is INPATIENT. Inpatient status is judged to be reasonable  and necessary in order to provide the required intensity of service to ensure the patient's safety. The patient's presenting symptoms, physical exam findings, and initial radiographic and laboratory data in the context of their chronic comorbidities is felt to place them at high risk for further clinical deterioration. Furthermore, it is not anticipated that the patient will be medically stable for discharge from the hospital within 2 midnights of admission. The following factors support the patient status of inpatient.   " The patient's presenting symptoms include right hip pain. " The worrisome physical exam findings include externally rotated right leg. " The initial radiographic and laboratory data are worrisome because of right hip fracture. " The chronic co-morbidities include dementia.   * I certify that at the point of admission it is my clinical judgment that the patient will require inpatient hospital care spanning beyond 2 midnights from the point of admission due to high intensity of service, high risk for further deterioration and high frequency of surveillance required.*    Further management decisions will depend on results of further testing and patient's response to treatment.   Christian Miller [employer]  [facility] operational officer on [email or web address]  05/21/2020, 11:01 AM

## 2020-05-21 NOTE — Consult Note (Addendum)
Reason for Consult: Right femoral neck fracture Referring Physician: Benson Norway  Christian ALCOCK Sr. is an 85 y.o. male.  HPI: Had a fall at home.  He had pain in his right hip and was seen in the emergency department.  X-rays revealed an impacted displaced femoral neck fracture.  He has evidence of arthritic change within the right hip joint and history of left total hip arthroplasty.  Patient ambulates without assistance at baseline.  He has a history of mild dementia but lives alone with his wife.  Orthopedics was consulted.  Patient complains of pain in the right hip only.  He denies pain in the right knee, right ankle or foot.  He denies any other joint or extremity pain.  Pain is sharp and achy in quality.  Pain is minimal if he sits still.  Past Medical History:  Diagnosis Date  . Anxiety   . Arthritis   . BPH (benign prostatic hyperplasia)   . COPD (chronic obstructive pulmonary disease) ([place])   . Depression   . GERD (gastroesophageal reflux disease)   . HOH (hard of hearing)   . Hypertension   . PONV (postoperative nausea and vomiting)     Past Surgical History:  Procedure Laterality Date  . COMPRESSION HIP SCREW     [facility]  . CYSTOSCOPY WITH INJECTION N/A 07/19/2019   Procedure: CYSTOSCOPY WITH INJECTION OF MITOMYCIN;  Surgeon: Cleon Gustin, MD;  Location: [facility];  Service: Urology;  Laterality: N/A;  . HERNIA REPAIR     [facility]  . PARTIAL HIP ARTHROPLASTY     [facility]  . REVISION TOTAL HIP ARTHROPLASTY     [facility]  . TRANSURETHRAL INCISION OF BLADDER NECK N/A 07/19/2019   Procedure: TRANSURETHRAL INCISION OF BLADDER NECK;  Surgeon: Cleon Gustin, MD;  Location: [facility];  Service: Urology;  Laterality: N/A;  . TRANSURETHRAL RESECTION OF PROSTATE  07/09/2011   Procedure: TRANSURETHRAL RESECTION OF THE PROSTATE (TURP);  Surgeon: Marissa Nestle;  Location: [facility];  Service: Urology;  Laterality: N/A;    Family History  Problem Relation Age of Onset   . Heart disease Father   . Anesthesia problems Neg Hx   . Hypotension Neg Hx   . Malignant hyperthermia Neg Hx   . Pseudochol deficiency Neg Hx     Social History:  reports that he quit smoking about 28 years ago. His smoking use included cigarettes. He has a 12.50 pack-year smoking history. He has never used smokeless tobacco. He reports that he does not drink alcohol and does not use drugs.  Allergies:  Allergies  Allergen Reactions  . Pneumococcal Vaccines Rash    Medications: I have reviewed the patient's current medications.  No results found for this or any previous visit (from the past 48 hour(s)).  DG CHEST PORT 1 VIEW  Result Date: 05/21/2020 CLINICAL DATA:  Pre-op for hip fracture surgery. Denies any chest complaints today. H/o COPD, HTN. Former smoker. EXAM: PORTABLE CHEST 1 VIEW COMPARISON:  None. FINDINGS: Normal cardiac silhouette. Mild LEFT basilar atelectasis and potential small LEFT effusion. RIGHT lung clear. No pneumothorax. No acute osseous abnormality. IMPRESSION: LEFT basilar atelectasis and small effusion. Electronically Signed   By: Suzy Bouchard M.D.   On: 05/21/2020 13:38   DG HIP UNILAT WITH PELVIS 2-3 VIEWS RIGHT  Result Date: 05/21/2020 CLINICAL DATA:  Known right hip fracture EXAM: DG HIP (WITH OR WITHOUT PELVIS) 2-3V RIGHT; RIGHT FEMUR 2 VIEWS COMPARISON:  05/20/2020 FINDINGS: Redemonstrated impacted subcapital  fracture of the right femoral neck. There are small avulsion fracture fragments or vascular calcifications adjacent to the right lesser trochanter. No fracture or dislocation of the distal right femur. No displaced fracture or dislocation of the pelvis seen in single frontal view. Status post left hip total arthroplasty. IMPRESSION: 1. Redemonstrated impacted subcapital fracture of the right femoral neck. There are small avulsion fracture fragments or vascular calcifications adjacent to the right lesser trochanter without other obvious fracture. 2.   The distal right femur is intact. Electronically Signed   By: Eddie Candle M.D.   On: 05/21/2020 14:09   DG FEMUR, MIN 2 VIEWS RIGHT  Result Date: 05/21/2020 CLINICAL DATA:  Known right hip fracture EXAM: DG HIP (WITH OR WITHOUT PELVIS) 2-3V RIGHT; RIGHT FEMUR 2 VIEWS COMPARISON:  05/20/2020 FINDINGS: Redemonstrated impacted subcapital fracture of the right femoral neck. There are small avulsion fracture fragments or vascular calcifications adjacent to the right lesser trochanter. No fracture or dislocation of the distal right femur. No displaced fracture or dislocation of the pelvis seen in single frontal view. Status post left hip total arthroplasty. IMPRESSION: 1. Redemonstrated impacted subcapital fracture of the right femoral neck. There are small avulsion fracture fragments or vascular calcifications adjacent to the right lesser trochanter without other obvious fracture. 2.  The distal right femur is intact. Electronically Signed   By: Eddie Candle M.D.   On: 05/21/2020 14:09    Review of Systems  Constitutional: Negative.   HENT: Negative.   Eyes: Negative.   Cardiovascular: Negative.   Gastrointestinal: Negative.   Musculoskeletal:       Right hip pain  Skin: Negative.   Neurological: Negative.    Blood pressure 138/78, pulse 60, temperature 97.8 F (36.6 C), temperature source Oral, resp. rate 14, SpO2 97 %. Physical Exam HENT:     Head: Normocephalic.     Mouth/Throat:     Mouth: Mucous membranes are moist.  Cardiovascular:     Rate and Rhythm: Normal rate.  Pulmonary:     Effort: Pulmonary effort is normal.  Abdominal:     Palpations: Abdomen is soft.  Musculoskeletal:     Cervical back: Neck supple.     Comments: Pain to palpation about the right hip.  Pain with attempted motion at the hip.  No tenderness to palpation distally about the thigh, knee, leg, ankle or foot on the right side.  No evidence of left lower extremity injury or bilateral upper extremity injury.   Sensation intact distally about bilateral lower extremities.  Skin:    General: Skin is warm.  Neurological:     Mental Status: He is alert and oriented to person, place, and time.  Psychiatric:        Mood and Affect: Mood normal.     Assessment/Plan: Right displaced femoral neck fracture with underlying osteoarthritis of the right hip as well as left total hip arthroplasty in the past.  We had a lengthy discussion with him and his wife.  Patient has a displaced right femoral neck fracture and therefore is indicated for hip hemiarthroplasty versus total hip arthroplasty.  After discussion the patient requests a total hip arthroplasty as he has done well with the one on the contralateral side.  Given that he is requesting a total hip arthroplasty I have spoke with Dr. Carter Kitten about his availability to perform the surgery for Mr. Diekman.  He does have availability on Tuesday to do this.  If sooner availability arises we will plan for  that.  For now patient may have a diet and will remain on bedrest.  Okay to start Lovenox and stop the evening prior to surgery.  Patient takes an 81 mg aspirin daily but no other anticoagulant medication.  I will put in preoperative orders in anticipation of surgery.  We did discuss the risks, benefits and alternatives to surgery which include but not limited to wound healing complications, infection, need for further surgery, continued pain and damage to surrounding structures.  After weighing these risks the patient opted to proceed.  Erle Crocker 05/21/2020, 2:39 PM

## 2020-05-21 NOTE — Progress Notes (Signed)
Patient attending hospitalist/ [facility] on call notified of patient admission.

## 2020-05-21 NOTE — Plan of Care (Signed)
  Problem: Education: Goal: Knowledge of General Education information will improve Description: Including pain rating scale, medication(s)/side effects and non-pharmacologic comfort measures Outcome: Progressing   Problem: Health Behavior/Discharge Planning: Goal: Ability to manage health-related needs will improve Outcome: Progressing   Problem: Clinical Measurements: Goal: Ability to maintain clinical measurements within normal limits will improve Outcome: Progressing   Problem: Clinical Measurements: Goal: Ability to maintain clinical measurements within normal limits will improve Outcome: Progressing Goal: Will remain free from infection Outcome: Progressing Goal: Diagnostic test results will improve Outcome: Progressing Goal: Respiratory complications will improve Outcome: Progressing Goal: Cardiovascular complication will be avoided Outcome: Progressing   Problem: Activity: Goal: Risk for activity intolerance will decrease Outcome: Progressing   Problem: Nutrition: Goal: Adequate nutrition will be maintained Outcome: Progressing   Problem: Coping: Goal: Level of anxiety will decrease Outcome: Progressing   Problem: Pain Managment: Goal: General experience of comfort will improve Outcome: Progressing   Problem: Skin Integrity: Goal: Risk for impaired skin integrity will decrease Outcome: Progressing   Problem: Safety: Goal: Ability to remain free from injury will improve Outcome: Progressing   Problem: Education: Goal: Knowledge of General Education information will improve Description: Including pain rating scale, medication(s)/side effects and non-pharmacologic comfort measures Outcome: Progressing   Problem: Clinical Measurements: Goal: Ability to maintain clinical measurements within normal limits will improve Outcome: Progressing Goal: Will remain free from infection Outcome: Progressing Goal: Diagnostic test results will improve Outcome:  Progressing Goal: Respiratory complications will improve Outcome: Progressing Goal: Cardiovascular complication will be avoided Outcome: Progressing   Problem: Activity: Goal: Risk for activity intolerance will decrease Outcome: Progressing   Problem: Nutrition: Goal: Adequate nutrition will be maintained Outcome: Progressing   Problem: Coping: Goal: Level of anxiety will decrease Outcome: Progressing   Problem: Elimination: Goal: Will not experience complications related to bowel motility Outcome: Progressing Goal: Will not experience complications related to urinary retention Outcome: Progressing   Problem: Pain Managment: Goal: General experience of comfort will improve Outcome: Progressing   Problem: Education: Goal: Verbalization of understanding the information provided (i.e., activity precautions, restrictions, etc) will improve Outcome: Progressing Goal: Individualized Educational Video(s) Outcome: Progressing   Problem: Clinical Measurements: Goal: Postoperative complications will be avoided or minimized Outcome: Progressing   Problem: Self-Concept: Goal: Ability to maintain and perform role responsibilities to the fullest extent possible will improve Outcome: Progressing   Problem: Pain Management: Goal: Pain level will decrease Outcome: Progressing

## 2020-05-22 ENCOUNTER — Encounter (HOSPITAL_COMMUNITY): Payer: Self-pay | Admitting: Internal Medicine

## 2020-05-22 DIAGNOSIS — E871 Hypo-osmolality and hyponatremia: Secondary | ICD-10-CM

## 2020-05-22 DIAGNOSIS — N3281 Overactive bladder: Secondary | ICD-10-CM

## 2020-05-22 DIAGNOSIS — F0390 Unspecified dementia without behavioral disturbance: Secondary | ICD-10-CM

## 2020-05-22 LAB — CBC
HCT: 35.3 % — ABNORMAL LOW (ref 39.0–52.0)
Hemoglobin: 11.7 g/dL — ABNORMAL LOW (ref 13.0–17.0)
MCH: 29.7 pg (ref 26.0–34.0)
MCHC: 33.1 g/dL (ref 30.0–36.0)
MCV: 89.6 fL (ref 80.0–100.0)
Platelets: 229 10*3/uL (ref 150–400)
RBC: 3.94 MIL/uL — ABNORMAL LOW (ref 4.22–5.81)
RDW: 12.7 % (ref 11.5–15.5)
WBC: 13.6 10*3/uL — ABNORMAL HIGH (ref 4.0–10.5)
nRBC: 0 % (ref 0.0–0.2)

## 2020-05-22 LAB — BASIC METABOLIC PANEL
Anion gap: 8 (ref 5–15)
Anion gap: 9 (ref 5–15)
BUN: 16 mg/dL (ref 8–23)
BUN: 14 mg/dL (ref 8–23)
CO2: 23 mmol/L (ref 22–32)
CO2: 27 mmol/L (ref 22–32)
Calcium: 8.4 mg/dL — ABNORMAL LOW (ref 8.9–10.3)
Calcium: 8.9 mg/dL (ref 8.9–10.3)
Chloride: 93 mmol/L — ABNORMAL LOW (ref 98–111)
Chloride: 98 mmol/L (ref 98–111)
Creatinine, Ser: 1.01 mg/dL (ref 0.61–1.24)
Creatinine, Ser: 1.2 mg/dL (ref 0.61–1.24)
GFR calc Af Amer: >60 mL/min (ref >60)
GFR calc Af Amer: >60 mL/min (ref >60)
GFR calc non Af Amer: >60 mL/min (ref >60)
GFR calc non Af Amer: 56 mL/min — ABNORMAL LOW (ref >60)
Glucose, Bld: 112 mg/dL — ABNORMAL HIGH (ref 70–99)
Glucose, Bld: 135 mg/dL — ABNORMAL HIGH (ref 70–99)
Potassium: 4 mmol/L (ref 3.5–5.1)
Potassium: 4.5 mmol/L (ref 3.5–5.1)
Sodium: 124 mmol/L — ABNORMAL LOW (ref 135–145)
Sodium: 134 mmol/L — ABNORMAL LOW (ref 135–145)

## 2020-05-22 MED ORDER — ADULT MULTIVITAMIN W/MINERALS CH
1.0000 | ORAL_TABLET | Freq: Every day | ORAL | Status: DC
  Administered 2020-05-22 – 2020-05-30 (×8): 1 via ORAL
  Filled 2020-05-22 (×8): qty 1

## 2020-05-22 MED ORDER — POVIDONE-IODINE 10 % EX SWAB
2.0000 "application " | Freq: Once | CUTANEOUS | Status: AC
  Administered 2020-05-23: 2 via TOPICAL

## 2020-05-22 MED ORDER — CEFAZOLIN SODIUM-DEXTROSE 2-4 GM/100ML-% IV SOLN
2.0000 g | INTRAVENOUS | Status: AC
  Administered 2020-05-23: 2 g via INTRAVENOUS
  Filled 2020-05-22: qty 100

## 2020-05-22 MED ORDER — CHLORHEXIDINE GLUCONATE 4 % EX LIQD: 60.0000 mL | Freq: Once | CUTANEOUS | Status: DC

## 2020-05-22 MED ORDER — KATE FARMS STANDARD 1.4 PO LIQD
325.0000 mL | Freq: Every day | ORAL | Status: DC
  Administered 2020-05-22 – 2020-05-29 (×8): 325 mL via ORAL
  Filled 2020-05-22 (×9): qty 325

## 2020-05-22 MED ORDER — SODIUM CHLORIDE 0.9 % IV BOLUS
500.0000 mL | Freq: Once | INTRAVENOUS | Status: AC
  Administered 2020-05-22: 500 mL via INTRAVENOUS

## 2020-05-22 MED ORDER — PROSOURCE PLUS PO LIQD
30.0000 mL | Freq: Every day | ORAL | Status: DC
  Administered 2020-05-24 – 2020-05-30 (×7): 30 mL via ORAL
  Filled 2020-05-22 (×8): qty 30

## 2020-05-22 MED ORDER — TRANEXAMIC ACID-NACL 1000-0.7 MG/100ML-% IV SOLN
1000.0000 mg | INTRAVENOUS | Status: AC
  Administered 2020-05-23: 1000 mg via INTRAVENOUS
  Filled 2020-05-22: qty 100

## 2020-05-22 NOTE — NC FL2 (Signed)
[place] LEVEL OF CARE SCREENING TOOL     IDENTIFICATION  Patient Name: Christian FLAVELL Sr. Birthdate: [birth year] Sex: male Admission Date (Current Location): 05/21/2020  [facility] and [place] Number:  Herbalist and Address:  [facility],  [place], [place]      Provider Number: [id number]  Attending Physician Name and Address:  Debbe Odea, MD  Relative Name and Phone Number:  wife, Christian Miller @ [phone]    Current Level of Care: Hospital Recommended Level of Care: [place] Prior Approval Number:    Date Approved/Denied:   PASRR Number:    Discharge Plan: SNF    Current Diagnoses: Patient Active Problem List   Diagnosis Date Noted  . Closed right hip fracture ([place]) 05/21/2020  . Dementia without behavioral disturbance ([place]) 05/21/2020  . OAB (overactive bladder) 12/15/2019  . Gram negative sepsis ([place]) 07/27/2019  . Sepsis due to undetermined organism ([place]) 07/26/2019  . Bladder outlet obstruction 07/26/2019  . Urinary tract infection associated with indwelling urethral catheter ([place])   . Acute lower UTI 07/25/2019  . Sepsis ([place]) 07/25/2019  . Bilateral carotid artery disease ([place]) 12/08/2012  . Near syncope 12/06/2012  . Lightheadedness 12/06/2012  . Bradycardia 12/06/2012  . HTN (hypertension) 12/06/2012  . Hyponatremia 12/06/2012  . URI (upper respiratory infection) 12/06/2012    Orientation RESPIRATION BLADDER Height & Weight     Self, Situation, Place, Time  Normal Incontinent Weight: 200 lb 13.4 oz (91.1 kg) Height:  6' (182.9 cm)  BEHAVIORAL SYMPTOMS/MOOD NEUROLOGICAL BOWEL NUTRITION STATUS      Continent    AMBULATORY STATUS COMMUNICATION OF NEEDS Skin   Limited Assist Verbally Surgical wounds                       Personal Care Assistance Level of Assistance  Bathing, Dressing Bathing Assistance: Limited assistance   Dressing Assistance: Limited assistance      Functional Limitations Info             SPECIAL CARE FACTORS FREQUENCY  PT (By licensed PT), OT (By licensed OT)     PT Frequency: 5x/wk OT Frequency: 5x/wk            Contractures Contractures Info: Not present    Additional Factors Info  Code Status, Allergies, Psychotropic Code Status Info: Full Allergies Info: see MAR Psychotropic Info: see MAR         Current Medications (05/22/2020):  This is the current hospital active medication list Current Facility-Administered Medications  Medication Dose Route Frequency Provider Last Rate Last Admin  . (feeding supplement) PROSource Plus liquid 30 mL  30 mL Oral Daily Rizwan, Saima, MD      . alfuzosin (UROXATRAL) 24 hr tablet 10 mg  10 mg Oral QHS Bonnielee Haff, MD   10 mg at 05/21/20 2138  . amLODipine (NORVASC) tablet 2.5 mg  2.5 mg Oral Daily Bonnielee Haff, MD   2.5 mg at 05/22/20 0829  . divalproex (DEPAKOTE) DR tablet 125 mg  125 mg Oral QHS Bonnielee Haff, MD   125 mg at 05/21/20 2138  . feeding supplement (KATE FARMS STANDARD 1.4) liquid 325 mL  325 mL Oral Daily Rizwan, Eunice Blase, MD      . folic acid (FOLVITE) tablet 0.5 mg  500 mcg Oral QHS Bonnielee Haff, MD   0.5 mg at 05/21/20 2139  . gabapentin (NEURONTIN) capsule 600 mg  600 mg Oral TID [name],  Gokul, MD   600 mg at 05/22/20 1449  . HYDROcodone-acetaminophen (NORCO/VICODIN) 5-325 MG per tablet 1-2 tablet  1-2 tablet Oral Q6H PRN Bonnielee Haff, MD   2 tablet at 05/22/20 1448  . hydrOXYzine (ATARAX/VISTARIL) tablet 10 mg  10 mg Oral TID PRN Bonnielee Haff, MD      . levothyroxine (SYNTHROID) tablet 75 mcg  75 mcg Oral QAC breakfast Lenis Noon, RPH   75 mcg at 05/22/20 0549  . memantine (NAMENDA) tablet 5 mg  5 mg Oral BID Bonnielee Haff, MD   5 mg at 05/22/20 0829  . morphine 2 MG/ML injection 0.5 mg  0.5 mg Intravenous Q2H PRN Bonnielee Haff, MD   0.5 mg at 05/22/20 1049  . multivitamin with minerals tablet 1 tablet  1 tablet Oral Daily Debbe Odea, MD   1 tablet at 05/22/20 1449  . OLANZapine (ZYPREXA) tablet 15 mg  15 mg Oral QHS Bonnielee Haff, MD   15 mg at 05/21/20 2138  . pantoprazole (PROTONIX) EC tablet 40 mg  40 mg Oral Daily Bonnielee Haff, MD   40 mg at 05/22/20 0829  . polyethylene glycol (MIRALAX / GLYCOLAX) packet 17 g  17 g Oral Daily Bonnielee Haff, MD   17 g at 05/22/20 1049  . sertraline (ZOLOFT) tablet 100 mg  100 mg Oral BID Bonnielee Haff, MD   100 mg at 05/22/20 9485   Facility-Administered Medications Ordered in Other Encounters  Medication Dose Route Frequency Provider Last Rate Last Admin  . mitoMYcin (MUTAMYCIN) chemo injection 5 mg  5 mg Bladder Instillation Once McKenzie, Candee Furbish, MD         Discharge Medications: Please see discharge summary for a list of discharge medications.  Relevant Imaging Results:  Relevant Lab Results:   Additional Information SS# [id number]  Lennart Pall, LCSW

## 2020-05-22 NOTE — Consult Note (Signed)
ORTHOPAEDIC CONSULTATION  REQUESTING PHYSICIAN: Debbe Odea, MD  Chief Complaint: Right hip pain  HPI: Christian Miller. is a 85 y.o. male who complains of acute severe right hip pain after mechanical fall at home.  He normally walks with a walker at nighttime, with a cane during the daytime.  He has had a previous left hip fracture that had a total hip replacement done back in 1998.  Pain is worse with movement, better with rest.  I was asked by Dr. Lucia Gaskins to evaluate for possible total hip replacement.  The patient denies significant pre-existing hip pain.  Past Medical History:  Diagnosis Date  . Anxiety   . Arthritis   . BPH (benign prostatic hyperplasia)   . COPD (chronic obstructive pulmonary disease) ([place])   . Depression   . GERD (gastroesophageal reflux disease)   . HOH (hard of hearing)   . Hypertension   . PONV (postoperative nausea and vomiting)    Past Surgical History:  Procedure Laterality Date  . COMPRESSION HIP SCREW     [facility]  . CYSTOSCOPY WITH INJECTION N/A 07/19/2019   Procedure: CYSTOSCOPY WITH INJECTION OF MITOMYCIN;  Surgeon: Cleon Gustin, MD;  Location: [facility];  Service: Urology;  Laterality: N/A;  . HERNIA REPAIR     [facility]  . [facility]IAL HIP ARTHROPLASTY     [facility]  . REVISION TOTAL HIP ARTHROPLASTY     [facility]  . TRANSURETHRAL INCISION OF BLADDER NECK N/A 07/19/2019   Procedure: TRANSURETHRAL INCISION OF BLADDER NECK;  Surgeon: Cleon Gustin, MD;  Location: [facility];  Service: Urology;  Laterality: N/A;  . TRANSURETHRAL RESECTION OF PROSTATE  07/09/2011   Procedure: TRANSURETHRAL RESECTION OF THE PROSTATE (TURP);  Surgeon: Marissa Nestle;  Location: [facility];  Service: Urology;  Laterality: N/A;   Social History   Socioeconomic History  . Marital status: Married    Spouse name: Not on file  . Number of children: Not on file  . Years of education: Not on file  . Highest education level: Not on file  Occupational History  . Not  on file  Tobacco Use  . Smoking status: Former Smoker    Packs/day: 0.50    Years: 25.00    Pack years: 12.50    Types: Cigarettes    Quit date: 07/08/1991    Years since quitting: 28.8  . Smokeless tobacco: Never Used  Vaping Use  . Vaping Use: Never used  Substance and Sexual Activity  . Alcohol use: No  . Drug use: No  . Sexual activity: Yes    Birth control/protection: None  Other Topics Concern  . Not on file  Social History Narrative  . Not on file   Social Determinants of Health   Financial Resource Strain: Low Risk   . Difficulty of Paying Living Expenses: Not hard at all  Food Insecurity: No Food Insecurity  . Worried About Charity fundraiser in the Last Year: Never true  . Ran Out of Food in the Last Year: Never true  Transportation Needs: No Transportation Needs  . Lack of Transportation (Medical): No  . Lack of Transportation (Non-Medical): No  Physical Activity: Sufficiently Active  . Days of Exercise per Week: 7 days  . Minutes of Exercise per Session: 60 min  Stress: No Stress Concern Present  . Feeling of Stress : Not at all  Social Connections: Socially Integrated  . Frequency of Communication with Friends and Family: More than three times a  week  . Frequency of Social Gatherings with Friends and Family: Twice a week  . Attends Religious Services: More than 4 times per year  . Active Member of Clubs or Organizations: Yes  . Attends Archivist Meetings: Never  . Marital Status: Married   Family History  Problem Relation Age of Onset  . Heart disease Father   . Anesthesia problems Neg Hx   . Hypotension Neg Hx   . Malignant hyperthermia Neg Hx   . Pseudochol deficiency Neg Hx    Allergies  Allergen Reactions  . Pneumococcal Vaccines Rash     Positive ROS: All other systems have been reviewed and were otherwise negative with the exception of those mentioned in the HPI and as above.  Physical Exam: BP 140/64 (BP Location: Left Arm)    Pulse 61   Temp 99 F (37.2 C) (Oral)   Resp 16   SpO2 94%   General: Alert, no acute distress Cardiovascular: No pedal edema Respiratory: No cyanosis, no use of accessory musculature GI: No organomegaly, abdomen is soft and non-tender Skin: No lesions in the area of chief complaint Neurologic: Sensation intact distally Psychiatric: Patient is competent for consent with normal mood and affect Lymphatic: No axillary or cervical lymphadenopathy  MUSCULOSKELETAL: EHL and FHL are intact on the right side, positive logroll.  Assessment: Principal Problem:   Closed right hip fracture (HCC) Active Problems:   HTN (hypertension)   OAB (overactive bladder)   Dementia without behavioral disturbance ([place])   Plan: This is an acute significant injury that carries risks long-term for multiple complications, particular given his demographic.  I recommended total hip replacement, because it does look like he had some degree of degenerative changes preoperatively, and he has had a good outcome with total hip on the contralateral side for similar type injury.  The risks benefits and alternatives were discussed with the patient including but not limited to the risks of nonoperative treatment, versus surgical intervention including infection, bleeding, nerve injury, periprosthetic fracture, the need for revision surgery, dislocation, leg length discrepancy, blood clots, cardiopulmonary complications, morbidity, mortality, among others, and they were willing to proceed.    Plan for surgery on Tuesday approximately midday.  I discussed at length with the patient.  N.p.o. after midnight tonight.   Johnny Bridge, MD Cell [phone]   05/22/2020 8:01 AM

## 2020-05-22 NOTE — Progress Notes (Signed)
PROGRESS NOTE    Christian Miller   XBM:[id number]  DOB: 23-May-1935  DOA: 05/21/2020 PCP: [name], MD   Brief Narrative:  Christian Cella Sr.  is a 85 y.o. male with a past medical history of dementia, overactive bladder, essential hypertension, behavioral disturbances secondary to dementia, hypothyroidism who was in his usual state of health till yesterday when he was in the kitchen and he lost his balance and he fell.  Evaluation at [facility] revealed right hip fracture.  There was no orthopedic coverage at that facility and thus, the patient was transferred to [facility]   Subjective: Pain in right hip when he moves.     Assessment & Plan:   Principal Problem:   Closed right hip fracture  - appreciate ortho eval- plans to undergo surgery tomorrow  Active Problems: Hyponatremia and hypochloremia - give 250 cc of NS and check Bmet  Neuropathy in feet and legs - cont Gabapentin    HTN (hypertension) - cont Amlodipine  Hypothyroidism - cont Synthroid  Overactive bladder - cont alfuzosin    Dementia without behavioral disturbance (HCC) - cont Namenda Depakote, Zyperxa, Zoloft  Time spent in minutes: 35 DVT prophylaxis: lovenox Code Status: full code Family Communication:  Disposition Plan:  Status is: Inpatient  Remains inpatient appropriate because:hip fracture   Dispo: The patient is from: Home              Anticipated d/c is to: SNF              Anticipated d/c date is: 3 days              Patient currently is not medically stable to d/c.      Consultants:   Orthopedic surgery Procedures:    Antimicrobials:  Anti-infectives (From admission, onward)   None       Objective: Vitals:   05/22/20 0155 05/22/20 0543 05/22/20 0548 05/22/20 1100  BP: 117/65 140/64    Pulse: 60 61    Resp: 14 16    Temp: 98.1 F (36.7 C) 99 F (37.2 C)    TempSrc: Oral Oral    SpO2: 92% 90% 94%   Weight:    91.1 kg  Height:    6'  (1.829 m)    Intake/Output Summary (Last 24 hours) at 05/22/2020 1309 Last data filed at 05/22/2020 0900 Gross per 24 hour  Intake 811.33 ml  Output 1350 ml  Net -538.67 ml   Filed Weights   05/22/20 1100  Weight: 91.1 kg    Examination: General exam: Appears comfortable  HEENT: PERRLA, oral mucosa moist, no sclera icterus or thrush Respiratory system: Clear to auscultation. Respiratory effort normal. Cardiovascular system: S1 & S2 heard, RRR.   Gastrointestinal system: Abdomen soft, non-tender, nondistended. Normal bowel sounds. Central nervous system: Alert and oriented. No focal neurological deficits. Extremities: No cyanosis, clubbing - swelling of right hip Skin: No rashes or ulcers Psychiatry:  Mood & affect appropriate.     Data Reviewed: I have personally reviewed following labs and imaging studies  CBC: Recent Labs  Lab 05/22/20 0245  WBC 13.6*  HGB 11.7*  HCT 35.3*  MCV 89.6  PLT 027   Basic Metabolic Panel: Recent Labs  Lab 05/22/20 0245  NA 124*  K 4.0  CL 93*  CO2 23  GLUCOSE 112*  BUN 16  CREATININE 1.01  CALCIUM 8.4*   GFR: Estimated Creatinine Clearance: 61.9 mL/min (by C-G formula based on  SCr of 1.01 mg/dL). Liver Function Tests: No results for input(s): AST, ALT, ALKPHOS, BILITOT, PROT, ALBUMIN in the last 168 hours. No results for input(s): LIPASE, AMYLASE in the last 168 hours. No results for input(s): AMMONIA in the last 168 hours. Coagulation Profile: No results for input(s): INR, PROTIME in the last 168 hours. Cardiac Enzymes: No results for input(s): CKTOTAL, CKMB, CKMBINDEX, TROPONINI in the last 168 hours. BNP (last 3 results) No results for input(s): PROBNP in the last 8760 hours. HbA1C: No results for input(s): HGBA1C in the last 72 hours. CBG: No results for input(s): GLUCAP in the last 168 hours. Lipid Profile: No results for input(s): CHOL, HDL, LDLCALC, TRIG, CHOLHDL, LDLDIRECT in the last 72 hours. Thyroid Function  Tests: No results for input(s): TSH, T4TOTAL, FREET4, T3FREE, THYROIDAB in the last 72 hours. Anemia Panel: No results for input(s): VITAMINB12, FOLATE, FERRITIN, TIBC, IRON, RETICCTPCT in the last 72 hours. Urine analysis:    Component Value Date/Time   COLORURINE YELLOW 07/25/2019 1730   APPEARANCEUR HAZY (A) 07/25/2019 1730   LABSPEC 1.013 07/25/2019 1730   PHURINE 8.0 07/25/2019 1730   GLUCOSEU NEGATIVE 07/25/2019 1730   HGBUR LARGE (A) 07/25/2019 1730   BILIRUBINUR neg 03/15/2020 1643   KETONESUR NEGATIVE 07/25/2019 1730   PROTEINUR Negative 03/15/2020 1643   PROTEINUR 100 (A) 07/25/2019 1730   UROBILINOGEN 0.2 03/15/2020 1643   UROBILINOGEN 0.2 12/06/2012 0812   NITRITE neg 03/15/2020 1643   NITRITE POSITIVE (A) 07/25/2019 1730   LEUKOCYTESUR Small (1+) (A) 03/15/2020 1643   LEUKOCYTESUR LARGE (A) 07/25/2019 1730   Sepsis Labs: @LABRCNTIP (procalcitonin:4,lacticidven:4) )No results found for this or any previous visit (from the past 240 hour(s)).       Radiology Studies: DG CHEST PORT 1 VIEW  Result Date: 05/21/2020 CLINICAL DATA:  Pre-op for hip fracture surgery. Denies any chest complaints today. H/o COPD, HTN. Former smoker. EXAM: PORTABLE CHEST 1 VIEW COMPARISON:  None. FINDINGS: Normal cardiac silhouette. Mild LEFT basilar atelectasis and potential small LEFT effusion. RIGHT lung clear. No pneumothorax. No acute osseous abnormality. IMPRESSION: LEFT basilar atelectasis and small effusion. Electronically Signed   By: Suzy Bouchard M.D.   On: 05/21/2020 13:38   DG HIP UNILAT WITH PELVIS 2-3 VIEWS RIGHT  Result Date: 05/21/2020 CLINICAL DATA:  Known right hip fracture EXAM: DG HIP (WITH OR WITHOUT PELVIS) 2-3V RIGHT; RIGHT FEMUR 2 VIEWS COMPARISON:  05/20/2020 FINDINGS: Redemonstrated impacted subcapital fracture of the right femoral neck. There are small avulsion fracture fragments or vascular calcifications adjacent to the right lesser trochanter. No fracture or  dislocation of the distal right femur. No displaced fracture or dislocation of the pelvis seen in single frontal view. Status post left hip total arthroplasty. IMPRESSION: 1. Redemonstrated impacted subcapital fracture of the right femoral neck. There are small avulsion fracture fragments or vascular calcifications adjacent to the right lesser trochanter without other obvious fracture. 2.  The distal right femur is intact. Electronically Signed   By: Eddie Candle M.D.   On: 05/21/2020 14:09   DG FEMUR, MIN 2 VIEWS RIGHT  Result Date: 05/21/2020 CLINICAL DATA:  Known right hip fracture EXAM: DG HIP (WITH OR WITHOUT PELVIS) 2-3V RIGHT; RIGHT FEMUR 2 VIEWS COMPARISON:  05/20/2020 FINDINGS: Redemonstrated impacted subcapital fracture of the right femoral neck. There are small avulsion fracture fragments or vascular calcifications adjacent to the right lesser trochanter. No fracture or dislocation of the distal right femur. No displaced fracture or dislocation of the pelvis seen in single frontal view.  Status post left hip total arthroplasty. IMPRESSION: 1. Redemonstrated impacted subcapital fracture of the right femoral neck. There are small avulsion fracture fragments or vascular calcifications adjacent to the right lesser trochanter without other obvious fracture. 2.  The distal right femur is intact. Electronically Signed   By: Eddie Candle M.D.   On: 05/21/2020 14:09      Scheduled Meds: . (feeding supplement) PROSource Plus  30 mL Oral Daily  . alfuzosin  10 mg Oral QHS  . amLODipine  2.5 mg Oral Daily  . divalproex  125 mg Oral QHS  . feeding supplement ([employer] STANDARD 1.4)  325 mL Oral Daily  . folic acid  527 mcg Oral QHS  . gabapentin  600 mg Oral TID  . levothyroxine  75 mcg Oral QAC breakfast  . memantine  5 mg Oral BID  . multivitamin with minerals  1 tablet Oral Daily  . OLANZapine  15 mg Oral QHS  . pantoprazole  40 mg Oral Daily  . polyethylene glycol  17 g Oral Daily  .  sertraline  100 mg Oral BID   Continuous Infusions:   LOS: 1 day      Debbe Odea, MD [facility] Pager: [email or web address] 05/22/2020, 1:09 PM

## 2020-05-22 NOTE — TOC Initial Note (Signed)
Transition of Care (TOC) - Initial/Assessment Note    Patient Details  Name: Christian CULLEN Sr. MRN: [id number] Date of Birth: Oct 05, 1935  Transition of [facility]) CM/SW Contact:    Lennart Pall, LCSW Phone Number: 05/22/2020, 12:59 PM  Clinical Narrative:                 Met with pt to review potential dc needs.  Surgery planned for tomorrow.  Both pt and wife note they may need to pursue short term rehab depending on how he does with therapies here.  Wife very supportive and able to provide some assistance.  Notes pt uses walker in the home and that she "oversees" the home and his needs.  Will plan to follow along and assist with SNF vs HH as decisions made.  Expected Discharge Plan: Skilled Nursing Facility Barriers to Discharge: Continued Medical Work up   Patient Goals and CMS Choice Patient states their goals for this hospitalization and ongoing recovery are:: to eventually be able to go home      Expected Discharge Plan and Services Expected Discharge Plan: [place] arrangements for the past 2 months: Single Family Home                                      Prior Living Arrangements/Services Living arrangements for the past 2 months: Single Family Home Lives with:: Spouse Patient language and need for interpreter reviewed:: No Do you feel safe going back to the place where you live?: Yes      Need for Family Participation in Patient Care: Yes (Comment) Care giver support system in place?: Yes (comment)   Criminal Activity/Legal Involvement Pertinent to Current Situation/Hospitalization: No - Comment as needed  Activities of Daily Living Home Assistive Devices/Equipment: Dentures (specify type), Eyeglasses, Blood pressure cuff, Walker (specify type), Hearing aid (teeth implants; bilateral hearing aids) ADL Screening (condition at time of admission) Patient's cognitive ability adequate to safely complete daily activities?: Yes Is the  patient deaf or have difficulty hearing?: Yes (hearing aids) Does the patient have difficulty seeing, even when wearing glasses/contacts?: No Does the patient have difficulty concentrating, remembering, or making decisions?: No Patient able to express need for assistance with ADLs?: Yes Does the patient have difficulty dressing or bathing?: No Independently performs ADLs?: Yes (appropriate for developmental age) Does the patient have difficulty walking or climbing stairs?: Yes Weakness of Legs: Right Weakness of Arms/Hands: None  Permission Sought/Granted Permission sought to share information with : Family Supports Permission granted to share information with : Yes, Verbal Permission Granted  Share Information with NAME: Christian Miller     Permission granted to share info w Relationship: wife  Permission granted to share info w Contact Information: [phone]  Emotional Assessment Appearance:: Appears stated age Attitude/Demeanor/Rapport: Gracious Affect (typically observed): Accepting, Quiet Orientation: : Oriented to Self, Oriented to Place, Oriented to  Time, Oriented to Situation   Psych Involvement: No (comment)  Admission diagnosis:  Closed right hip fracture ([place]) [S72.001A] Patient Active Problem List   Diagnosis Date Noted  . Closed right hip fracture ([place]) 05/21/2020  . Dementia without behavioral disturbance ([place]) 05/21/2020  . OAB (overactive bladder) 12/15/2019  . Gram negative sepsis ([place]) 07/27/2019  . Sepsis due to undetermined organism ([place]) 07/26/2019  . Bladder outlet obstruction 07/26/2019  . Urinary tract infection associated with indwelling urethral catheter (  [place])   . Acute lower UTI 07/25/2019  . Sepsis ([place]) 07/25/2019  . Bilateral carotid artery disease ([place]) 12/08/2012  . Near syncope 12/06/2012  . Lightheadedness 12/06/2012  . Bradycardia 12/06/2012  . HTN (hypertension) 12/06/2012  . Hyponatremia 12/06/2012  . URI (upper respiratory infection)  12/06/2012   PCP:  [name], MD Pharmacy:   [facility], [place] [address] [place] [address] Phone: [phone] Fax: [phone]     Social Determinants of Health (SDOH) Interventions    Readmission Risk Interventions No flowsheet data found.

## 2020-05-22 NOTE — Progress Notes (Signed)
Initial Nutrition Assessment  DOCUMENTATION CODES:   Not applicable  INTERVENTION:  - will order [employer] 1.4 po once/day, each supplement provides 455 kcal and 20 grams protein. - will order 30 mL Prosource Plus once/day, each supplement provides 100 kcal and 15 grams of protein. - will order 1 tablet multivitamin with minerals/day. - weigh patient this AM as he has not been weighed since 03/15/20.  NUTRITION DIAGNOSIS:   Increased nutrient needs related to post-op healing as evidenced by estimated needs.  GOAL:   Patient will meet greater than or equal to 90% of their needs  MONITOR:   PO intake, Supplement acceptance, Labs, Weight trends  REASON FOR ASSESSMENT:   Consult Hip fracture protocol  ASSESSMENT:   85 y.o. male who presented to the ED with acute severe R hip pain after mechanical fall at home.  Pain is worse with movement, better with rest.  He normally walks with a walker at nighttime and with a cane during the daytime. He had a previous L hip fracture with total hip replacement in 1998.  Patient consumed 90% of dinner last night and 75% of breakfast this AM. He ambulates with the help of a cane and walker at home and has no issue getting around when using these devices. He denies any recent changes in appetite or PO intakes. He denies noticing any recent weight changes.  Patient has not been weighed since 03/15/20 at which time he weighed 190 lb and prior to that he was weighed on 12/15/19 when he weighed 195 lb. This indicates 5 lb weight loss (2.5% body weight) in 3 months; not significant for time frame.   Notes indicate plan is for total R hip replacement mid-day tomorrow (8/10) d/t R hip fx. Increased needs based on planned surgery.    Labs reviewed; Na: 124 mmol/l, Cl: 93 mmol/l, Ca: 8.4 mg/dl. Medications reviewed; 0.5 mg folvite/day, 75 mcg oral synthroid/day, 17 g miralax/day.     NUTRITION - FOCUSED PHYSICAL EXAM:  completed; no muscle or fat  depletions; mild edema to RLE.   Diet Order:   Diet Order            Diet Heart Room service appropriate? Yes; Fluid consistency: Thin  Diet effective now                 EDUCATION NEEDS:   No education needs have been identified at this time  Skin:  Skin Assessment: Reviewed RN Assessment  Last BM:  8/6 (PTA, patient report)  Height:   Ht Readings from Last 1 Encounters:  03/15/20 6' (1.829 m)    Weight:   Wt Readings from Last 1 Encounters:  03/15/20 86.6 kg    Estimated Nutritional Needs:  Kcal:  2100-2300 kcal Protein:  105-115 grams Fluid:  >/= 2.2 L/day     Jarome Matin, MS, RD, LDN, CNSC Inpatient Clinical Dietitian RD pager # available in AMION  After hours/weekend pager # available in [facility]

## 2020-05-23 ENCOUNTER — Inpatient Hospital Stay (HOSPITAL_COMMUNITY): Payer: PPO | Admitting: Registered Nurse

## 2020-05-23 ENCOUNTER — Inpatient Hospital Stay (HOSPITAL_COMMUNITY): Payer: PPO

## 2020-05-23 ENCOUNTER — Encounter (HOSPITAL_COMMUNITY): Payer: Self-pay | Admitting: Internal Medicine

## 2020-05-23 ENCOUNTER — Encounter (HOSPITAL_COMMUNITY)
Admission: AD | Disposition: A | Discharge status: Skilled Nursing Facility | Payer: Self-pay | Source: Other Acute Inpatient Hospital | Attending: Internal Medicine

## 2020-05-23 HISTORY — PX: TOTAL HIP ARTHROPLASTY: SHX124

## 2020-05-23 LAB — BASIC METABOLIC PANEL
Anion gap: 7 (ref 5–15)
BUN: 14 mg/dL (ref 8–23)
CO2: 26 mmol/L (ref 22–32)
Calcium: 8.6 mg/dL — ABNORMAL LOW (ref 8.9–10.3)
Chloride: 98 mmol/L (ref 98–111)
Creatinine, Ser: 0.97 mg/dL (ref 0.61–1.24)
GFR calc Af Amer: >60 mL/min (ref >60)
GFR calc non Af Amer: >60 mL/min (ref >60)
Glucose, Bld: 120 mg/dL — ABNORMAL HIGH (ref 70–99)
Potassium: 4.1 mmol/L (ref 3.5–5.1)
Sodium: 131 mmol/L — ABNORMAL LOW (ref 135–145)

## 2020-05-23 LAB — CBC
HCT: 34.8 % — ABNORMAL LOW (ref 39.0–52.0)
Hemoglobin: 11.4 g/dL — ABNORMAL LOW (ref 13.0–17.0)
MCH: 29.5 pg (ref 26.0–34.0)
MCHC: 32.8 g/dL (ref 30.0–36.0)
MCV: 89.9 fL (ref 80.0–100.0)
Platelets: 202 10*3/uL (ref 150–400)
RBC: 3.87 MIL/uL — ABNORMAL LOW (ref 4.22–5.81)
RDW: 12.6 % (ref 11.5–15.5)
WBC: 11.7 10*3/uL — ABNORMAL HIGH (ref 4.0–10.5)
nRBC: 0 % (ref 0.0–0.2)

## 2020-05-23 LAB — MRSA PCR SCREENING: MRSA by PCR: NEGATIVE

## 2020-05-23 LAB — SARS CORONAVIRUS 2 BY RT PCR (HOSPITAL ORDER, PERFORMED IN ~~LOC~~ HOSPITAL LAB): SARS Coronavirus 2: NEGATIVE

## 2020-05-23 SURGERY — ARTHROPLASTY, HIP, TOTAL,POSTERIOR APPROACH
Anesthesia: General | Site: Hip | Laterality: Right

## 2020-05-23 MED ORDER — DOCUSATE SODIUM 100 MG PO CAPS
100.0000 mg | ORAL_CAPSULE | Freq: Two times a day (BID) | ORAL | Status: DC
  Administered 2020-05-23 – 2020-05-27 (×7): 100 mg via ORAL
  Filled 2020-05-23 (×7): qty 1

## 2020-05-23 MED ORDER — FERROUS SULFATE 325 (65 FE) MG PO TABS
325.0000 mg | ORAL_TABLET | Freq: Three times a day (TID) | ORAL | Status: DC
  Administered 2020-05-24 – 2020-05-30 (×18): 325 mg via ORAL
  Filled 2020-05-23 (×18): qty 1

## 2020-05-23 MED ORDER — PROPOFOL 10 MG/ML IV BOLUS
INTRAVENOUS | Status: DC | PRN
  Administered 2020-05-23: 160 mg via INTRAVENOUS

## 2020-05-23 MED ORDER — MEPERIDINE HCL 50 MG/ML IJ SOLN: 6.2500 mg | INTRAMUSCULAR | Status: DC | PRN

## 2020-05-23 MED ORDER — SENNA 8.6 MG PO TABS
1.0000 | ORAL_TABLET | Freq: Two times a day (BID) | ORAL | Status: DC
  Administered 2020-05-23 – 2020-05-26 (×6): 8.6 mg via ORAL
  Filled 2020-05-23 (×6): qty 1

## 2020-05-23 MED ORDER — MORPHINE SULFATE (PF) 2 MG/ML IV SOLN: 0.5000 mg | INTRAVENOUS | Status: DC | PRN

## 2020-05-23 MED ORDER — ENSURE PRE-SURGERY PO LIQD
296.0000 mL | Freq: Once | ORAL | Status: AC
  Administered 2020-05-23: 296 mL via ORAL
  Filled 2020-05-23: qty 296

## 2020-05-23 MED ORDER — ALUM & MAG HYDROXIDE-SIMETH 200-200-20 MG/5ML PO SUSP
30.0000 mL | ORAL | Status: DC | PRN
  Administered 2020-05-24 – 2020-05-25 (×2): 30 mL via ORAL
  Filled 2020-05-23 (×3): qty 30

## 2020-05-23 MED ORDER — EPHEDRINE SULFATE-NACL 50-0.9 MG/10ML-% IV SOSY
PREFILLED_SYRINGE | INTRAVENOUS | Status: DC | PRN
  Administered 2020-05-23 (×4): 10 mg via INTRAVENOUS

## 2020-05-23 MED ORDER — STERILE WATER FOR IRRIGATION IR SOLN
Status: DC | PRN
  Administered 2020-05-23: 2000 mL

## 2020-05-23 MED ORDER — HYDROCODONE-ACETAMINOPHEN 5-325 MG PO TABS
1.0000 | ORAL_TABLET | ORAL | Status: DC | PRN
  Administered 2020-05-23 – 2020-05-24 (×2): 2 via ORAL
  Filled 2020-05-23 (×2): qty 2

## 2020-05-23 MED ORDER — KETOROLAC TROMETHAMINE 30 MG/ML IJ SOLN
INTRAMUSCULAR | Status: AC
  Filled 2020-05-23: qty 1

## 2020-05-23 MED ORDER — ACETAMINOPHEN 325 MG PO TABS
325.0000 mg | ORAL_TABLET | Freq: Four times a day (QID) | ORAL | Status: DC | PRN
  Administered 2020-05-24 – 2020-05-30 (×2): 650 mg via ORAL
  Filled 2020-05-23 (×2): qty 2

## 2020-05-23 MED ORDER — SUCCINYLCHOLINE CHLORIDE 200 MG/10ML IV SOSY
PREFILLED_SYRINGE | INTRAVENOUS | Status: DC | PRN
  Administered 2020-05-23: 120 mg via INTRAVENOUS

## 2020-05-23 MED ORDER — CEFAZOLIN SODIUM-DEXTROSE 2-4 GM/100ML-% IV SOLN
2.0000 g | Freq: Four times a day (QID) | INTRAVENOUS | Status: AC
  Administered 2020-05-23 – 2020-05-24 (×2): 2 g via INTRAVENOUS
  Filled 2020-05-23 (×2): qty 100

## 2020-05-23 MED ORDER — ENOXAPARIN SODIUM 40 MG/0.4ML ~~LOC~~ SOLN
40.0000 mg | SUBCUTANEOUS | Status: DC
  Administered 2020-05-24 – 2020-05-30 (×7): 40 mg via SUBCUTANEOUS
  Filled 2020-05-23 (×7): qty 0.4

## 2020-05-23 MED ORDER — BUPIVACAINE HCL 0.25 % IJ SOLN
INTRAMUSCULAR | Status: AC
  Filled 2020-05-23: qty 1

## 2020-05-23 MED ORDER — ONDANSETRON HCL 4 MG/2ML IJ SOLN
INTRAMUSCULAR | Status: DC | PRN
  Administered 2020-05-23: 4 mg via INTRAVENOUS

## 2020-05-23 MED ORDER — HYDROMORPHONE HCL 1 MG/ML IJ SOLN: 0.2500 mg | INTRAMUSCULAR | Status: DC | PRN

## 2020-05-23 MED ORDER — BISACODYL 10 MG RE SUPP: 10.0000 mg | Freq: Every day | RECTAL | Status: DC | PRN

## 2020-05-23 MED ORDER — SODIUM CHLORIDE 0.9 % IV BOLUS
500.0000 mL | Freq: Once | INTRAVENOUS | Status: AC
  Administered 2020-05-23: 500 mL via INTRAVENOUS

## 2020-05-23 MED ORDER — BUPIVACAINE HCL (PF) 0.25 % IJ SOLN
INTRAMUSCULAR | Status: DC | PRN
  Administered 2020-05-23: 30 mL

## 2020-05-23 MED ORDER — MAGNESIUM CITRATE PO SOLN: 1.0000 | Freq: Once | ORAL | Status: DC | PRN

## 2020-05-23 MED ORDER — POTASSIUM CHLORIDE IN NACL 20-0.45 MEQ/L-% IV SOLN
INTRAVENOUS | Status: DC
  Filled 2020-05-23 (×2): qty 1000

## 2020-05-23 MED ORDER — POLYETHYLENE GLYCOL 3350 17 G PO PACK: 17.0000 g | PACK | Freq: Every day | ORAL | Status: DC | PRN

## 2020-05-23 MED ORDER — FENTANYL CITRATE (PF) 100 MCG/2ML IJ SOLN
INTRAMUSCULAR | Status: DC | PRN
  Administered 2020-05-23 (×2): 50 ug via INTRAVENOUS

## 2020-05-23 MED ORDER — MENTHOL 3 MG MT LOZG: 1.0000 | LOZENGE | OROMUCOSAL | Status: DC | PRN

## 2020-05-23 MED ORDER — ONDANSETRON HCL 4 MG/2ML IJ SOLN: 4.0000 mg | Freq: Four times a day (QID) | INTRAMUSCULAR | Status: DC | PRN

## 2020-05-23 MED ORDER — ALBUMIN HUMAN 5 % IV SOLN
INTRAVENOUS | Status: DC | PRN
Start: 2020-05-23 — End: 2020-05-23

## 2020-05-23 MED ORDER — ROCURONIUM BROMIDE 10 MG/ML (PF) SYRINGE
PREFILLED_SYRINGE | INTRAVENOUS | Status: DC | PRN
  Administered 2020-05-23: 50 mg via INTRAVENOUS

## 2020-05-23 MED ORDER — PHENOL 1.4 % MT LIQD
1.0000 | OROMUCOSAL | Status: DC | PRN
  Administered 2020-05-24: 1 via OROMUCOSAL
  Filled 2020-05-23: qty 177

## 2020-05-23 MED ORDER — HYDROCODONE-ACETAMINOPHEN 7.5-325 MG PO TABS: 1.0000 | ORAL_TABLET | ORAL | Status: DC | PRN

## 2020-05-23 MED ORDER — ONDANSETRON HCL 4 MG/2ML IJ SOLN: 4.0000 mg | Freq: Once | INTRAMUSCULAR | Status: DC | PRN

## 2020-05-23 MED ORDER — ACETAMINOPHEN 500 MG PO TABS
500.0000 mg | ORAL_TABLET | Freq: Four times a day (QID) | ORAL | Status: AC
  Administered 2020-05-24 (×2): 500 mg via ORAL
  Filled 2020-05-23 (×2): qty 1

## 2020-05-23 MED ORDER — LACTATED RINGERS IV SOLN: INTRAVENOUS | Status: DC

## 2020-05-23 MED ORDER — LIDOCAINE 2% (20 MG/ML) 5 ML SYRINGE
INTRAMUSCULAR | Status: DC | PRN
  Administered 2020-05-23: 80 mg via INTRAVENOUS

## 2020-05-23 MED ORDER — 0.9 % SODIUM CHLORIDE (POUR BTL) OPTIME
TOPICAL | Status: DC | PRN
  Administered 2020-05-23: 1000 mL

## 2020-05-23 MED ORDER — ONDANSETRON HCL 4 MG PO TABS: 4.0000 mg | ORAL_TABLET | Freq: Four times a day (QID) | ORAL | Status: DC | PRN

## 2020-05-23 SURGICAL SUPPLY — 63 items
ARTICULEZE HEAD (Hips) ×3 IMPLANT
BIT DRILL 2.0X128 (BIT) ×2 IMPLANT
BIT DRILL 2.0X128MM (BIT) ×1
BLADE SAW SAG 73X25 THK (BLADE) ×2
BLADE SAW SGTL 73X25 THK (BLADE) ×1 IMPLANT
CLOSURE STERI-STRIP 1/2X4 (GAUZE/BANDAGES/DRESSINGS) ×2
CLOSURE WOUND 1/2 X4 (GAUZE/BANDAGES/DRESSINGS) ×1
CLSR STERI-STRIP ANTIMIC 1/2X4 (GAUZE/BANDAGES/DRESSINGS) ×4 IMPLANT
COVER SURGICAL LIGHT HANDLE (MISCELLANEOUS) ×3 IMPLANT
COVER WAND RF STERILE (DRAPES) IMPLANT
CUP ACET PINNACLE SECTR 60MM (Hips) ×1 IMPLANT
DRAPE INCISE IOBAN 66X45 STRL (DRAPES) ×3 IMPLANT
DRAPE ORTHO SPLIT 77X108 STRL (DRAPES) ×6
DRAPE POUCH INSTRU U-SHP 10X18 (DRAPES) ×3 IMPLANT
DRAPE SHEET LG 3/4 BI-LAMINATE (DRAPES) ×3 IMPLANT
DRAPE SURG 17X11 SM STRL (DRAPES) ×3 IMPLANT
DRAPE SURG ORHT 6 SPLT 77X108 (DRAPES) ×2 IMPLANT
DRAPE U-SHAPE 47X51 STRL (DRAPES) ×3 IMPLANT
DRSG MEPILEX BORDER 4X8 (GAUZE/BANDAGES/DRESSINGS) ×3 IMPLANT
DURAPREP 26ML APPLICATOR (WOUND CARE) ×6 IMPLANT
ELECT BLADE TIP CTD 4 INCH (ELECTRODE) ×3 IMPLANT
ELECT REM PT RETURN 15FT ADLT (MISCELLANEOUS) ×3 IMPLANT
ELIMINATOR HOLE APEX DEPUY (Hips) ×3 IMPLANT
FACESHIELD WRAPAROUND (MASK) ×3 IMPLANT
GLOVE BIO SURGEON STRL SZ7 (GLOVE) ×3 IMPLANT
GLOVE BIO SURGEON STRL SZ7.5 (GLOVE) ×3 IMPLANT
GLOVE BIOGEL PI IND STRL 7.0 (GLOVE) ×1 IMPLANT
GLOVE BIOGEL PI IND STRL 8 (GLOVE) ×1 IMPLANT
GLOVE BIOGEL PI INDICATOR 7.0 (GLOVE) ×2
GLOVE BIOGEL PI INDICATOR 8 (GLOVE) ×2
GOWN STRL REUS W/TWL LRG LVL3 (GOWN DISPOSABLE) ×6 IMPLANT
HEAD ARTICULEZE (Hips) ×1 IMPLANT
HOOD PEEL AWAY FLYTE STAYCOOL (MISCELLANEOUS) ×9 IMPLANT
KIT BASIN OR (CUSTOM PROCEDURE TRAY) ×3 IMPLANT
KIT TURNOVER KIT A (KITS) ×3 IMPLANT
LINER NEUTRAL 58X36MM PLUS4 ×3 IMPLANT
MANIFOLD NEPTUNE II (INSTRUMENTS) ×3 IMPLANT
NDL SAFETY ECLIPSE 18X1.5 (NEEDLE) ×2 IMPLANT
NEEDLE HYPO 18GX1.5 SHARP (NEEDLE) ×6
NS IRRIG 1000ML POUR BTL (IV SOLUTION) ×3 IMPLANT
PACK TOTAL JOINT (CUSTOM PROCEDURE TRAY) ×3 IMPLANT
PENCIL SMOKE EVACUATOR (MISCELLANEOUS) IMPLANT
PINNSECTOR W/GRIP ACE CUP 60MM (Hips) ×3 IMPLANT
PROTECTOR NERVE ULNAR (MISCELLANEOUS) ×3 IMPLANT
RETRIEVER SUT HEWSON (MISCELLANEOUS) ×3 IMPLANT
SCREW 6.5MMX25MM (Screw) ×3 IMPLANT
SCREW 6.5MMX30MM (Screw) ×3 IMPLANT
STEM OFFSET DUOFIX SZ6 STD (Stem) ×3 IMPLANT
STRIP CLOSURE SKIN 1/2X4 (GAUZE/BANDAGES/DRESSINGS) ×2 IMPLANT
SUCTION FRAZIER HANDLE 12FR (TUBING) ×3
SUCTION TUBE FRAZIER 12FR DISP (TUBING) ×1 IMPLANT
SUT FIBERWIRE #2 38 REV NDL BL (SUTURE) ×9
SUT VIC AB 0 CT1 36 (SUTURE) ×3 IMPLANT
SUT VIC AB 1 CT1 36 (SUTURE) ×6 IMPLANT
SUT VIC AB 2-0 CT1 27 (SUTURE) ×6
SUT VIC AB 2-0 CT1 TAPERPNT 27 (SUTURE) ×2 IMPLANT
SUT VIC AB 3-0 SH 8-18 (SUTURE) ×3 IMPLANT
SUTURE FIBERWR#2 38 REV NDL BL (SUTURE) ×3 IMPLANT
SYR CONTROL 10ML LL (SYRINGE) ×6 IMPLANT
TOWEL OR 17X26 10 PK STRL BLUE (TOWEL DISPOSABLE) ×3 IMPLANT
TRAY FOLEY MTR SLVR 16FR STAT (SET/KITS/TRAYS/PACK) ×3 IMPLANT
WATER STERILE IRR 1000ML POUR (IV SOLUTION) ×6 IMPLANT
YANKAUER SUCT BULB TIP 10FT TU (MISCELLANEOUS) ×3 IMPLANT

## 2020-05-23 NOTE — Transfer of Care (Signed)
Immediate Anesthesia Transfer of Care Note  Patient: Christian METER Sr.  Procedure(s) Performed: TOTAL HIP ARTHROPLASTY (Right Hip)  Patient Location: PACU  Anesthesia Type:General  Level of Consciousness: awake, alert  and oriented  Airway & Oxygen Therapy: Patient Spontanous Breathing and Patient connected to nasal cannula oxygen  Post-op Assessment: Report given to RN and Post -op Vital signs reviewed and stable  Post vital signs: Reviewed and stable  Last Vitals:  Vitals Value Taken Time  BP 154/55 05/23/20 1600  Temp    Pulse 63 05/23/20 1604  Resp 12 05/23/20 1604  SpO2 99 % 05/23/20 1604  Vitals shown include unvalidated device data.  Last Pain:  Vitals:   05/23/20 1155  TempSrc: Oral  PainSc:       Patients Stated Pain Goal: 3 (41/44/36 0165)  Complications: No complications documented.

## 2020-05-23 NOTE — Discharge Instructions (Signed)
INSTRUCTIONS AFTER JOINT REPLACEMENT   o Remove items at home which could result in a fall. This includes throw rugs or furniture in walking pathways o ICE to the affected joint every three hours while awake for 30 minutes at a time, for at least the first 3-5 days, and then as needed for pain and swelling.  Continue to use ice for pain and swelling. You may notice swelling that will progress down to the foot and ankle.  This is normal after surgery.  Elevate your leg when you are not up walking on it.   o Continue to use the breathing machine you got in the hospital (incentive spirometer) which will help keep your temperature down.  It is common for your temperature to cycle up and down following surgery, especially at night when you are not up moving around and exerting yourself.  The breathing machine keeps your lungs expanded and your temperature down.   DIET:  As you were doing prior to hospitalization, we recommend a well-balanced diet.  DRESSING / WOUND CARE / SHOWERING  You may change your dressing 3-5 days after surgery.  Then change the dressing every day with sterile gauze.  Please use good hand washing techniques before changing the dressing.  Do not use any lotions or creams on the incision until instructed by your surgeon.  ACTIVITY  o Increase activity slowly as tolerated, but follow the weight bearing instructions below.   o No driving for 6 weeks or until further direction given by your physician.  You cannot drive while taking narcotics.  o No lifting or carrying greater than 10 lbs. until further directed by your surgeon. o Avoid periods of inactivity such as sitting longer than an hour when not asleep. This helps prevent blood clots.  o You may return to work once you are authorized by your doctor.     WEIGHT BEARING   Weight bearing as tolerated with assist device (walker, cane, etc) as directed, use it as long as suggested by your surgeon or therapist, typically at  least 4-6 weeks.   EXERCISES  Results after joint replacement surgery are often greatly improved when you follow the exercise, range of motion and muscle strengthening exercises prescribed by your doctor. Safety measures are also important to protect the joint from further injury. Any time any of these exercises cause you to have increased pain or swelling, decrease what you are doing until you are comfortable again and then slowly increase them. If you have problems or questions, call your caregiver or physical therapist for advice.   Rehabilitation is important following a joint replacement. After just a few days of immobilization, the muscles of the leg can become weakened and shrink (atrophy).  These exercises are designed to build up the tone and strength of the thigh and leg muscles and to improve motion. Often times heat used for twenty to thirty minutes before working out will loosen up your tissues and help with improving the range of motion but do not use heat for the first two weeks following surgery (sometimes heat can increase post-operative swelling).   These exercises can be done on a training (exercise) mat, on the floor, on a table or on a bed. Use whatever works the best and is most comfortable for you.    Use music or television while you are exercising so that the exercises are a pleasant break in your day. This will make your life better with the exercises acting as a break   in your routine that you can look forward to.   Perform all exercises about fifteen times, three times per day or as directed.  You should exercise both the operative leg and the other leg as well.  Exercises include:   . Quad Sets - Tighten up the muscle on the front of the thigh (Quad) and hold for 5-10 seconds.   . Straight Leg Raises - With your knee straight (if you were given a brace, keep it on), lift the leg to 60 degrees, hold for 3 seconds, and slowly lower the leg.  Perform this exercise against  resistance later as your leg gets stronger.  . Leg Slides: Lying on your back, slowly slide your foot toward your buttocks, bending your knee up off the floor (only go as far as is comfortable). Then slowly slide your foot back down until your leg is flat on the floor again.  . Angel Wings: Lying on your back spread your legs to the side as far apart as you can without causing discomfort.  . Hamstring Strength:  Lying on your back, push your heel against the floor with your leg straight by tightening up the muscles of your buttocks.  Repeat, but this time bend your knee to a comfortable angle, and push your heel against the floor.  You may put a pillow under the heel to make it more comfortable if necessary.   A rehabilitation program following joint replacement surgery can speed recovery and prevent re-injury in the future due to weakened muscles. Contact your doctor or a physical therapist for more information on knee rehabilitation.    CONSTIPATION  Constipation is defined medically as fewer than three stools per week and severe constipation as less than one stool per week.  Even if you have a regular bowel pattern at home, your normal regimen is likely to be disrupted due to multiple reasons following surgery.  Combination of anesthesia, postoperative narcotics, change in appetite and fluid intake all can affect your bowels.   YOU MUST use at least one of the following options; they are listed in order of increasing strength to get the job done.  They are all available over the counter, and you may need to use some, POSSIBLY even all of these options:    Drink plenty of fluids (prune juice may be helpful) and high fiber foods Colace 100 mg by mouth twice a day  Senokot for constipation as directed and as needed Dulcolax (bisacodyl), take with full glass of water  Miralax (polyethylene glycol) once or twice a day as needed.  If you have tried all these things and are unable to have a bowel  movement in the first 3-4 days after surgery call either your surgeon or your primary doctor.    If you experience loose stools or diarrhea, hold the medications until you stool forms back up.  If your symptoms do not get better within 1 week or if they get worse, check with your doctor.  If you experience "the worst abdominal pain ever" or develop nausea or vomiting, please contact the office immediately for further recommendations for treatment.   ITCHING:  If you experience itching with your medications, try taking only a single pain pill, or even half a pain pill at a time.  You can also use Benadryl over the counter for itching or also to help with sleep.   TED HOSE STOCKINGS:  Use stockings on both legs until for at least 2 weeks or as   directed by physician office. They may be removed at night for sleeping.  MEDICATIONS:  See your medication summary on the "After Visit Summary" that nursing will review with you.  You may have some home medications which will be placed on hold until you complete the course of blood thinner medication.  It is important for you to complete the blood thinner medication as prescribed.  PRECAUTIONS:  If you experience chest pain or shortness of breath - call 911 immediately for transfer to the hospital emergency department.   If you develop a fever greater that 101 F, purulent drainage from wound, increased redness or drainage from wound, foul odor from the wound/dressing, or calf pain - CONTACT YOUR SURGEON.                                                   FOLLOW-UP APPOINTMENTS:  If you do not already have a post-op appointment, please call the office for an appointment to be seen by your surgeon.  Guidelines for how soon to be seen are listed in your "After Visit Summary", but are typically between 1-4 weeks after surgery.  OTHER INSTRUCTIONS:   Knee Replacement:  Do not place pillow under knee, focus on keeping the knee straight while resting. CPM  instructions: 0-90 degrees, 2 hours in the morning, 2 hours in the afternoon, and 2 hours in the evening. Place foam block, curve side up under heel at all times except when in CPM or when walking.  DO NOT modify, tear, cut, or change the foam block in any way.   DENTAL ANTIBIOTICS:  In most cases prophylactic antibiotics for Dental procdeures after total joint surgery are not necessary.  Exceptions are as follows:  1. History of prior total joint infection  2. Severely immunocompromised (Organ Transplant, cancer chemotherapy, Rheumatoid biologic meds such as Humera)  3. Poorly controlled diabetes (A1C &gt; 8.0, blood glucose over 200)  If you have one of these conditions, contact your surgeon for an antibiotic prescription, prior to your dental procedure.   MAKE SURE YOU:  . Understand these instructions.  . Get help right away if you are not doing well or get worse.    Thank you for letting us be a part of your medical care team.  It is a privilege we respect greatly.  We hope these instructions will help you stay on track for a fast and full recovery!   

## 2020-05-23 NOTE — Anesthesia Procedure Notes (Signed)
Procedure Name: Intubation Date/Time: 05/23/2020 1:15 PM Performed by: Lillyana Majette D, CRNA Pre-anesthesia Checklist: Patient identified, Emergency Drugs available, Suction available and Patient being monitored Patient Re-evaluated:Patient Re-evaluated prior to induction Oxygen Delivery Method: Circle system utilized Preoxygenation: Pre-oxygenation with 100% oxygen Induction Type: IV induction Ventilation: Mask ventilation without difficulty Laryngoscope Size: Mac and 4 Grade View: Grade I Tube type: Oral Tube size: 7.5 mm Number of attempts: 1 Airway Equipment and Method: Stylet Placement Confirmation: ETT inserted through vocal cords under direct vision,  positive ETCO2 and breath sounds checked- equal and bilateral Secured at: 22 cm Tube secured with: Tape Dental Injury: Teeth and Oropharynx as per pre-operative assessment

## 2020-05-23 NOTE — Progress Notes (Addendum)
PROGRESS NOTE    Christian Miller   DGU:[id number]  DOB: [birth year]  DOA: 05/21/2020 PCP: Christian Bis, MD   Brief Narrative:  Christian Cella Sr.  is a 85 y.o. male with a past medical history of dementia, overactive bladder, essential hypertension, behavioral disturbances secondary to dementia, hypothyroidism who was in his usual state of health till yesterday when he was in the kitchen and he lost his balance and he fell.  Evaluation at [facility] revealed right hip fracture.  There was no orthopedic coverage at that facility and thus, the patient was transferred to [facility].   Subjective: He has no new complaints.     Assessment & Plan:   Principal Problem:   Closed right hip fracture  - appreciate ortho eval- he will undergo surgery today  Active Problems: Hyponatremia and hypochloremia - likely poor oral intake - 8/9- sodium 124- administered 250 cc of NS and checked Bmet- now 131- I'll order another 250 cc of NS today - follow  Neuropathy in feet and legs - cont Gabapentin    HTN (hypertension) - cont Amlodipine  Hypothyroidism - cont Synthroid  Overactive bladder - cont alfuzosin    Dementia without behavioral disturbance (HCC) - cont Namenda Depakote, Zyperxa, Zoloft  Time spent in minutes: 35 DVT prophylaxis: lovenox Code Status: full code Family Communication:  Disposition Plan:  Status is: Inpatient  Remains inpatient appropriate because:hip fracture   Dispo: The patient is from: Home              Anticipated d/c is to: SNF              Anticipated d/c date is: 3 days              Patient currently is not medically stable to d/c.      Consultants:   Orthopedic surgery Procedures:    Antimicrobials:  Anti-infectives (From admission, onward)   Start     Dose/Rate Route Frequency Ordered Stop   05/23/20 0600  ceFAZolin (ANCEF) IVPB 2g/100 mL premix        2 g 200 mL/hr over 30 Minutes Intravenous On call to O.R.  05/22/20 2356 05/23/20 1317       Objective: Vitals:   05/22/20 2114 05/23/20 0204 05/23/20 0535 05/23/20 1155  BP: 124/67 (!) 145/68 (!) 144/71 (!) 142/63  Pulse: 63 (!) 56 (!) 57 (!) 59  Resp: 16 16 16 16   Temp: 98.1 F (36.7 C) 98.1 F (36.7 C) 97.9 F (36.6 C) 98 F (36.7 C)  TempSrc: Oral Oral Oral Oral  SpO2: 95% 96% 95% 91%  Weight:      Height:        Intake/Output Summary (Last 24 hours) at 05/23/2020 1444 Last data filed at 05/23/2020 1440 Gross per 24 hour  Intake 1870 ml  Output 2175 ml  Net -305 ml   Filed Weights   05/22/20 1100  Weight: 91.1 kg    Examination: General exam: Appears comfortable  HEENT: PERRLA, oral mucosa moist, no sclera icterus or thrush Respiratory system: Clear to auscultation. Respiratory effort normal. Cardiovascular system: S1 & S2 heard,  No murmurs  Gastrointestinal system: Abdomen soft, non-tender, nondistended. Normal bowel sounds   Central nervous system: Alert and oriented. No focal neurological deficits. Extremities: No cyanosis, clubbing - has right hip edema Skin: No rashes or ulcers Psychiatry:  Mood & affect appropriate.     Data Reviewed: I have personally reviewed following labs and  imaging studies  CBC: Recent Labs  Lab 05/22/20 0245 05/23/20 0311  WBC 13.6* 11.7*  HGB 11.7* 11.4*  HCT 35.3* 34.8*  MCV 89.6 89.9  PLT 229 220   Basic Metabolic Panel: Recent Labs  Lab 05/22/20 0245 05/22/20 1405 05/23/20 0311  NA 124* 134* 131*  K 4.0 4.5 4.1  CL 93* 98 98  CO2 23 27 26   GLUCOSE 112* 135* 120*  BUN 16 14 14   CREATININE 1.01 1.20 0.97  CALCIUM 8.4* 8.9 8.6*   GFR: Estimated Creatinine Clearance: 64.4 mL/min (by C-G formula based on SCr of 0.97 mg/dL). Liver Function Tests: No results for input(s): AST, ALT, ALKPHOS, BILITOT, PROT, ALBUMIN in the last 168 hours. No results for input(s): LIPASE, AMYLASE in the last 168 hours. No results for input(s): AMMONIA in the last 168 hours. Coagulation  Profile: No results for input(s): INR, PROTIME in the last 168 hours. Cardiac Enzymes: No results for input(s): CKTOTAL, CKMB, CKMBINDEX, TROPONINI in the last 168 hours. BNP (last 3 results) No results for input(s): PROBNP in the last 8760 hours. HbA1C: No results for input(s): HGBA1C in the last 72 hours. CBG: No results for input(s): GLUCAP in the last 168 hours. Lipid Profile: No results for input(s): CHOL, HDL, LDLCALC, TRIG, CHOLHDL, LDLDIRECT in the last 72 hours. Thyroid Function Tests: No results for input(s): TSH, T4TOTAL, FREET4, T3FREE, THYROIDAB in the last 72 hours. Anemia Panel: No results for input(s): VITAMINB12, FOLATE, FERRITIN, TIBC, IRON, RETICCTPCT in the last 72 hours. Urine analysis:    Component Value Date/Time   COLORURINE YELLOW 07/25/2019 1730   APPEARANCEUR HAZY (A) 07/25/2019 1730   LABSPEC 1.013 07/25/2019 1730   PHURINE 8.0 07/25/2019 1730   GLUCOSEU NEGATIVE 07/25/2019 1730   HGBUR LARGE (A) 07/25/2019 1730   BILIRUBINUR neg 03/15/2020 1643   KETONESUR NEGATIVE 07/25/2019 1730   PROTEINUR Negative 03/15/2020 1643   PROTEINUR 100 (A) 07/25/2019 1730   UROBILINOGEN 0.2 03/15/2020 1643   UROBILINOGEN 0.2 12/06/2012 0812   NITRITE neg 03/15/2020 1643   NITRITE POSITIVE (A) 07/25/2019 1730   LEUKOCYTESUR Small (1+) (A) 03/15/2020 1643   LEUKOCYTESUR LARGE (A) 07/25/2019 1730   Sepsis Labs: @LABRCNTIP (procalcitonin:4,lacticidven:4) ) Recent Results (from the past 240 hour(s))  SARS Coronavirus 2 by RT PCR (hospital order, performed in [place] hospital lab) Nasopharyngeal Nasal Mucosa     Status: None   Collection Time: 05/22/20  2:00 AM   Specimen: Nasal Mucosa; Nasopharyngeal  Result Value Ref Range Status   SARS Coronavirus 2 NEGATIVE NEGATIVE Final    Comment: (NOTE) SARS-CoV-2 target nucleic acids are NOT DETECTED.  The SARS-CoV-2 RNA is generally detectable in upper and lower respiratory specimens during the acute phase of  infection. The lowest concentration of SARS-CoV-2 viral copies this assay can detect is 250 copies / mL. A negative result does not preclude SARS-CoV-2 infection and should not be used as the sole basis for treatment or other patient management decisions.  A negative result may occur with improper specimen collection / handling, submission of specimen other than nasopharyngeal swab, presence of viral mutation(s) within the areas targeted by this assay, and inadequate number of viral copies (<250 copies / mL). A negative result must be combined with clinical observations, patient history, and epidemiological information.  Fact Sheet for Patients:   StrictlyIdeas.no  Fact Sheet for Healthcare Providers: BankingDealers.co.za  This test is not yet approved or  cleared by the [place] FDA and has been authorized for detection and/or diagnosis of  SARS-CoV-2 by FDA under an Emergency Use Authorization (EUA).  This EUA will remain in effect (meaning this test can be used) for the duration of the COVID-19 declaration under Section 564(b)(1) of the Act, 21 U.S.C. section 360bbb-3(b)(1), unless the authorization is terminated or revoked sooner.  Performed at [facility], [place] [address], [place], [place] [address]   MRSA PCR Screening     Status: None   Collection Time: 05/23/20 12:31 AM   Specimen: Nasal Mucosa; Nasopharyngeal  Result Value Ref Range Status   MRSA by PCR NEGATIVE NEGATIVE Final    Comment:        The GeneXpert MRSA Assay (FDA approved for NASAL specimens only), is one component of a comprehensive MRSA colonization surveillance program. It is not intended to diagnose MRSA infection nor to guide or monitor treatment for MRSA infections. Performed at [facility], [place] [address], [place], [place] [address]          Radiology Studies: No results found.    Scheduled  Meds: . [MAR Hold] (feeding supplement) PROSource Plus  30 mL Oral Daily  . [MAR Hold] alfuzosin  10 mg Oral QHS  . [MAR Hold] amLODipine  2.5 mg Oral Daily  . chlorhexidine  60 mL Topical Once  . [MAR Hold] divalproex  125 mg Oral QHS  . [MAR Hold] feeding supplement ([employer] STANDARD 1.4)  325 mL Oral Daily  . [MAR Hold] folic acid  967 mcg Oral QHS  . [MAR Hold] gabapentin  600 mg Oral TID  . [MAR Hold] levothyroxine  75 mcg Oral QAC breakfast  . [MAR Hold] memantine  5 mg Oral BID  . [MAR Hold] multivitamin with minerals  1 tablet Oral Daily  . [MAR Hold] OLANZapine  15 mg Oral QHS  . [MAR Hold] pantoprazole  40 mg Oral Daily  . [MAR Hold] polyethylene glycol  17 g Oral Daily  . [MAR Hold] sertraline  100 mg Oral BID   Continuous Infusions: . lactated ringers 50 mL/hr at 05/23/20 1305     LOS: 2 days      Debbe Odea, MD [facility] Pager: [email or web address] 05/23/2020, 2:44 PM

## 2020-05-23 NOTE — Progress Notes (Signed)
The patient has been re-examined, and the chart reviewed, and there have been no interval changes to the documented history and physical.    The risks benefits and alternatives were discussed with the patient including but not limited to the risks of nonoperative treatment, versus surgical intervention including infection, bleeding, nerve injury, periprosthetic fracture, the need for revision surgery, dislocation, leg length discrepancy, blood clots, cardiopulmonary complications, morbidity, mortality, among others, and they were willing to proceed.    Johnny Bridge, MD

## 2020-05-23 NOTE — Op Note (Signed)
05/21/2020 - 05/23/2020  3:25 PM  PATIENT:  Christian Cella Sr.   MRN: [id number]  PRE-OPERATIVE DIAGNOSIS: Displaced right femoral neck fracture  POST-OPERATIVE DIAGNOSIS:  same  PROCEDURE:  Procedure(s): TOTAL HIP ARTHROPLASTY  PREOPERATIVE INDICATIONS:    Christian Miller. is an 85 y.o. male who has a diagnosis of displaced right femoral neck fracture and elected for surgical management after failing conservative treatment.  The risks benefits and alternatives were discussed with the patient including but not limited to the risks of nonoperative treatment, versus surgical intervention including infection, bleeding, nerve injury, periprosthetic fracture, the need for revision surgery, dislocation, leg length discrepancy, blood clots, cardiopulmonary complications, morbidity, mortality, among others, and they were willing to proceed.     OPERATIVE REPORT     SURGEON:  Marchia Bond, MD    ASSISTANT:  Merlene Pulling, PA-C, (Present throughout the entire procedure,  necessary for completion of procedure in a timely manner, assisting with retraction, instrumentation, and closure)     ANESTHESIA: General  ESTIMATED BLOOD LOSS: 536 mL    COMPLICATIONS:  None.     UNIQUE ASPECTS OF THE CASE: The femoral head was a 43, extremely large.  The femoral canal reamed to a size 6, and was very difficult to get to a 7, in fact I was not able to get to a 7.  Despite this, the 6 broach was slightly rotationally unstable proximally after trialing, and it countersunk slightly.  The real prosthesis had excellent rotational stability, and was countersunk compared to the posterior portion, although the anterior portion had a little bit of defect from the fracture, and it was not fully to that level.  I debated about using a high offset stem, although the soft tissues did not accommodate a high offset stem.  The acetabulum had some mild degenerative changes noted, but no full-thickness chondral loss.  I went to a  +5 on the neck length, and the leg lengths felt equal.  He was stable to about 45 degrees of internal rotation with the hip flexed to 90 degrees, I debated placing a posterior lipped liner, using high offset, both of which increased stability, but I did not feel that they were necessary, as ultimately I had satisfactory clinical stability.  There were a lot of in between questions on this case, whether to use a lipped liner, whether to countersink and calcar plane, whether to try to get to a size 7 on the femur, whether to go with a high offset, ultimately I made all of the best decisions that I could given his relatively poor bone quality.  The acetabulum had a fairly stout rim fit, I did not ream line to line, and may have been slightly up off of the back wall, but felt stable fixation.  At first I placed a 25 mm screw, but this did not really give much purchase, I then went to a 30, which also did not feel great, but at least the screw held when trying to remove the screwdriver.  The stout fixation on the rim fit felt adequate however.  COMPONENTS:  [employer] fit femur size 6 with a 36 mm + 5 metallic head ball and a Gription Acetabular shell size 60, with a single cancellous screw for backup fixation, with an apex hole eliminator and a +4 neutral polyethylene liner.    PROCEDURE IN DETAIL:   The patient was met in the holding area and  identified.  The appropriate hip was  identified and marked at the operative site.  The patient was then transported to the OR  and  placed under anesthesia.  At that point, the patient was  placed in the lateral decubitus position with the operative side up and  secured to the operating room table and all bony prominences padded.     The operative lower extremity was prepped from the iliac crest to the distal leg.  Sterile draping was performed.  Time out was performed prior to incision.      A routine posterolateral approach was utilized via sharp  dissection  carried down to the subcutaneous tissue.  Gross bleeders were Bovie coagulated.  The iliotibial band was identified and incised along the length of the skin incision.  Self-retaining retractors were  inserted.  With the hip internally rotated, the short external rotators  were identified. The piriformis and capsule was tagged with FiberWire, and the hip capsule released in a T-type fashion.  The femoral neck was exposed, and I resected the femoral neck using the appropriate jig. This was performed at approximately a thumb's breadth above the lesser trochanter.    I then exposed the deep acetabulum, cleared out any tissue including the ligamentum teres.  A wing retractor was placed.  After adequate visualization, I excised the labrum, and then sequentially reamed.  The acetabulum was extremely large, I reamed to a 59.  I placed the trial acetabulum, which seated adequately, and then impacted the real cup into place.  Appropriate version and inclination was confirmed clinically matching their bony anatomy, and also with the use of the jig.  I had somewhat of a rim fit, but it felt stable.  I placed a cancellous screw to augment fixation.  Initially I tried to place a 25 screw, but it did not hold, and in fact pulled out with removal of the screwdriver, so then I placed a 30, which gave slightly better purchase, and at least the screw held.  A trial polyethylene liner was placed and the wing retractor removed.    I then prepared the proximal femur using the cookie-cutter, the lateralizing reamer, and then sequentially reamed and broached.  I reamed to a size 6, which was fairly tight.  I then broached to a size 6, which also felt appropriate.  A trial broach, neck, and head was utilized, and I reduced the hip.  Initially, I trialed with a +1.5, standard offset, and this felt short, and unstable to internal rotation.  Then I went to a high offset +5, which had extremely good stability, but the soft  tissues did not reduce to the femur.  Then I trialed with a +5 standard offset, which had good reduction of the tissues, as well as restoration of the leg lengths, and felt like the best configuration.  After dislocation, and the broach was slightly unstable to rotation, and exactly flush with the cut posteriorly, although it was still somewhat high anteriorly given the damage to the neck.  I did not think that I could get up to a size 7, so I selected the above named components.  The trial components were then removed, and the real polyethylene liner was placed.  I then impacted the real femoral prosthesis into place into the appropriate version, slightly anteverted to the normal anatomy, and I impacted the real head ball into place.  The real femoral component countersunk slightly, but did ultimately find its home and felt stable, the portal cord was about 1 mm recessed below  the posterior cut line.    The hip was then reduced and taken through functional range of motion and found to have appropriate stability.  The hip began to become unstable at about 45 degrees of internal rotation with the hip flexed to 90 degrees at neutral abduction.  Leg lengths were restored.  I then used a 2 mm drill bits to pass the FiberWire suture from the capsule and piriformis through the greater trochanter, and secured this. Excellent posterior capsular repair was achieved. I also closed the T in the capsule.  I then irrigated the hip copiously again with pulse lavage, and repaired the fascia with Vicryl, followed by Vicryl for the subcutaneous tissue, Monocryl for the skin, Steri-Strips and sterile gauze. The wounds were injected. The patient was then awakened and returned to PACU in stable and satisfactory condition. There were no complications.  Marchia Bond, MD Orthopedic Surgeon [phone]   05/23/2020 3:25 PM

## 2020-05-23 NOTE — Anesthesia Preprocedure Evaluation (Signed)
Anesthesia Evaluation  Patient identified by MRN, date of birth, ID band Patient awake    Reviewed: Allergy & Precautions, NPO status , Patient's Chart, lab work & pertinent test results  History of Anesthesia Complications (+) PONV  Airway Mallampati: I  TM Distance: >3 FB Neck ROM: Full    Dental   Pulmonary former smoker,    Pulmonary exam normal        Cardiovascular hypertension, Pt. on medications Normal cardiovascular exam     Neuro/Psych Anxiety Depression Dementia    GI/Hepatic GERD  Medicated and Controlled,  Endo/Other    Renal/GU      Musculoskeletal   Abdominal   Peds  Hematology   Anesthesia Other Findings   Reproductive/Obstetrics                             Anesthesia Physical Anesthesia Plan  ASA: III  Anesthesia Plan: General   Post-op Pain Management:    Induction: Intravenous  PONV Risk Score and Plan: 3 and Ondansetron and Treatment may vary due to age or medical condition  Airway Management Planned: Oral ETT  Additional Equipment:   Intra-op Plan:   Post-operative Plan: Extubation in OR  Informed Consent: I have reviewed the patients History and Physical, chart, labs and discussed the procedure including the risks, benefits and alternatives for the proposed anesthesia with the patient or authorized representative who has indicated his/her understanding and acceptance.       Plan Discussed with: CRNA and Surgeon  Anesthesia Plan Comments:         Anesthesia Quick Evaluation

## 2020-05-23 NOTE — Plan of Care (Signed)
  Problem: Clinical Measurements: Goal: Respiratory complications will improve Outcome: Progressing   Problem: Clinical Measurements: Goal: Cardiovascular complication will be avoided Outcome: Progressing   Problem: Pain Managment: Goal: General experience of comfort will improve Outcome: Progressing   Problem: Activity: Goal: Ability to ambulate and perform ADLs will improve Outcome: Progressing   Problem: Skin Integrity: Goal: Risk for impaired skin integrity will decrease Outcome: Progressing   Problem: Health Behavior/Discharge Planning: Goal: Ability to manage health-related needs will improve Outcome: Progressing   Problem: Skin Integrity: Goal: Risk for impaired skin integrity will decrease Outcome: Progressing

## 2020-05-23 NOTE — Anesthesia Postprocedure Evaluation (Signed)
Anesthesia Post Note  Patient: Christian NOVELO Sr.  Procedure(s) Performed: TOTAL HIP ARTHROPLASTY (Right Hip)     Patient location during evaluation: PACU Anesthesia Type: General Level of consciousness: awake and alert Pain management: pain level controlled Vital Signs Assessment: post-procedure vital signs reviewed and stable Respiratory status: spontaneous breathing, nonlabored ventilation, respiratory function stable and patient connected to nasal cannula oxygen Cardiovascular status: blood pressure returned to baseline and stable Postop Assessment: no apparent nausea or vomiting Anesthetic complications: no   No complications documented.  Last Vitals:  Vitals:   05/23/20 1600 05/23/20 1615  BP: (!) 154/55 (!) 122/53  Pulse: 72 62  Resp: 13 12  Temp:    SpO2: 96% 92%    Last Pain:  Vitals:   05/23/20 1155  TempSrc: Oral  PainSc:                  Siham Bucaro DAVID

## 2020-05-24 ENCOUNTER — Encounter (HOSPITAL_COMMUNITY): Payer: Self-pay | Admitting: Orthopedic Surgery

## 2020-05-24 LAB — BASIC METABOLIC PANEL WITH GFR
Anion gap: 10 (ref 5–15)
BUN: 14 mg/dL (ref 8–23)
CO2: 23 mmol/L (ref 22–32)
Calcium: 8.2 mg/dL — ABNORMAL LOW (ref 8.9–10.3)
Chloride: 99 mmol/L (ref 98–111)
Creatinine, Ser: 1.15 mg/dL (ref 0.61–1.24)
GFR calc Af Amer: >60 mL/min (ref >60)
GFR calc non Af Amer: 59 mL/min — ABNORMAL LOW (ref >60)
Glucose, Bld: 127 mg/dL — ABNORMAL HIGH (ref 70–99)
Potassium: 4.6 mmol/L (ref 3.5–5.1)
Sodium: 132 mmol/L — ABNORMAL LOW (ref 135–145)

## 2020-05-24 LAB — CBC
HCT: 31.5 % — ABNORMAL LOW (ref 39.0–52.0)
Hemoglobin: 10 g/dL — ABNORMAL LOW (ref 13.0–17.0)
MCH: 29.6 pg (ref 26.0–34.0)
MCHC: 31.7 g/dL (ref 30.0–36.0)
MCV: 93.2 fL (ref 80.0–100.0)
Platelets: 227 10*3/uL (ref 150–400)
RBC: 3.38 MIL/uL — ABNORMAL LOW (ref 4.22–5.81)
RDW: 12.9 % (ref 11.5–15.5)
WBC: 15.3 10*3/uL — ABNORMAL HIGH (ref 4.0–10.5)
nRBC: 0 % (ref 0.0–0.2)

## 2020-05-24 MED ORDER — SODIUM CHLORIDE 0.9 % IV SOLN: INTRAVENOUS | Status: DC

## 2020-05-24 NOTE — Care Management Important Message (Signed)
Important Message  Patient Details IM Letter given to the Patient Name: Christian ORGERON Sr. MRN: [id number] Date of Birth: 1935/04/13   Medicare Important Message Given:  Yes     [name] 05/24/2020, 10:41 AM

## 2020-05-24 NOTE — Progress Notes (Signed)
Pt visible tremors continue to be present when pt is awake. They are not present when pt is resting with eyes closed. Pt also remains confused at times to place and situation. Rn will continue to monitor.

## 2020-05-24 NOTE — Progress Notes (Signed)
Subjective: 1 Day Post-Op s/p Procedure(s): TOTAL HIP ARTHROPLASTY  Reports right hip as moderately sore. Denies Denies chest pain, SOB, Calf pain. No nausea/vomiting. No other complaints.   Objective:  PE: VITALS:   Vitals:   05/23/20 2025 05/23/20 2250 05/24/20 0112 05/24/20 0512  BP: (!) 146/76 120/60 118/71 (!) 107/50  Pulse: 64 68 79 75  Resp: 18 18 17 17   Temp: 97.7 F (36.5 C) 97.9 F (36.6 C) 97.7 F (36.5 C) 98.3 F (36.8 C)  TempSrc: Oral Oral Oral Oral  SpO2: 97% 97% 97% 96%  Weight:      Height:       General: Alert, oriented to person, place, and time ABD soft Neurovascular intact Sensation intact distally Intact pulses distally Dorsiflexion/Plantar flexion intact Incision: no drainage  LABS  Results for orders placed or performed during the hospital encounter of 05/21/20 (from the past 24 hour(s))  Basic metabolic panel     Status: Abnormal   Collection Time: 05/24/20  2:26 AM  Result Value Ref Range   Sodium 132 (L) 135 - 145 mmol/L   Potassium 4.6 3.5 - 5.1 mmol/L   Chloride 99 98 - 111 mmol/L   CO2 23 22 - 32 mmol/L   Glucose, Bld 127 (H) 70 - 99 mg/dL   BUN 14 8 - 23 mg/dL   Creatinine, Ser 1.15 0.61 - 1.24 mg/dL   Calcium 8.2 (L) 8.9 - 10.3 mg/dL   GFR calc non Af Amer 59 (L) >60 mL/min   GFR calc Af Amer >60 >60 mL/min   Anion gap 10 5 - 15  CBC     Status: Abnormal   Collection Time: 05/24/20  2:26 AM  Result Value Ref Range   WBC 15.3 (H) 4.0 - 10.5 K/uL   RBC 3.38 (L) 4.22 - 5.81 MIL/uL   Hemoglobin 10.0 (L) 13.0 - 17.0 g/dL   HCT 31.5 (L) 39 - 52 %   MCV 93.2 80.0 - 100.0 fL   MCH 29.6 26.0 - 34.0 pg   MCHC 31.7 30.0 - 36.0 g/dL   RDW 12.9 11.5 - 15.5 %   Platelets 227 150 - 400 K/uL   nRBC 0.0 0.0 - 0.2 %    Pelvis Portable  Result Date: 05/23/2020 CLINICAL DATA:  Status post ORIF right hip. EXAM: PORTABLE PELVIS 1-2 VIEWS COMPARISON:  Concurrent postoperative hip exam. FINDINGS: Right hip arthroplasty in expected  alignment. No periprosthetic lucency. Distal aspect of the femoral stem is included on concurrent hip exam. Recent postsurgical change includes air and edema in the joint space and soft tissues. Left hip arthroplasty is intact were visualized. IMPRESSION: Post right hip arthroplasty without immediate postoperative complication. Electronically Signed   By: Keith Rake M.D.   On: 05/23/2020 17:34   DG Hip Port Unilat With Pelvis 1V Right  Result Date: 05/23/2020 CLINICAL DATA:  Postop. EXAM: DG HIP (WITH OR WITHOUT PELVIS) 1V PORT RIGHT COMPARISON:  Preoperative radiograph 05/21/2020 FINDINGS: Right hip arthroplasty in expected alignment. No periprosthetic lucency or fracture. Recent postsurgical change includes air and edema in the joint space and soft tissues. IMPRESSION: Right hip arthroplasty without immediate postoperative complication. Electronically Signed   By: Keith Rake M.D.   On: 05/23/2020 17:27    Assessment/Plan: Closed Right hip fracture -  1 Day Post-Op s/p Procedure(s): TOTAL HIP ARTHROPLASTY  Weightbearing: WBAT RLE - up with therapy, posterior hip precautions Insicional and dressing care: Reinforce dressings as needed VTE prophylaxis: lovenox Pain  control: continue current regimen, limit opioids as much as possible Follow - up plan: 2 weeks with Dr. Mardelle Matte Dispo: patient lives at home with wife, pending PT eval  Contact information:   Weekdays 8-5 Merlene Pulling, [place] [phone] A fter hours and holidays please check [email or web address] for group call information for [facility]  Ventura Bruns 05/24/2020, 8:36 AM

## 2020-05-24 NOTE — Evaluation (Signed)
Physical Therapy Evaluation Patient Details Name: Christian POPLASKI Sr. MRN: [id number] DOB: November 13, 1934 Today's Date: 05/24/2020   History of Present Illness  85 y.o. male with a past medical history of dementia, overactive bladder, essential hypertension, behavioral disturbances secondary to dementia, hypothyroidism and admitted with displaced right femoral neck fracture and s/p R THA with posterior approach 05/23/20  Clinical Impression  Patient is s/p above surgery resulting in functional limitations due to the deficits listed below (see PT Problem List).  Patient will benefit from skilled PT to increase their independence and safety with mobility to allow discharge to the venue listed below.  Spouse reports pt with neuropathy in legs and tends to move them frequently so placed pillow between legs end of session to assist with maintaining precautions.  Pt with very slow, effortful STIFF movement which spouse reports is not his baseline.  Pt requiring increased assist for mobility at this time and would benefit from post acute rehab.      Follow Up Recommendations CIR;Supervision/Assistance - 24 hour    Equipment Recommendations  Other (comment) (per next venue)    Recommendations for Other Services       Precautions / Restrictions Precautions Precautions: Posterior Hip;Fall Precaution Comments: reviewed posterior hip precautions with spouse and pt, handout in room Restrictions Weight Bearing Restrictions: Yes RLE Weight Bearing: Weight bearing as tolerated      Mobility  Bed Mobility Overal bed mobility: Needs Assistance Bed Mobility: Supine to Sit;Sit to Supine     Supine to sit: Total assist Sit to supine: Total assist   General bed mobility comments: multimodal cues for technique, increased time and effort however pt unable to physically assist  Transfers Overall transfer level: Needs assistance Equipment used: Rolling walker (2 wheeled) Transfers: Sit to/from Stand Sit  to Stand: Mod assist;From elevated surface;+2 physical assistance         General transfer comment: multimodal cues for safe technique and maintaining precautions, assist to rise and steady, pt only able to shift feet along floor a couple times toward HOB, unable to lift feet or move well enough for pivot to recliner  Ambulation/Gait                Stairs            Wheelchair Mobility    Modified Rankin (Stroke Patients Only)       Balance Overall balance assessment: Needs assistance         Standing balance support: Bilateral upper extremity supported Standing balance-Leahy Scale: Zero                               Pertinent Vitals/Pain Pain Assessment: Faces Faces Pain Scale: Hurts little more Pain Location: right hip Pain Descriptors / Indicators: Grimacing;Guarding Pain Intervention(s): Repositioned;Monitored during session    Home Living Family/patient expects to be discharged to:: Skilled nursing facility Living Arrangements: Spouse/significant other                    Prior Function Level of Independence: Independent with assistive device(s);Needs assistance   Gait / Transfers Assistance Needed: uses standard walker in house, rollator for community           Hand Dominance        Extremity/Trunk Assessment        Lower Extremity Assessment Lower Extremity Assessment: Generalized weakness;RLE deficits/detail RLE Deficits / Details: requiring assist due to pain RLE Sensation: history  of peripheral neuropathy       Communication   Communication: HOH  Cognition Arousal/Alertness: Awake/alert Behavior During Therapy: Flat affect Overall Cognitive Status: Impaired/Different from baseline Area of Impairment: Following commands;Problem solving                       Following Commands: Follows one step commands inconsistently;Follows one step commands with increased time     Problem Solving: Slow  processing;Requires verbal cues;Requires tactile cues;Difficulty sequencing General Comments: spouse reports cognition not at baseline, hx of dementia      General Comments      Exercises     Assessment/Plan    PT Assessment Patient needs continued PT services  PT Problem List Decreased balance;Decreased range of motion;Decreased strength;Decreased mobility;Decreased cognition;Decreased knowledge of use of DME;Decreased knowledge of precautions;Decreased coordination;Decreased activity tolerance       PT Treatment Interventions DME instruction;Gait training;Balance training;Therapeutic exercise;Functional mobility training;Therapeutic activities;Patient/family education;Neuromuscular re-education;Cognitive remediation    PT Goals (Current goals can be found in the Care Plan section)  Acute Rehab PT Goals PT Goal Formulation: With patient/family Time For Goal Achievement: 06/07/20 Potential to Achieve Goals: Good    Frequency Min 5X/week   Barriers to discharge        Co-evaluation               AM-PAC PT "6 Clicks" Mobility  Outcome Measure Help needed turning from your back to your side while in a flat bed without using bedrails?: Total Help needed moving from lying on your back to sitting on the side of a flat bed without using bedrails?: Total Help needed moving to and from a bed to a chair (including a wheelchair)?: Total Help needed standing up from a chair using your arms (e.g., wheelchair or bedside chair)?: Total Help needed to walk in hospital room?: Total Help needed climbing 3-5 steps with a railing? : Total 6 Click Score: 6    End of Session Equipment Utilized During Treatment: Gait belt Activity Tolerance: Patient tolerated treatment well;Patient limited by fatigue Patient left: in bed;with call bell/phone within reach;with bed alarm set;with family/visitor present Nurse Communication: Mobility status PT Visit Diagnosis: Other abnormalities of gait  and mobility (R26.89)    Time: 0737-1062 PT Time Calculation (min) (ACUTE ONLY): 24 min   Charges:   PT Evaluation $PT Eval Moderate Complexity: 1 Mod        Kati PT, DPT Acute Rehabilitation Services Pager: [phone] Office: [phone]  York Ram E 05/24/2020, 11:42 AM

## 2020-05-24 NOTE — Progress Notes (Signed)
PROGRESS NOTE    Christian Miller  OIZ:[id number] DOB: [birth year] DOA: 05/21/2020 PCP: [name], MD    Brief Narrative:Christian Amada Jupiter. is a 85 y.o.malewith a past medical history of dementia, overactive bladder, essential hypertension, behavioral disturbances secondary to dementia, hypothyroidism who was in his usual state of health till yesterday when he was in the kitchen and he lost his balance and he fell.  Evaluation at [facility] revealed right hip fracture. There was no orthopedic coverage at that facility and thus, the patient was transferred to [facility].  Assessment & Plan:   Principal Problem:   Closed right hip fracture [facility]) Active Problems:   HTN (hypertension)   OAB (overactive bladder)   Dementia without behavioral disturbance ([place])   #1 Right Hip fracture status post right total hip arthroplasty  05/23/2020. PT eval noted recommending CIR consult placed. Patient lives at home with his wife.  #2 Essential HTN on amlodipine.  Blood pressures running soft DC Norvasc.  Start normal saline 75 cc an hour.  Blood pressure today 107/50.  #3Hypothyroidsm continue Synthroid  #4 neuropathy on gabapentin  #5 dementia without behavioral disturbance continue Zyprexa Depakote Namenda and Zoloft.  #6 overactive bladder continue alfuzosin.  #7 hyponatremia sodium 132 status post normal saline yesterday.  #8 leukocytosis 15.3 up from 11.7 yesterday.  No evidence of any infection noted at this time.  Likely reactive but will follow.   Nutrition Problem: Increased nutrient needs Etiology: post-op healing     Signs/Symptoms: estimated needs    Interventions: MVI, Other (Comment) (ProSource Plus; Dillard Essex)  Estimated body mass index is 27.24 kg/m as calculated from the following:   Height as of this encounter: 6' (1.829 m).   Weight as of this encounter: 91.1 kg.  DVT prophylaxis: Lovenox  code Status: Full code Family Communication:  None at bedside Disposition Plan:  Status is: Inpatient status post right hip arthroplasty pending CIR evaluation   Dispo: The patient is from: Home              Anticipated d/c is to: SNF versus CIR              Anticipated d/c date is: Unknown              Patient currently is not medically stable to d/c.    Consultants: Ortho Procedures: Right hip arthroplasty 05/23/2020 Antimicrobials: None  Subjective:  Resting in bed overnight staff reported no events Complaining of right hip pain Objective: Vitals:   05/23/20 2025 05/23/20 2250 05/24/20 0112 05/24/20 0512  BP: (!) 146/76 120/60 118/71 (!) 107/50  Pulse: 64 68 79 75  Resp: 18 18 17 17   Temp: 97.7 F (36.5 C) 97.9 F (36.6 C) 97.7 F (36.5 C) 98.3 F (36.8 C)  TempSrc: Oral Oral Oral Oral  SpO2: 97% 97% 97% 96%  Weight:      Height:        Intake/Output Summary (Last 24 hours) at 05/24/2020 1202 Last data filed at 05/24/2020 0618 Gross per 24 hour  Intake 3768.75 ml  Output 2425 ml  Net 1343.75 ml   Filed Weights   05/22/20 1100  Weight: 91.1 kg    Examination:  General exam: Appears calm and comfortable oral mucosa dry Respiratory system: Clear to auscultation. Respiratory effort normal. Cardiovascular system: S1 & S2 heard, RRR. No JVD, murmurs, rubs, gallops or clicks. No pedal edema. Gastrointestinal system: Abdomen is nondistended, soft and nontender. No organomegaly or  masses felt. Normal bowel sounds heard. Central nervous system: Alert and oriented. No focal neurological deficits. Extremities: Right hip incision covered with dressing skin: No rashes, lesions or ulcers Psychiatry: Judgement and insight appear normal. Mood & affect appropriate.     Data Reviewed: I have personally reviewed following labs and imaging studies  CBC: Recent Labs  Lab 05/22/20 0245 05/23/20 0311 05/24/20 0226  WBC 13.6* 11.7* 15.3*  HGB 11.7* 11.4* 10.0*  HCT 35.3* 34.8* 31.5*  MCV 89.6 89.9 93.2  PLT 229 202  093   Basic Metabolic Panel: Recent Labs  Lab 05/22/20 0245 05/22/20 1405 05/23/20 0311 05/24/20 0226  NA 124* 134* 131* 132*  K 4.0 4.5 4.1 4.6  CL 93* 98 98 99  CO2 23 27 26 23   GLUCOSE 112* 135* 120* 127*  BUN 16 14 14 14   CREATININE 1.01 1.20 0.97 1.15  CALCIUM 8.4* 8.9 8.6* 8.2*   GFR: Estimated Creatinine Clearance: 54.4 mL/min (by C-G formula based on SCr of 1.15 mg/dL). Liver Function Tests: No results for input(s): AST, ALT, ALKPHOS, BILITOT, PROT, ALBUMIN in the last 168 hours. No results for input(s): LIPASE, AMYLASE in the last 168 hours. No results for input(s): AMMONIA in the last 168 hours. Coagulation Profile: No results for input(s): INR, PROTIME in the last 168 hours. Cardiac Enzymes: No results for input(s): CKTOTAL, CKMB, CKMBINDEX, TROPONINI in the last 168 hours. BNP (last 3 results) No results for input(s): PROBNP in the last 8760 hours. HbA1C: No results for input(s): HGBA1C in the last 72 hours. CBG: No results for input(s): GLUCAP in the last 168 hours. Lipid Profile: No results for input(s): CHOL, HDL, LDLCALC, TRIG, CHOLHDL, LDLDIRECT in the last 72 hours. Thyroid Function Tests: No results for input(s): TSH, T4TOTAL, FREET4, T3FREE, THYROIDAB in the last 72 hours. Anemia Panel: No results for input(s): VITAMINB12, FOLATE, FERRITIN, TIBC, IRON, RETICCTPCT in the last 72 hours. Sepsis Labs: No results for input(s): PROCALCITON, LATICACIDVEN in the last 168 hours.  Recent Results (from the past 240 hour(s))  SARS Coronavirus 2 by RT PCR (hospital order, performed in [facility] hospital lab) Nasopharyngeal Nasal Mucosa     Status: None   Collection Time: 05/22/20  2:00 AM   Specimen: Nasal Mucosa; Nasopharyngeal  Result Value Ref Range Status   SARS Coronavirus 2 NEGATIVE NEGATIVE Final    Comment: (NOTE) SARS-CoV-2 target nucleic acids are NOT DETECTED.  The SARS-CoV-2 RNA is generally detectable in upper and lower respiratory specimens  during the acute phase of infection. The lowest concentration of SARS-CoV-2 viral copies this assay can detect is 250 copies / mL. A negative result does not preclude SARS-CoV-2 infection and should not be used as the sole basis for treatment or other patient management decisions.  A negative result may occur with improper specimen collection / handling, submission of specimen other than nasopharyngeal swab, presence of viral mutation(s) within the areas targeted by this assay, and inadequate number of viral copies (<250 copies / mL). A negative result must be combined with clinical observations, patient history, and epidemiological information.  Fact Sheet for Patients:   StrictlyIdeas.no  Fact Sheet for Healthcare Providers: BankingDealers.co.za  This test is not yet approved or  cleared by the [place] FDA and has been authorized for detection and/or diagnosis of SARS-CoV-2 by FDA under an Emergency Use Authorization (EUA).  This EUA will remain in effect (meaning this test can be used) for the duration of the COVID-19 declaration under Section 564(b)(1) of the  Act, 21 U.S.C. section 360bbb-3(b)(1), unless the authorization is terminated or revoked sooner.  Performed at [facility], [place] [address] [place], [place] [address]   MRSA PCR Screening     Status: None   Collection Time: 05/23/20 12:31 AM   Specimen: Nasal Mucosa; Nasopharyngeal  Result Value Ref Range Status   MRSA by PCR NEGATIVE NEGATIVE Final    Comment:        The GeneXpert MRSA Assay (FDA approved for NASAL specimens only), is one component of a comprehensive MRSA colonization surveillance program. It is not intended to diagnose MRSA infection nor to guide or monitor treatment for MRSA infections. Performed at [facility], [place] [address], [place], [place] [address]          Radiology Studies: Pelvis  Portable  Result Date: 05/23/2020 CLINICAL DATA:  Status post ORIF right hip. EXAM: PORTABLE PELVIS 1-2 VIEWS COMPARISON:  Concurrent postoperative hip exam. FINDINGS: Right hip arthroplasty in expected alignment. No periprosthetic lucency. Distal aspect of the femoral stem is included on concurrent hip exam. Recent postsurgical change includes air and edema in the joint space and soft tissues. Left hip arthroplasty is intact were visualized. IMPRESSION: Post right hip arthroplasty without immediate postoperative complication. Electronically Signed   By: Keith Rake M.D.   On: 05/23/2020 17:34   DG Hip Port Unilat With Pelvis 1V Right  Result Date: 05/23/2020 CLINICAL DATA:  Postop. EXAM: DG HIP (WITH OR WITHOUT PELVIS) 1V PORT RIGHT COMPARISON:  Preoperative radiograph 05/21/2020 FINDINGS: Right hip arthroplasty in expected alignment. No periprosthetic lucency or fracture. Recent postsurgical change includes air and edema in the joint space and soft tissues. IMPRESSION: Right hip arthroplasty without immediate postoperative complication. Electronically Signed   By: Keith Rake M.D.   On: 05/23/2020 17:27        Scheduled Meds: . (feeding supplement) PROSource Plus  30 mL Oral Daily  . acetaminophen  500 mg Oral Q6H  . alfuzosin  10 mg Oral QHS  . amLODipine  2.5 mg Oral Daily  . divalproex  125 mg Oral QHS  . docusate sodium  100 mg Oral BID  . enoxaparin (LOVENOX) injection  40 mg Subcutaneous Q24H  . feeding supplement (KATE FARMS STANDARD 1.4)  325 mL Oral Daily  . ferrous sulfate  325 mg Oral TID PC  . folic acid  992 mcg Oral QHS  . gabapentin  600 mg Oral TID  . levothyroxine  75 mcg Oral QAC breakfast  . memantine  5 mg Oral BID  . multivitamin with minerals  1 tablet Oral Daily  . OLANZapine  15 mg Oral QHS  . pantoprazole  40 mg Oral Daily  . polyethylene glycol  17 g Oral Daily  . senna  1 tablet Oral BID  . sertraline  100 mg Oral BID   Continuous Infusions: .  0.45 % NaCl with KCl 20 mEq / L 75 mL/hr at 05/24/20 [place], MD  05/24/2020, 12:02 PM

## 2020-05-24 NOTE — Plan of Care (Signed)
Helping patient to maintain adequate nutrition by encouraging and helping patient with meals. Deanna Artis, RN

## 2020-05-24 NOTE — Progress Notes (Signed)
IP rehab admissions - I am following for potential acute inpatient rehab admission.  Will need an OT evaluation.  I will follow up tomorrow.  Call me for questions.  [phone]

## 2020-05-25 ENCOUNTER — Inpatient Hospital Stay (HOSPITAL_COMMUNITY): Payer: PPO

## 2020-05-25 LAB — URINALYSIS, ROUTINE W REFLEX MICROSCOPIC
Bilirubin Urine: NEGATIVE
Glucose, UA: NEGATIVE mg/dL
Hgb urine dipstick: NEGATIVE
Ketones, ur: NEGATIVE mg/dL
Leukocytes,Ua: NEGATIVE
Nitrite: NEGATIVE
Protein, ur: NEGATIVE mg/dL
Specific Gravity, Urine: 1.01 (ref 1.005–1.030)
pH: 6 (ref 5.0–8.0)

## 2020-05-25 LAB — CBC
HCT: 26.9 % — ABNORMAL LOW (ref 39.0–52.0)
Hemoglobin: 8.6 g/dL — ABNORMAL LOW (ref 13.0–17.0)
MCH: 29.5 pg (ref 26.0–34.0)
MCHC: 32 g/dL (ref 30.0–36.0)
MCV: 92.1 fL (ref 80.0–100.0)
Platelets: 204 10*3/uL (ref 150–400)
RBC: 2.92 MIL/uL — ABNORMAL LOW (ref 4.22–5.81)
RDW: 13.1 % (ref 11.5–15.5)
WBC: 17.3 10*3/uL — ABNORMAL HIGH (ref 4.0–10.5)
nRBC: 0 % (ref 0.0–0.2)

## 2020-05-25 LAB — COMPREHENSIVE METABOLIC PANEL
ALT: 12 U/L (ref 0–44)
AST: 26 U/L (ref 15–41)
Albumin: 2.7 g/dL — ABNORMAL LOW (ref 3.5–5.0)
Alkaline Phosphatase: 59 U/L (ref 38–126)
Anion gap: 7 (ref 5–15)
BUN: 23 mg/dL (ref 8–23)
CO2: 21 mmol/L — ABNORMAL LOW (ref 22–32)
Calcium: 7.8 mg/dL — ABNORMAL LOW (ref 8.9–10.3)
Chloride: 98 mmol/L (ref 98–111)
Creatinine, Ser: 1.28 mg/dL — ABNORMAL HIGH (ref 0.61–1.24)
GFR calc Af Amer: >60 mL/min (ref >60)
GFR calc non Af Amer: 52 mL/min — ABNORMAL LOW (ref >60)
Glucose, Bld: 162 mg/dL — ABNORMAL HIGH (ref 70–99)
Potassium: 4.5 mmol/L (ref 3.5–5.1)
Sodium: 126 mmol/L — ABNORMAL LOW (ref 135–145)
Total Bilirubin: 0.4 mg/dL (ref 0.3–1.2)
Total Protein: 5.5 g/dL — ABNORMAL LOW (ref 6.5–8.1)

## 2020-05-25 LAB — TSH: TSH: 2.207 u[IU]/mL (ref 0.350–4.500)

## 2020-05-25 MED ORDER — GABAPENTIN 100 MG PO CAPS: 200.0000 mg | ORAL_CAPSULE | Freq: Two times a day (BID) | ORAL | Status: DC

## 2020-05-25 MED ORDER — SODIUM CHLORIDE 0.9 % IV SOLN
1.0000 g | Freq: Once | INTRAVENOUS | Status: AC
  Administered 2020-05-25: 1 g via INTRAVENOUS
  Filled 2020-05-25: qty 10
  Filled 2020-05-25: qty 1

## 2020-05-25 MED ORDER — GABAPENTIN 300 MG PO CAPS
300.0000 mg | ORAL_CAPSULE | Freq: Three times a day (TID) | ORAL | Status: DC
  Administered 2020-05-25 – 2020-05-29 (×12): 300 mg via ORAL
  Filled 2020-05-25 (×12): qty 1

## 2020-05-25 MED ORDER — ACETAMINOPHEN 325 MG PO TABS
650.0000 mg | ORAL_TABLET | Freq: Three times a day (TID) | ORAL | Status: DC
  Administered 2020-05-25 – 2020-05-30 (×17): 650 mg via ORAL
  Filled 2020-05-25 (×17): qty 2

## 2020-05-25 NOTE — Progress Notes (Signed)
PROGRESS NOTE    Christian Miller  CBS:[id number] DOB: October 16, 1934 DOA: 05/21/2020 PCP: [name], MD    Brief Narrative:Christian Amada Jupiter. is a 85 y.o.malewith a past medical history of dementia, overactive bladder, essential hypertension, behavioral disturbances secondary to dementia, hypothyroidism who was in his usual state of health till yesterday when he was in the kitchen and he lost his balance and he fell.  Evaluation at [facility] revealed right hip fracture. There was no orthopedic coverage at that facility and thus, the patient was transferred to [facility].  Assessment & Plan:   Principal Problem:   Closed right hip fracture [facility]) Active Problems:   HTN (hypertension)   OAB (overactive bladder)   Dementia without behavioral disturbance ([place])   #1 Right Hip fracture status post right total hip arthroplasty  05/23/2020. PT eval noted recommending CIR. CIR requesting OT consult which is placed.   DC all narcotics Zyprexa start Tylenol around-the-clock. Patient lives at home with his wife. Wife concerned that he is very confused.    #2 Essential HTN on amlodipine.  Blood pressures running soft DC Norvasc.  Start normal saline 75 cc an hour.  Blood pressure today 111/61  #3Hypothyroidsm continue Synthroid  #4 neuropathy on gabapentin  #5 dementia without behavioral disturbance-dc zyprexa.  Continue Depakote Namenda and Zoloft.  Patient was not on Zyprexa at home.  #6 overactive bladder/bph -continue alfuzosin.  #7 hyponatremia sodium down to 126.  I will hold Zoloft to see if this improves.  Continue normal saline 75 cc an hour.   #8 leukocytosis -slowly trending up to 17.3 from 15.3 up from 11.7 yesterday.  No evidence of any infection noted at this time.  Check UA chest x-ray blood cultures.  #9 AKI creatinine 1.28 creatinine clearance 48 continue IV fluids.  Nutrition Problem: Increased nutrient needs Etiology: post-op  healing     Signs/Symptoms: estimated needs    Interventions: MVI, Other (Comment) (ProSource Plus; Dillard Essex)  Estimated body mass index is 27.24 kg/m as calculated from the following:   Height as of this encounter: 6' (1.829 m).   Weight as of this encounter: 91.1 kg.  DVT prophylaxis: Lovenox  code Status: Full code Family Communication discussed with patient's wife Disposition Plan:  Status is: Inpatient status post right hip arthroplasty pending CIR evaluation   Dispo: The patient is from: Home              Anticipated d/c is to: SNF versus CIR              Anticipated d/c date is: Unknown              Patient currently is not medically stable to d/c.    Consultants: Ortho Procedures: Right hip arthroplasty 05/23/2020 Antimicrobials: None  Subjective:  Resting in bed in no acute distress hard of hearing.  Asked me to shut the TV off.  He is awake oriented to hospital.  Denies any pain.  No nausea vomiting.  He had 2 bowel movements overnight. Objective: Vitals:   05/24/20 0512 05/24/20 1300 05/24/20 2111 05/25/20 0515  BP: (!) 107/50 (!) 102/49 112/71 111/61  Pulse: 75 69 77 70  Resp: 17 12 18 18   Temp: 98.3 F (36.8 C)  100 F (37.8 C) 99 F (37.2 C)  TempSrc: Oral  Oral Oral  SpO2: 96% (!) 87% (!) 80% 91%  Weight:      Height:  Intake/Output Summary (Last 24 hours) at 05/25/2020 1151 Last data filed at 05/25/2020 0911 Gross per 24 hour  Intake 1625 ml  Output 1126 ml  Net 499 ml   Filed Weights   05/22/20 1100  Weight: 91.1 kg    Examination:  General exam: Appears calm and comfortable oral mucosa dry Respiratory system: Clear to auscultation. Respiratory effort normal. Cardiovascular system: S1 & S2 heard, RRR. No JVD, murmurs, rubs, gallops or clicks. No pedal edema. Gastrointestinal system: Abdomen is nondistended, soft and nontender. No organomegaly or masses felt. Normal bowel sounds heard. Central nervous system: Alert and  oriented. No focal neurological deficits. Extremities: Right hip incision covered with dressing skin: No rashes, lesions or ulcers Psychiatry: Judgement and insight appear normal. Mood & affect appropriate.     Data Reviewed: I have personally reviewed following labs and imaging studies  CBC: Recent Labs  Lab 05/22/20 0245 05/23/20 0311 05/24/20 0226 05/25/20 0250  WBC 13.6* 11.7* 15.3* 17.3*  HGB 11.7* 11.4* 10.0* 8.6*  HCT 35.3* 34.8* 31.5* 26.9*  MCV 89.6 89.9 93.2 92.1  PLT 229 202 227 681   Basic Metabolic Panel: Recent Labs  Lab 05/22/20 0245 05/22/20 1405 05/23/20 0311 05/24/20 0226 05/25/20 0250  NA 124* 134* 131* 132* 126*  K 4.0 4.5 4.1 4.6 4.5  CL 93* 98 98 99 98  CO2 23 27 26 23  21*  GLUCOSE 112* 135* 120* 127* 162*  BUN 16 14 14 14 23   CREATININE 1.01 1.20 0.97 1.15 1.28*  CALCIUM 8.4* 8.9 8.6* 8.2* 7.8*   GFR: Estimated Creatinine Clearance: 48.8 mL/min (A) (by C-G formula based on SCr of 1.28 mg/dL (H)). Liver Function Tests: Recent Labs  Lab 05/25/20 0250  AST 26  ALT 12  ALKPHOS 59  BILITOT 0.4  PROT 5.5*  ALBUMIN 2.7*   No results for input(s): LIPASE, AMYLASE in the last 168 hours. No results for input(s): AMMONIA in the last 168 hours. Coagulation Profile: No results for input(s): INR, PROTIME in the last 168 hours. Cardiac Enzymes: No results for input(s): CKTOTAL, CKMB, CKMBINDEX, TROPONINI in the last 168 hours. BNP (last 3 results) No results for input(s): PROBNP in the last 8760 hours. HbA1C: No results for input(s): HGBA1C in the last 72 hours. CBG: No results for input(s): GLUCAP in the last 168 hours. Lipid Profile: No results for input(s): CHOL, HDL, LDLCALC, TRIG, CHOLHDL, LDLDIRECT in the last 72 hours. Thyroid Function Tests: Recent Labs    05/25/20 1035  TSH 2.207   Anemia Panel: No results for input(s): VITAMINB12, FOLATE, FERRITIN, TIBC, IRON, RETICCTPCT in the last 72 hours. Sepsis Labs: No results for  input(s): PROCALCITON, LATICACIDVEN in the last 168 hours.  Recent Results (from the past 240 hour(s))  SARS Coronavirus 2 by RT PCR (hospital order, performed in [facility] hospital lab) Nasopharyngeal Nasal Mucosa     Status: None   Collection Time: 05/22/20  2:00 AM   Specimen: Nasal Mucosa; Nasopharyngeal  Result Value Ref Range Status   SARS Coronavirus 2 NEGATIVE NEGATIVE Final    Comment: (NOTE) SARS-CoV-2 target nucleic acids are NOT DETECTED.  The SARS-CoV-2 RNA is generally detectable in upper and lower respiratory specimens during the acute phase of infection. The lowest concentration of SARS-CoV-2 viral copies this assay can detect is 250 copies / mL. A negative result does not preclude SARS-CoV-2 infection and should not be used as the sole basis for treatment or other patient management decisions.  A negative result may occur with  improper specimen collection / handling, submission of specimen other than nasopharyngeal swab, presence of viral mutation(s) within the areas targeted by this assay, and inadequate number of viral copies (<250 copies / mL). A negative result must be combined with clinical observations, patient history, and epidemiological information.  Fact Sheet for Patients:   StrictlyIdeas.no  Fact Sheet for Healthcare Providers: BankingDealers.co.za  This test is not yet approved or  cleared by the [place] FDA and has been authorized for detection and/or diagnosis of SARS-CoV-2 by FDA under an Emergency Use Authorization (EUA).  This EUA will remain in effect (meaning this test can be used) for the duration of the COVID-19 declaration under Section 564(b)(1) of the Act, 21 U.S.C. section 360bbb-3(b)(1), unless the authorization is terminated or revoked sooner.  Performed at [facility], [place] [address] [place], [place] [address]   MRSA PCR Screening     Status: None    Collection Time: 05/23/20 12:31 AM   Specimen: Nasal Mucosa; Nasopharyngeal  Result Value Ref Range Status   MRSA by PCR NEGATIVE NEGATIVE Final    Comment:        The GeneXpert MRSA Assay (FDA approved for NASAL specimens only), is one component of a comprehensive MRSA colonization surveillance program. It is not intended to diagnose MRSA infection nor to guide or monitor treatment for MRSA infections. Performed at [facility], [place] [address], [place], [place] [address]          Radiology Studies: Pelvis Portable  Result Date: 05/23/2020 CLINICAL DATA:  Status post ORIF right hip. EXAM: PORTABLE PELVIS 1-2 VIEWS COMPARISON:  Concurrent postoperative hip exam. FINDINGS: Right hip arthroplasty in expected alignment. No periprosthetic lucency. Distal aspect of the femoral stem is included on concurrent hip exam. Recent postsurgical change includes air and edema in the joint space and soft tissues. Left hip arthroplasty is intact were visualized. IMPRESSION: Post right hip arthroplasty without immediate postoperative complication. Electronically Signed   By: Keith Rake M.D.   On: 05/23/2020 17:34   DG Hip Port Unilat With Pelvis 1V Right  Result Date: 05/23/2020 CLINICAL DATA:  Postop. EXAM: DG HIP (WITH OR WITHOUT PELVIS) 1V PORT RIGHT COMPARISON:  Preoperative radiograph 05/21/2020 FINDINGS: Right hip arthroplasty in expected alignment. No periprosthetic lucency or fracture. Recent postsurgical change includes air and edema in the joint space and soft tissues. IMPRESSION: Right hip arthroplasty without immediate postoperative complication. Electronically Signed   By: Keith Rake M.D.   On: 05/23/2020 17:27        Scheduled Meds: . (feeding supplement) PROSource Plus  30 mL Oral Daily  . acetaminophen  650 mg Oral TID  . alfuzosin  10 mg Oral QHS  . divalproex  125 mg Oral QHS  . docusate sodium  100 mg Oral BID  . enoxaparin (LOVENOX) injection   40 mg Subcutaneous Q24H  . feeding supplement (KATE FARMS STANDARD 1.4)  325 mL Oral Daily  . ferrous sulfate  325 mg Oral TID PC  . folic acid  527 mcg Oral QHS  . gabapentin  200 mg Oral BID  . levothyroxine  75 mcg Oral QAC breakfast  . memantine  5 mg Oral BID  . multivitamin with minerals  1 tablet Oral Daily  . pantoprazole  40 mg Oral Daily  . polyethylene glycol  17 g Oral Daily  . senna  1 tablet Oral BID  . sertraline  100 mg Oral BID   Continuous Infusions: . sodium chloride  75 mL/hr at 05/24/20 2207      Georgette Shell, MD  05/25/2020, 11:51 AM

## 2020-05-25 NOTE — Evaluation (Signed)
Occupational Therapy Evaluation Patient Details Name: Christian SPRUNG Sr. MRN: [id number] DOB: November 25, 1934 Today's Date: 05/25/2020    History of Present Illness 85 y.o. male with a past medical history of dementia, overactive bladder, essential hypertension, behavioral disturbances secondary to dementia, hypothyroidism and admitted with displaced right femoral neck fracture and s/p R THA with posterior approach 05/23/20   Clinical Impression   Christian Miller is an 85 year old man admitted with above diagnosis who presents s/p R THA with posterior hip precautions, decreased ROM and strength of right lower extremity, complaints of pain, and decreased activity tolerance resulting in decreased ability to perform functional mobility and ADLs. Patient required max assist for rolling in bed, max assist for transfer to side of bed, max x 2 for standing. Patient min assist for upper body bathing and dressing and total assist for toileting and lower body dressing. Patient exhibited mild tremors in upper extremities with use of walker and difficulty motor planning with attempts at steps at side of bed. Patient has a history of dementia but alert and oriented during evaluation. Patient is hard to understand at times due to mumbling speech. Patient will benefit from skilled OT services to improve deficits, learn compensatory strategies and how to use DME in order to improve functional abilities and return to PLOF. Patient will benefit from aggressive therapy post acute in order to maximize functional skills and mobility. Patient' spouse interested in CIR. If unable to be admitted to CIR - recommend SNF at discharge.    Follow Up Recommendations  CIR    Equipment Recommendations  Other (comment) (TBD)    Recommendations for Other Services       Precautions / Restrictions Precautions Precautions: Posterior Hip;Fall Precaution Comments: reviewed posterior hip precautions Restrictions Weight Bearing  Restrictions: Yes RLE Weight Bearing: Weight bearing as tolerated      Mobility Bed Mobility Overal bed mobility: Needs Assistance Bed Mobility: Rolling;Supine to Sit;Sit to Supine Rolling: Max assist   Supine to sit: Max assist Sit to supine: Total assist;+2 for physical assistance;+2 for safety/equipment   General bed mobility comments: Max assist for rolling let and right. Patinet able to assit by pulling on bed rails. Max assist to transfer into sitting at side of bed - needing assistance for both LEs and trunk lift off. Total assist for return to supine. Patient was able to assist with being pulled up in bed by using trapeze bar.  Transfers Overall transfer level: Needs assistance Equipment used: Rolling walker (2 wheeled) Transfers: Sit to/from Stand Sit to Stand: Max assist;+2 physical assistance;From elevated surface         General transfer comment: Performed sit to stand x 3 with max x 2 with RW. Needed verbal and tactile cues for maintaining precautions, improving body position and using upper extremity on walker. Patien exhibited mild tremors through upper extremites on walker, unable to fully extend knees. Patient able to slide feet to the right towards the head of the bed but not take an actual step. Unable to get to recliner. Total assist to return to supine.    Balance Overall balance assessment: Needs assistance Sitting-balance support: No upper extremity supported;Feet supported Sitting balance-Leahy Scale: Fair Sitting balance - Comments: Initially used upper extremity to support himself and reduce weight bearing through hip but then able to take arms off of the bed to use urinal.   Standing balance support: Bilateral upper extremity supported;During functional activity Standing balance-Leahy Scale: Poor  ADL either performed or assessed with clinical judgement   ADL Overall ADL's : Needs  assistance/impaired Eating/Feeding: Set up;Bed level Eating/Feeding Details (indicate cue type and reason): Patient needs set up assist. Patient has ate all meals in bed. Grooming: Sitting;Set up;Wash/dry face   Upper Body Bathing: Sitting;Set up;Supervision/ safety;Min guard   Lower Body Bathing: Maximal assistance;Sit to/from stand;+2 for safety/equipment;+2 for physical assistance Lower Body Bathing Details (indicate cue type and reason): +2 for standing - to wash buttocks, periarea and back of legs. Upper Body Dressing : Set up;Sitting;Minimal assistance   Lower Body Dressing: Total assistance;+2 for physical assistance;+2 for safety/equipment;Sit to/from Health and safety inspector Details (indicate cue type and reason): unable to perform transfer at this time Toileting- Water quality scientist and Hygiene: Total assistance;+2 for physical assistance;+2 for safety/equipment;Sit to/from stand       Functional mobility during ADLs: +2 for physical assistance;+2 for safety/equipment;Maximal assistance       Vision   Vision Assessment?: No apparent visual deficits     Perception     Praxis      Pertinent Vitals/Pain Pain Assessment: Faces Faces Pain Scale: Hurts a little bit Pain Location: right hip Pain Descriptors / Indicators: Grimacing;Guarding Pain Intervention(s): Limited activity within patient's tolerance     Hand Dominance Right   Extremity/Trunk Assessment Upper Extremity Assessment Upper Extremity Assessment: RUE deficits/detail;LUE deficits/detail RUE Deficits / Details: 5/5 strength throughout LUE Deficits / Details: 5/5 strength throughout           Communication Communication Communication: HOH   Cognition Arousal/Alertness: Awake/alert Behavior During Therapy: WFL for tasks assessed/performed Overall Cognitive Status: Impaired/Different from baseline Area of Impairment: Following commands;Problem solving                       Following  Commands: Follows one step commands inconsistently;Follows one step commands with increased time     Problem Solving: Slow processing;Requires verbal cues;Requires tactile cues;Difficulty sequencing General Comments: Hx of dementia. patient's wife reports different from baseline but patient exhibited ability to listen to instructions and participate in conversation.   General Comments       Exercises     Shoulder Instructions      Home Living Family/patient expects to be discharged to:: Unsure Living Arrangements: Spouse/significant other                                      Prior Functioning/Environment Level of Independence: Independent with assistive device(s);Needs assistance  Gait / Transfers Assistance Needed: uses standard walker in house, rollator for community. Wife reports patient's gait had become ADL's / Homemaking Assistance Needed: Needed assistance to donn compression socks. Needed assistance for buttons/fasteners.            OT Problem List: Decreased strength;Decreased range of motion;Decreased activity tolerance;Impaired balance (sitting and/or standing);Decreased safety awareness;Decreased cognition;Decreased knowledge of use of DME or AE;Decreased knowledge of precautions;Pain      OT Treatment/Interventions: Self-care/ADL training;Therapeutic exercise;Neuromuscular education;DME and/or AE instruction;Therapeutic activities;Balance training;Patient/family education    OT Goals(Current goals can be found in the care plan section) Acute Rehab OT Goals Patient Stated Goal: To go to rehab OT Goal Formulation: With patient/family Time For Goal Achievement: 06/08/20 Potential to Achieve Goals: Good  OT Frequency: Min 2X/week   Barriers to D/C:            Co-evaluation  AM-PAC OT "6 Clicks" Daily Activity     Outcome Measure Help from another person eating meals?: None Help from another person taking care of personal  grooming?: A Little Help from another person toileting, which includes using toliet, bedpan, or urinal?: Total Help from another person bathing (including washing, rinsing, drying)?: A Lot Help from another person to put on and taking off regular upper body clothing?: A Little Help from another person to put on and taking off regular lower body clothing?: Total 6 Click Score: 14   End of Session Equipment Utilized During Treatment: Rolling walker;Gait belt;Oxygen Nurse Communication: Mobility status  Activity Tolerance: Patient tolerated treatment well Patient left: in bed;with call bell/phone within reach;with family/visitor present  OT Visit Diagnosis: Unsteadiness on feet (R26.81);Other abnormalities of gait and mobility (R26.89);Muscle weakness (generalized) (M62.81);Pain Pain - Right/Left: Right Pain - part of body: Hip                Time: 7195-9747 OT Time Calculation (min): 38 min Charges:  OT General Charges $OT Visit: 1 Visit OT Evaluation $OT Eval Moderate Complexity: 1 Mod OT Treatments $Self Care/Home Management : 8-22 mins  Kimisha Eunice, OTR/L [place]  Office [phone] Pager: [phone]   Lenward Chancellor 05/25/2020, 1:45 PM

## 2020-05-25 NOTE — Progress Notes (Signed)
Orthopedic Tech Progress Note Patient Details:  Christian EMANUELE Sr. 06/14/1935 [id number]  Ortho Devices Ortho Device/Splint Location: Trapeze bar Ortho Device/Splint Interventions: Application   Post Interventions Patient Tolerated: Well Instructions Provided: Care of device   [name] 05/25/2020, 10:36 AM

## 2020-05-25 NOTE — Progress Notes (Signed)
Subjective: 2 Days Post-Op s/p Procedure(s): TOTAL HIP ARTHROPLASTY  Patient states pain is a 6-7/10 this morning. Denies other complaints.  Objective:  PE: VITALS:   Vitals:   05/24/20 0512 05/24/20 1300 05/24/20 2111 05/25/20 0515  BP: (!) 107/50 (!) 102/49 112/71 111/61  Pulse: 75 69 77 70  Resp: 17 12 18 18   Temp: 98.3 F (36.8 C)  100 F (37.8 C) 99 F (37.2 C)  TempSrc: Oral  Oral Oral  SpO2: 96% (!) 87% (!) 80% 91%  Weight:      Height:       General: alert, oriented to person, place, and day. In no acute distress. ABD soft Neurovascular intact Sensation intact distally Intact pulses distally Dorsiflexion/Plantar flexion intact Incision: no drainage  LABS  Results for orders placed or performed during the hospital encounter of 05/21/20 (from the past 24 hour(s))  CBC     Status: Abnormal   Collection Time: 05/25/20  2:50 AM  Result Value Ref Range   WBC 17.3 (H) 4.0 - 10.5 K/uL   RBC 2.92 (L) 4.22 - 5.81 MIL/uL   Hemoglobin 8.6 (L) 13.0 - 17.0 g/dL   HCT 26.9 (L) 39 - 52 %   MCV 92.1 80.0 - 100.0 fL   MCH 29.5 26.0 - 34.0 pg   MCHC 32.0 30.0 - 36.0 g/dL   RDW 13.1 11.5 - 15.5 %   Platelets 204 150 - 400 K/uL   nRBC 0.0 0.0 - 0.2 %  Comprehensive metabolic panel     Status: Abnormal   Collection Time: 05/25/20  2:50 AM  Result Value Ref Range   Sodium 126 (L) 135 - 145 mmol/L   Potassium 4.5 3.5 - 5.1 mmol/L   Chloride 98 98 - 111 mmol/L   CO2 21 (L) 22 - 32 mmol/L   Glucose, Bld 162 (H) 70 - 99 mg/dL   BUN 23 8 - 23 mg/dL   Creatinine, Ser 1.28 (H) 0.61 - 1.24 mg/dL   Calcium 7.8 (L) 8.9 - 10.3 mg/dL   Total Protein 5.5 (L) 6.5 - 8.1 g/dL   Albumin 2.7 (L) 3.5 - 5.0 g/dL   AST 26 15 - 41 U/L   ALT 12 0 - 44 U/L   Alkaline Phosphatase 59 38 - 126 U/L   Total Bilirubin 0.4 0.3 - 1.2 mg/dL   GFR calc non Af Amer 52 (L) >60 mL/min   GFR calc Af Amer >60 >60 mL/min   Anion gap 7 5 - 15  TSH     Status: None   Collection Time: 05/25/20  10:35 AM  Result Value Ref Range   TSH 2.207 0.350 - 4.500 uIU/mL    Pelvis Portable  Result Date: 05/23/2020 CLINICAL DATA:  Status post ORIF right hip. EXAM: PORTABLE PELVIS 1-2 VIEWS COMPARISON:  Concurrent postoperative hip exam. FINDINGS: Right hip arthroplasty in expected alignment. No periprosthetic lucency. Distal aspect of the femoral stem is included on concurrent hip exam. Recent postsurgical change includes air and edema in the joint space and soft tissues. Left hip arthroplasty is intact were visualized. IMPRESSION: Post right hip arthroplasty without immediate postoperative complication. Electronically Signed   By: Keith Rake M.D.   On: 05/23/2020 17:34   DG Hip Port Unilat With Pelvis 1V Right  Result Date: 05/23/2020 CLINICAL DATA:  Postop. EXAM: DG HIP (WITH OR WITHOUT PELVIS) 1V PORT RIGHT COMPARISON:  Preoperative radiograph 05/21/2020 FINDINGS: Right hip arthroplasty in expected alignment. No periprosthetic lucency or  fracture. Recent postsurgical change includes air and edema in the joint space and soft tissues. IMPRESSION: Right hip arthroplasty without immediate postoperative complication. Electronically Signed   By: Keith Rake M.D.   On: 05/23/2020 17:27    Assessment/Plan: Closed right hip fracture 2 Days Post-Op s/p Procedure(s):TOTAL HIP ARTHROPLASTY  Weightbearing: WBAT RLE - up with therapy, posterior hip precautions Insicional and dressing care: Reinforce dressings as needed VTE prophylaxis: lovenox x 30 days Pain control: tylenol, limit opioids as much as possible Follow - up plan: 2 weeks with Dr. Mardelle Matte Dispo: PT recommending CIR, consult has been placed, OT eval order has been placed  Contact information:   Weekdays 8-5 Merlene Pulling, PA-C [phone] A fter hours and holidays please check [email or web address] for group call information for [facility] 05/25/2020, 12:03 PM

## 2020-05-25 NOTE — Progress Notes (Signed)
[facility] IP rehab admissions:  I spoke with patient's wife by phone.  She is in agreement to pursuit of inpatient rehab admission.  I have called and faxed information to [employer] carrier.  I will update all when I hear back from insurance case manager.  Call me for questions.  [phone]

## 2020-05-25 NOTE — Progress Notes (Signed)
Physical Therapy Treatment Patient Details Name: Christian MASTERSON Sr. MRN: [id number] DOB: 02/23/1935 Today's Date: 05/25/2020    History of Present Illness 85 y.o. male with a past medical history of dementia, overactive bladder, essential hypertension, behavioral disturbances secondary to dementia, hypothyroidism and admitted with displaced right femoral neck fracture and s/p R THA with posterior approach 05/23/20    PT Comments    Pt assisted with performed heel slides, hip abduction, ankle pumps, and quad sets bilaterally x10 in supine prior to mobility.  Pt had loose BM in bed so assisted pt with cues for rolling to perform pericare and change linen.  Pt then assisted sitting EOB.  Pt requiring multimodal cues and increased time.  Pt slow to process and has stiff movement.  Pt required tactile cues for Lt LE in standing to perform weight bearing on nonsurgical leg.  Pt assisted with performing sit to stands x3.  Continue to recommend CIR upon d/c.   Follow Up Recommendations  CIR;Supervision/Assistance - 24 hour     Equipment Recommendations  Other (comment) (per next venue)    Recommendations for Other Services       Precautions / Restrictions Precautions Precautions: Posterior Hip;Fall Precaution Comments: reviewed posterior hip precautions Restrictions RLE Weight Bearing: Weight bearing as tolerated    Mobility  Bed Mobility Overal bed mobility: Needs Assistance Bed Mobility: Rolling;Supine to Sit;Sit to Supine Rolling: Max assist;+2 for physical assistance   Supine to sit: Max assist;+2 for physical assistance Sit to supine: Total assist;+2 for physical assistance   General bed mobility comments: multimodal cues for technique, increased time and effort, pt able to assist a little today however still requires at least max +2 assist  Transfers Overall transfer level: Needs assistance Equipment used: Rolling walker (2 wheeled) Transfers: Sit to/from Stand Sit to Stand:  Max assist;+2 physical assistance;From elevated surface         General transfer comment: multimodal cues for safe technique and maintaining precautions, assist to rise and steady, pt mostly with weight shifted posteriorly and difficulty finding COG and maintaining, required tactile cues for Lt knee extension due to shaky/buckling however improved with second stand; performed x3 for technique and strengthening  Ambulation/Gait                 Stairs             Wheelchair Mobility    Modified Rankin (Stroke Patients Only)       Balance Overall balance assessment: Needs assistance Sitting-balance support: Bilateral upper extremity supported;Feet supported Sitting balance-Leahy Scale: Poor     Standing balance support: Bilateral upper extremity supported Standing balance-Leahy Scale: Zero                              Cognition Arousal/Alertness: Awake/alert Behavior During Therapy: Flat affect Overall Cognitive Status: Impaired/Different from baseline Area of Impairment: Following commands;Problem solving                       Following Commands: Follows one step commands inconsistently;Follows one step commands with increased time     Problem Solving: Slow processing;Requires verbal cues;Requires tactile cues;Difficulty sequencing General Comments: hx of dementia, slow processing, requires multimodal cues      Exercises      General Comments        Pertinent Vitals/Pain Pain Assessment: Faces Faces Pain Scale: Hurts even more Pain Location: right hip Pain Descriptors /  Indicators: Grimacing;Guarding Pain Intervention(s): Repositioned;Monitored during session    Home Living                      Prior Function            PT Goals (current goals can now be found in the care plan section) Progress towards PT goals: Progressing toward goals    Frequency    Min 5X/week      PT Plan Current plan remains  appropriate    Co-evaluation              AM-PAC PT "6 Clicks" Mobility   Outcome Measure  Help needed turning from your back to your side while in a flat bed without using bedrails?: Total Help needed moving from lying on your back to sitting on the side of a flat bed without using bedrails?: Total Help needed moving to and from a bed to a chair (including a wheelchair)?: Total Help needed standing up from a chair using your arms (e.g., wheelchair or bedside chair)?: Total Help needed to walk in hospital room?: Total Help needed climbing 3-5 steps with a railing? : Total 6 Click Score: 6    End of Session Equipment Utilized During Treatment: Gait belt Activity Tolerance: Patient tolerated treatment well;Patient limited by fatigue Patient left: in bed;with call bell/phone within reach;with bed alarm set;with family/visitor present   PT Visit Diagnosis: Other abnormalities of gait and mobility (R26.89)     Time: 1610-9604 PT Time Calculation (min) (ACUTE ONLY): 42 min  Charges:  $Therapeutic Activity: 38-52 mins                     Jannette Spanner PT, DPT Acute Rehabilitation Services Pager: [phone] Office: [phone]   Trena Platt 05/25/2020, 12:37 PM

## 2020-05-25 NOTE — Progress Notes (Signed)
Bladder scan 396 mL Pt unable to urinate. Paged floor coverage to obtain  order for I&O catheter.

## 2020-05-25 NOTE — Plan of Care (Signed)
Continuing to help patient manage adequate nutrition by encouraging foods and fluids. Deanna Artis RN

## 2020-05-26 LAB — COMPREHENSIVE METABOLIC PANEL
ALT: 11 U/L (ref 0–44)
AST: 22 U/L (ref 15–41)
Albumin: 2.9 g/dL — ABNORMAL LOW (ref 3.5–5.0)
Alkaline Phosphatase: 60 U/L (ref 38–126)
Anion gap: 8 (ref 5–15)
BUN: 19 mg/dL (ref 8–23)
CO2: 24 mmol/L (ref 22–32)
Calcium: 8.5 mg/dL — ABNORMAL LOW (ref 8.9–10.3)
Chloride: 101 mmol/L (ref 98–111)
Creatinine, Ser: 0.88 mg/dL (ref 0.61–1.24)
GFR calc Af Amer: >60 mL/min (ref >60)
GFR calc non Af Amer: >60 mL/min (ref >60)
Glucose, Bld: 138 mg/dL — ABNORMAL HIGH (ref 70–99)
Potassium: 4.2 mmol/L (ref 3.5–5.1)
Sodium: 133 mmol/L — ABNORMAL LOW (ref 135–145)
Total Bilirubin: 0.6 mg/dL (ref 0.3–1.2)
Total Protein: 5.8 g/dL — ABNORMAL LOW (ref 6.5–8.1)

## 2020-05-26 LAB — CBC
HCT: 27.7 % — ABNORMAL LOW (ref 39.0–52.0)
Hemoglobin: 8.9 g/dL — ABNORMAL LOW (ref 13.0–17.0)
MCH: 29.6 pg (ref 26.0–34.0)
MCHC: 32.1 g/dL (ref 30.0–36.0)
MCV: 92 fL (ref 80.0–100.0)
Platelets: 216 10*3/uL (ref 150–400)
RBC: 3.01 MIL/uL — ABNORMAL LOW (ref 4.22–5.81)
RDW: 13.2 % (ref 11.5–15.5)
WBC: 17.3 10*3/uL — ABNORMAL HIGH (ref 4.0–10.5)
nRBC: 0 % (ref 0.0–0.2)

## 2020-05-26 MED ORDER — DONEPEZIL HCL 10 MG PO TABS
5.0000 mg | ORAL_TABLET | Freq: Every day | ORAL | Status: DC
  Administered 2020-05-27 – 2020-05-30 (×4): 5 mg via ORAL
  Filled 2020-05-26 (×4): qty 1

## 2020-05-26 MED ORDER — DONEPEZIL HCL 10 MG PO TABS: 5.0000 mg | ORAL_TABLET | Freq: Two times a day (BID) | ORAL | Status: DC

## 2020-05-26 NOTE — Progress Notes (Signed)
Subjective: 3 Days Post-Op s/p Procedure(s):TOTAL HIP ARTHROPLASTY  Patient complains of no hip pain this morning, but condom catheter is painful. No other complaints.  Objective:  PE: VITALS:   Vitals:   05/25/20 0515 05/25/20 1410 05/25/20 2041 05/26/20 0618  BP: 111/61 (!) 134/57 (!) 178/66 124/70  Pulse: 70 64 64 70  Resp: 18 18 18 16   Temp: 99 F (37.2 C) 97.8 F (36.6 C) 99.5 F (37.5 C) 98.4 F (36.9 C)  TempSrc: Oral Oral    SpO2: 91% 100% 99% 99%  Weight:      Height:        ABD soft Neurovascular intact Sensation intact distally Intact pulses distally Dorsiflexion/Plantar flexion intact Incision: no drainage  LABS  Results for orders placed or performed during the hospital encounter of 05/21/20 (from the past 24 hour(s))  CBC     Status: Abnormal   Collection Time: 05/26/20  3:06 AM  Result Value Ref Range   WBC 17.3 (H) 4.0 - 10.5 K/uL   RBC 3.01 (L) 4.22 - 5.81 MIL/uL   Hemoglobin 8.9 (L) 13.0 - 17.0 g/dL   HCT 27.7 (L) 39 - 52 %   MCV 92.0 80.0 - 100.0 fL   MCH 29.6 26.0 - 34.0 pg   MCHC 32.1 30.0 - 36.0 g/dL   RDW 13.2 11.5 - 15.5 %   Platelets 216 150 - 400 K/uL   nRBC 0.0 0.0 - 0.2 %  Comprehensive metabolic panel     Status: Abnormal   Collection Time: 05/26/20  3:06 AM  Result Value Ref Range   Sodium 133 (L) 135 - 145 mmol/L   Potassium 4.2 3.5 - 5.1 mmol/L   Chloride 101 98 - 111 mmol/L   CO2 24 22 - 32 mmol/L   Glucose, Bld 138 (H) 70 - 99 mg/dL   BUN 19 8 - 23 mg/dL   Creatinine, Ser 0.88 0.61 - 1.24 mg/dL   Calcium 8.5 (L) 8.9 - 10.3 mg/dL   Total Protein 5.8 (L) 6.5 - 8.1 g/dL   Albumin 2.9 (L) 3.5 - 5.0 g/dL   AST 22 15 - 41 U/L   ALT 11 0 - 44 U/L   Alkaline Phosphatase 60 38 - 126 U/L   Total Bilirubin 0.6 0.3 - 1.2 mg/dL   GFR calc non Af Amer >60 >60 mL/min   GFR calc Af Amer >60 >60 mL/min   Anion gap 8 5 - 15    DG Chest 1 View  Result Date: 05/25/2020 CLINICAL DATA:  Two days postop following right hip  replacement EXAM: CHEST  1 VIEW COMPARISON:  05/21/2020 FINDINGS: Mild left basilar atelectasis is again noted and stable. No new focal infiltrate is noted. Small left effusion is seen as well. Aortic calcifications are noted. Cardiac shadow is stable. IMPRESSION: Mild left basilar atelectasis and effusions stable from the prior exam. Electronically Signed   By: Inez Catalina M.D.   On: 05/25/2020 12:29    Assessment/Plan: Principal Problem:   Closed right hip fracture (HCC) Active Problems:   HTN (hypertension)   OAB (overactive bladder)   Dementia without behavioral disturbance ([place])  3 Days Post-Op s/p Procedure(s): TOTAL HIP ARTHROPLASTY  Weightbearing:WBATRLE- up with therapy,  Insicional and dressing care:Reinforce dressings as needed VTE prophylaxis:lovenox x 30 days Pain control: tylenol Follow - up plan:2 weekswith Dr. Mardelle Matte after discharge Dispo: pending insurance approval for CIR   Contact information:   Weekdays 8-5 Merlene Pulling, [place] [phone] A  fter hours and holidays please check [email or web address] for group call information for [facility]  Ventura Bruns 05/26/2020, 11:41 AM

## 2020-05-26 NOTE — Progress Notes (Signed)
Inpatient Rehab Admissions Coordinator:   Notified by insurance that request for prior auth for CIR has been denied.  Will need to seek rehab in a lower level of care.  I left a voicemail for pt's wife, and let Lennart Pall, CSW, and Dr. Rodena Piety know. Will sign off at this time.   Shann Medal, PT, DPT Admissions Coordinator [phone] 05/26/20  2:24 PM

## 2020-05-26 NOTE — Progress Notes (Addendum)
Physical Therapy Treatment Patient Details Name: Christian Miller. MRN: [id number] DOB: [birth year] Today's Date: 05/26/2020    History of Present Illness 85 y.o. male with a past medical history of dementia, overactive bladder, essential hypertension, behavioral disturbances secondary to dementia, hypothyroidism and admitted with displaced right femoral neck fracture and s/p R THA with posterior approach 05/23/20    PT Comments    Pt assisted with performing sit to stands and able to transfer to recliner today!  Pt continues to require significant assist and +2 however better able to follow commands and initiate cues today. Continue to recommend CIR upon d/c.   Follow Up Recommendations  CIR;Supervision/Assistance - 24 hour     Equipment Recommendations  Other (comment) (per next venue)    Recommendations for Other Services       Precautions / Restrictions Precautions Precautions: Posterior Hip;Fall Precaution Comments: reviewed posterior hip precautions Restrictions RLE Weight Bearing: Weight bearing as tolerated    Mobility  Bed Mobility Overal bed mobility: Needs Assistance Bed Mobility: Supine to Sit     Supine to sit: Max assist     General bed mobility comments: pt initiated bed mobility with verbal cues today, requiring assist to bring LEs over EOB, and scoot to EOB, pt able to better assist with trunk today  Transfers Overall transfer level: Needs assistance Equipment used: Rolling walker (2 wheeled) Transfers: Sit to/from Stand Sit to Stand: Mod assist;Max assist;+2 physical assistance         General transfer comment: pt's feet blocked, pt provided with cues for hand placement and weight shifting, tends to keep weight posteriorly in heels however able to correct with cues, performed sit to stand from bed, then again to transfer to recliner, and again from recliner.  Pt with improved sit to stand from recliner likely due to use of armrests; also performed a  few mini weight shifting from recliner with hands on armrests and lifting buttocks from recliner then return to sitting x10  Ambulation/Gait                 Stairs             Wheelchair Mobility    Modified Rankin (Stroke Patients Only)       Balance                                            Cognition Arousal/Alertness: Awake/alert Behavior During Therapy: WFL for tasks assessed/performed Overall Cognitive Status: History of cognitive impairments - at baseline                                 General Comments: hx of dementia, pt appears a little confused however much more clear with ability to follow instructions and participate today      Exercises      General Comments        Pertinent Vitals/Pain Pain Assessment: Faces Faces Pain Scale: Hurts a little bit Pain Location: right hip Pain Descriptors / Indicators: Grimacing;Guarding Pain Intervention(s): Monitored during session;Repositioned    Home Living                      Prior Function            PT Goals (current goals can now be found in  the care plan section) Progress towards PT goals: Progressing toward goals    Frequency    Min 5X/week      PT Plan Current plan remains appropriate    Co-evaluation PT/OT/SLP Co-Evaluation/Treatment: Yes Reason for Co-Treatment: For patient/therapist safety;To address functional/ADL transfers PT goals addressed during session: Mobility/safety with mobility OT goals addressed during session: ADL's and self-care      AM-PAC PT "6 Clicks" Mobility   Outcome Measure  Help needed turning from your back to your side while in a flat bed without using bedrails?: A Lot Help needed moving from lying on your back to sitting on the side of a flat bed without using bedrails?: A Lot Help needed moving to and from a bed to a chair (including a wheelchair)?: A Lot Help needed standing up from a chair using your  arms (e.g., wheelchair or bedside chair)?: A Lot Help needed to walk in hospital room?: Total Help needed climbing 3-5 steps with a railing? : Total 6 Click Score: 10    End of Session Equipment Utilized During Treatment: Gait belt Activity Tolerance: Patient tolerated treatment well;Patient limited by fatigue Patient left: in chair;with call bell/phone within reach;with chair alarm set Nurse Communication: Mobility status PT Visit Diagnosis: Other abnormalities of gait and mobility (R26.89)     Time: 1116-1140 PT Time Calculation (min) (ACUTE ONLY): 24 min  Charges:  $Therapeutic Activity: 8-22 mins                    Jannette Spanner PT, DPT Acute Rehabilitation Services Pager: [phone] Office: [phone]  York Ram E 05/26/2020, 1:11 PM

## 2020-05-26 NOTE — Progress Notes (Signed)
PROGRESS NOTE    Christian Miller  UKG:[id number] DOB: 1934-12-14 DOA: 05/21/2020 PCP: [name], MD    Brief Narrative:Christian Amada Jupiter. is a 85 y.o.malewith a past medical history of dementia, overactive bladder, essential hypertension, behavioral disturbances secondary to dementia, hypothyroidism who was in his usual state of health till yesterday when he was in the kitchen and he lost his balance and he fell.  Evaluation at [facility] revealed right hip fracture. There was no orthopedic coverage at that facility and thus, the patient was transferred to [facility].  Assessment & Plan:   Principal Problem:   Closed right hip fracture [facility]) Active Problems:   HTN (hypertension)   OAB (overactive bladder)   Dementia without behavioral disturbance ([place])   #1 Right Hip fracture status post right total hip arthroplasty  05/23/2020. insurance refused CIR.  Now looking for SNF. Per wife he still remains confused he is talking things out of his head he was not like that prior to admission.  In the past when he had surgery patient had a confusion which lasted for a week or 2 which got improved on its own.  At this time there is no evidence of active infection noted anywhere.  His white count still remains elevated at 17.5.  I have stopped all the narcotics Zyprexa Wife concerned that he is very confused.    #2 Essential HTN on amlodipine.  Blood pressure improved 124/70 continue to hold Norvasc.   #3Hypothyroidsm continue Synthroid  #4 neuropathy on gabapentin  #5 dementia without behavioral disturbance-dc zyprexa.  Continue Depakote Namenda and Zoloft.  Patient was not on Zyprexa at home.  #6 overactive bladder/bph -continue alfuzosin.  #7 hyponatremia sodium improved to 132.    #8 leukocytosis -still remains elevated with no evidence of infection noted.  Follow-up in a.m.    #9 AKI creatinine 0.88.    Nutrition Problem: Increased nutrient needs Etiology:  post-op healing     Signs/Symptoms: estimated needs    Interventions: MVI, Other (Comment) (ProSource Plus; Dillard [place])  Estimated body mass index is 27.24 kg/m as calculated from the following:   Height as of this encounter: 6' (1.829 m).   Weight as of this encounter: 91.1 kg.  DVT prophylaxis: Lovenox  code Status: Full code Family Communication discussed with patient's wife Disposition Plan:  Status is: Inpatient status post right hip arthroplasty pending CIR evaluation   Dispo: The patient is from: Home              Anticipated d/c is to: SNF               Anticipated d/c date is: Unknown              Patient currently is medically stable to be discharged.    Consultants: Ortho Procedures: Right hip arthroplasty 05/23/2020 Antimicrobials: None  Subjective:  Resting in bed in no acute distress  He is awake oriented to hospital answer all my questions appropriately.   Objective: Vitals:   05/25/20 0515 05/25/20 1410 05/25/20 2041 05/26/20 0618  BP: 111/61 (!) 134/57 (!) 178/66 124/70  Pulse: 70 64 64 70  Resp: 18 18 18 16   Temp: 99 F (37.2 C) 97.8 F (36.6 C) 99.5 F (37.5 C) 98.4 F (36.9 C)  TempSrc: Oral Oral    SpO2: 91% 100% 99% 99%  Weight:      Height:        Intake/Output Summary (Last 24 hours) at  05/26/2020 1301 Last data filed at 05/26/2020 1000 Gross per 24 hour  Intake 2190 ml  Output 2950 ml  Net -760 ml   Filed Weights   05/22/20 1100  Weight: 91.1 kg    Examination:  General exam: Appears calm and comfortable oral mucosa dry Respiratory system: Clear to auscultation. Respiratory effort normal. Cardiovascular system: S1 & S2 heard, RRR. No JVD, murmurs, rubs, gallops or clicks. No pedal edema. Gastrointestinal system: Abdomen is nondistended, soft and nontender. No organomegaly or masses felt. Normal bowel sounds heard. Central nervous system: Alert and oriented. No focal neurological deficits. Extremities: Right hip incision  covered with dressing skin: No rashes, lesions or ulcers Psychiatry: Judgement and insight appear normal. Mood & affect appropriate.     Data Reviewed: I have personally reviewed following labs and imaging studies  CBC: Recent Labs  Lab 05/22/20 0245 05/23/20 0311 05/24/20 0226 05/25/20 0250 05/26/20 0306  WBC 13.6* 11.7* 15.3* 17.3* 17.3*  HGB 11.7* 11.4* 10.0* 8.6* 8.9*  HCT 35.3* 34.8* 31.5* 26.9* 27.7*  MCV 89.6 89.9 93.2 92.1 92.0  PLT 229 202 227 204 412   Basic Metabolic Panel: Recent Labs  Lab 05/22/20 1405 05/23/20 0311 05/24/20 0226 05/25/20 0250 05/26/20 0306  NA 134* 131* 132* 126* 133*  K 4.5 4.1 4.6 4.5 4.2  CL 98 98 99 98 101  CO2 27 26 23  21* 24  GLUCOSE 135* 120* 127* 162* 138*  BUN 14 14 14 23 19   CREATININE 1.20 0.97 1.15 1.28* 0.88  CALCIUM 8.9 8.6* 8.2* 7.8* 8.5*   GFR: Estimated Creatinine Clearance: 71 mL/min (by C-G formula based on SCr of 0.88 mg/dL). Liver Function Tests: Recent Labs  Lab 05/25/20 0250 05/26/20 0306  AST 26 22  ALT 12 11  ALKPHOS 59 60  BILITOT 0.4 0.6  PROT 5.5* 5.8*  ALBUMIN 2.7* 2.9*   No results for input(s): LIPASE, AMYLASE in the last 168 hours. No results for input(s): AMMONIA in the last 168 hours. Coagulation Profile: No results for input(s): INR, PROTIME in the last 168 hours. Cardiac Enzymes: No results for input(s): CKTOTAL, CKMB, CKMBINDEX, TROPONINI in the last 168 hours. BNP (last 3 results) No results for input(s): PROBNP in the last 8760 hours. HbA1C: No results for input(s): HGBA1C in the last 72 hours. CBG: No results for input(s): GLUCAP in the last 168 hours. Lipid Profile: No results for input(s): CHOL, HDL, LDLCALC, TRIG, CHOLHDL, LDLDIRECT in the last 72 hours. Thyroid Function Tests: Recent Labs    05/25/20 1035  TSH 2.207   Anemia Panel: No results for input(s): VITAMINB12, FOLATE, FERRITIN, TIBC, IRON, RETICCTPCT in the last 72 hours. Sepsis Labs: No results for input(s):  PROCALCITON, LATICACIDVEN in the last 168 hours.  Recent Results (from the past 240 hour(s))  SARS Coronavirus 2 by RT PCR (hospital order, performed in [facility] hospital lab) Nasopharyngeal Nasal Mucosa     Status: None   Collection Time: 05/22/20  2:00 AM   Specimen: Nasal Mucosa; Nasopharyngeal  Result Value Ref Range Status   SARS Coronavirus 2 NEGATIVE NEGATIVE Final    Comment: (NOTE) SARS-CoV-2 target nucleic acids are NOT DETECTED.  The SARS-CoV-2 RNA is generally detectable in upper and lower respiratory specimens during the acute phase of infection. The lowest concentration of SARS-CoV-2 viral copies this assay can detect is 250 copies / mL. A negative result does not preclude SARS-CoV-2 infection and should not be used as the sole basis for treatment or other patient management decisions.  A negative result may occur with improper specimen collection / handling, submission of specimen other than nasopharyngeal swab, presence of viral mutation(s) within the areas targeted by this assay, and inadequate number of viral copies (<250 copies / mL). A negative result must be combined with clinical observations, patient history, and epidemiological information.  Fact Sheet for Patients:   StrictlyIdeas.no  Fact Sheet for Healthcare Providers: BankingDealers.co.za  This test is not yet approved or  cleared by the [place] FDA and has been authorized for detection and/or diagnosis of SARS-CoV-2 by FDA under an Emergency Use Authorization (EUA).  This EUA will remain in effect (meaning this test can be used) for the duration of the COVID-19 declaration under Section 564(b)(1) of the Act, 21 U.S.C. section 360bbb-3(b)(1), unless the authorization is terminated or revoked sooner.  Performed at [facility], [place] [address], [place], [place] [address]   MRSA PCR Screening     Status: None   Collection Time:  05/23/20 12:31 AM   Specimen: Nasal Mucosa; Nasopharyngeal  Result Value Ref Range Status   MRSA by PCR NEGATIVE NEGATIVE Final    Comment:        The GeneXpert MRSA Assay (FDA approved for NASAL specimens only), is one component of a comprehensive MRSA colonization surveillance program. It is not intended to diagnose MRSA infection nor to guide or monitor treatment for MRSA infections. Performed at [facility], [place] [address], [place], [place] [address]   Culture, blood (routine x 2)     Status: None (Preliminary result)   Collection Time: 05/25/20 10:35 AM   Specimen: BLOOD  Result Value Ref Range Status   Specimen Description   Final    BLOOD LEFT ANTECUBITAL Performed at [facility], [place] [address], [place], [place] [address]    Special Requests   Final    BOTTLES DRAWN AEROBIC AND ANAEROBIC Blood Culture adequate volume Performed at [place] [address], [place], [place] [address]    Culture   Final    NO GROWTH < 24 HOURS Performed at [facility] [address] [place], [place] [address]    Report Status PENDING  Incomplete  Culture, blood (routine x 2)     Status: None (Preliminary result)   Collection Time: 05/25/20 10:44 AM   Specimen: BLOOD LEFT ARM  Result Value Ref Range Status   Specimen Description   Final    BLOOD LEFT ARM Performed at [place] [address] [place], [place] [address]    Special Requests   Final    BOTTLES DRAWN AEROBIC AND ANAEROBIC Blood Culture adequate volume Performed at [place] [address] [place],  [address]    Culture   Final    NO GROWTH < 24 HOURS Performed at [facility] [address], [place],  [address]    Report Status PENDING  Incomplete         Radiology Studies: DG Chest 1 View  Result Date: 05/25/2020 CLINICAL DATA:  Two days postop following right hip replacement EXAM:  CHEST  1 VIEW COMPARISON:  05/21/2020 FINDINGS: Mild left basilar atelectasis is again noted and stable. No new focal infiltrate is noted. Small left effusion is seen as well. Aortic calcifications are noted. Cardiac shadow is stable. IMPRESSION: Mild left basilar atelectasis and effusions stable from the prior exam. Electronically Signed   By: Inez Catalina M.D.   On:  05/25/2020 12:29        Scheduled Meds: . (feeding supplement) PROSource Plus  30 mL Oral Daily  . acetaminophen  650 mg Oral TID  . alfuzosin  10 mg Oral QHS  . divalproex  125 mg Oral QHS  . docusate sodium  100 mg Oral BID  . enoxaparin (LOVENOX) injection  40 mg Subcutaneous Q24H  . feeding supplement (KATE FARMS STANDARD 1.4)  325 mL Oral Daily  . ferrous sulfate  325 mg Oral TID PC  . folic acid  660 mcg Oral QHS  . gabapentin  300 mg Oral TID  . levothyroxine  75 mcg Oral QAC breakfast  . memantine  5 mg Oral BID  . multivitamin with minerals  1 tablet Oral Daily  . pantoprazole  40 mg Oral Daily  . polyethylene glycol  17 g Oral Daily  . senna  1 tablet Oral BID   Continuous Infusions: . sodium chloride 75 mL/hr at 05/26/20 0900      Georgette Shell, MD  05/26/2020, 1:01 PM

## 2020-05-26 NOTE — Plan of Care (Signed)
Encouraging patient to eat meals and drink liquids to ensure adequate nutrition. Deanna Artis RN

## 2020-05-26 NOTE — Progress Notes (Signed)
Occupational Therapy Treatment Patient Details Name: Christian HALLAS Sr. MRN: [id number] DOB: [birth year] Today's Date: 05/26/2020    History of present illness 85 y.o. male with a past medical history of dementia, overactive bladder, essential hypertension, behavioral disturbances secondary to dementia, hypothyroidism and admitted with displaced right femoral neck fracture and s/p R THA with posterior approach 05/23/20   OT comments  Patient demonstrated mild confusion at beginning of treatment but did better with following commands and motor planning steps today. Patient exhibited improved ability to assist with supine to sit, standing and stand pivot with RW tor recliner. Patient required assistance of two for multimodal cues for powering up with standing, walker management, steadying, and  motor planning steps to recliner. Patient exhibited less pain today than yesterday. Patient able to perform grooming at side of bed and seated in recliner with setup.    Follow Up Recommendations  CIR    Equipment Recommendations  Other (comment)    Recommendations for Other Services      Precautions / Restrictions Precautions Precautions: Posterior Hip;Fall Precaution Comments: reviewed posterior hip precautions Restrictions Weight Bearing Restrictions: Yes RLE Weight Bearing: Weight bearing as tolerated       Mobility Bed Mobility Overal bed mobility: Needs Assistance Bed Mobility: Supine to Sit     Supine to sit: Max assist     General bed mobility comments: pt initiated bed mobility with verbal cues today, requiring assist to bring LEs over EOB, and scoot to EOB, pt able to better assist with trunk today - using hand hold to assist with pulling himself up.  Transfers Overall transfer level: Needs assistance Equipment used: Rolling walker (2 wheeled) Transfers: Sit to/from Stand Sit to Stand: Mod assist;Max assist;+2 physical assistance         General transfer comment: pt's feet  blocked, pt provided with cues for hand placement and weight shifting, tends to keep weight posteriorly in heels however able to correct with cues, performed sit to stand from bed, then again to transfer to recliner, and again from recliner.  Pt with improved sit to stand from recliner likely due to use of armrests.    Balance Overall balance assessment: Needs assistance Sitting-balance support: No upper extremity supported;Feet supported Sitting balance-Leahy Scale: Fair     Standing balance support: Bilateral upper extremity supported;During functional activity Standing balance-Leahy Scale: Poor                             ADL either performed or assessed with clinical judgement   ADL       Grooming: Sitting;Set up;Wash/dry face;Oral care Grooming Details (indicate cue type and reason): Washed face and hands sitting at side of bed. Patient performed oral care sitting in recliner with set up. Did not require verbal cues.                                     Vision   Vision Assessment?: No apparent visual deficits   Perception     Praxis      Cognition Arousal/Alertness: Awake/alert Behavior During Therapy: WFL for tasks assessed/performed Overall Cognitive Status: History of cognitive impairments - at baseline                                 General Comments: Hx of dementia.  Patient's conversation predominantly appropriate though he is a little confused.        Exercises     Shoulder Instructions       General Comments      Pertinent Vitals/ Pain       Pain Assessment: Faces Faces Pain Scale: Hurts a little bit Pain Location: right hip Pain Descriptors / Indicators: Grimacing;Guarding Pain Intervention(s): Monitored during session  Home Living                                          Prior Functioning/Environment              Frequency  Min 2X/week        Progress Toward Goals  OT  Goals(current goals can now be found in the care plan section)  Progress towards OT goals: Progressing toward goals  Acute Rehab OT Goals Patient Stated Goal: To go to rehab OT Goal Formulation: With patient/family Time For Goal Achievement: 06/08/20 Potential to Achieve Goals: Good  Plan Discharge plan remains appropriate    Co-evaluation    PT/OT/SLP Co-Evaluation/Treatment: Yes Reason for Co-Treatment: For patient/therapist safety;To address functional/ADL transfers PT goals addressed during session: Mobility/safety with mobility OT goals addressed during session: ADL's and self-care      AM-PAC OT "6 Clicks" Daily Activity     Outcome Measure   Help from another person eating meals?: None Help from another person taking care of personal grooming?: A Little Help from another person toileting, which includes using toliet, bedpan, or urinal?: Total Help from another person bathing (including washing, rinsing, drying)?: A Lot Help from another person to put on and taking off regular upper body clothing?: A Little Help from another person to put on and taking off regular lower body clothing?: Total 6 Click Score: 14    End of Session Equipment Utilized During Treatment: Gait belt;Rolling walker  OT Visit Diagnosis: Unsteadiness on feet (R26.81);Other abnormalities of gait and mobility (R26.89);Muscle weakness (generalized) (M62.81);Pain Pain - Right/Left: Right Pain - part of body: Hip   Activity Tolerance Patient tolerated treatment well   Patient Left in chair;with call bell/phone within reach;with chair alarm set   Nurse Communication Mobility status        Time: 2376-2831 OT Time Calculation (min): 23 min  Charges: OT General Charges $OT Visit: 1 Visit OT Treatments $Self Care/Home Management : 8-22 mins  Derl Barrow, OTR/L [place]  Office [phone] Pager: [place] 05/26/2020, 1:49 PM

## 2020-05-26 NOTE — Progress Notes (Signed)
Physical Therapy Treatment Patient Details Name: Christian WOOLLARD Sr. MRN: [id number] DOB: [birth year] Today's Date: 05/26/2020    History of Present Illness 85 y.o. male with a past medical history of dementia, overactive bladder, essential hypertension, behavioral disturbances secondary to dementia, hypothyroidism and admitted with displaced right femoral neck fracture and s/p R THA with posterior approach 05/23/20    PT Comments    Pt on BSC on arrival.  Pt assisted with standing and OT performed pericare.  Pt fatigued quickly and required rest break.   Pt assisted back to bed with stand pivot although pt has great difficulty shifting feet/weight and requiring more assist for guiding hips to bed surface.  Pt repositioned in bed, cleaned hands, and meal tray placed in front of pt.    Follow Up Recommendations  CIR;Supervision/Assistance - 24 hour     Equipment Recommendations  Other (comment) (per next venue)    Recommendations for Other Services       Precautions / Restrictions Precautions Precautions: Posterior Hip;Fall Precaution Comments: reviewed posterior hip precautions Restrictions Weight Bearing Restrictions: Yes RLE Weight Bearing: Weight bearing as tolerated    Mobility  Bed Mobility Overal bed mobility: Needs Assistance Bed Mobility: Sit to Supine     Supine to sit: Max assist Sit to supine: +2 for physical assistance;Max assist   General bed mobility comments: assist for lower body, pt assisting as able with upper body, pt also assisted a little with repositioning once in supine  Transfers Overall transfer level: Needs assistance Equipment used: Rolling walker (2 wheeled) Transfers: Sit to/from [facility] Sit to Stand: Max assist;+2 physical assistance         General transfer comment: pt's feet blocked, pt provided with cues for hand placement and weight shifting, tends to keep weight posteriorly in heels however able to correct with  cues, performed sit to stand from from [facility] and OT performed pericare, pt fatigued quickly and required seated rest break, pt then assisted with stand pivot BSC to bed, pt with more difficulty weight shifting and moving feet this afternoon  Ambulation/Gait                 Stairs             Wheelchair Mobility    Modified Rankin (Stroke Patients Only)       Balance Overall balance assessment: Needs assistance Sitting-balance support: No upper extremity supported;Feet supported Sitting balance-Leahy Scale: Fair     Standing balance support: Bilateral upper extremity supported;During functional activity Standing balance-Leahy Scale: Poor                              Cognition Arousal/Alertness: Awake/alert Behavior During Therapy: WFL for tasks assessed/performed Overall Cognitive Status: History of cognitive impairments - at baseline                                 General Comments: hx of dementia, more on topic conversation this afternoon      Exercises      General Comments        Pertinent Vitals/Pain Pain Assessment: Faces Faces Pain Scale: Hurts even more Pain Location: right hip Pain Descriptors / Indicators: Grimacing;Guarding Pain Intervention(s): Repositioned;Monitored during session    Home Living  Prior Function            PT Goals (current goals can now be found in the care plan section) Acute Rehab PT Goals Patient Stated Goal: To go to rehab Progress towards PT goals: Progressing toward goals    Frequency    Min 5X/week      PT Plan Current plan remains appropriate    Co-evaluation PT/OT/SLP Co-Evaluation/Treatment: Yes Reason for Co-Treatment: To address functional/ADL transfers PT goals addressed during session: Mobility/safety with mobility OT goals addressed during session: ADL's and self-care      AM-PAC PT "6 Clicks" Mobility   Outcome Measure  Help  needed turning from your back to your side while in a flat bed without using bedrails?: A Lot Help needed moving from lying on your back to sitting on the side of a flat bed without using bedrails?: A Lot Help needed moving to and from a bed to a chair (including a wheelchair)?: A Lot Help needed standing up from a chair using your arms (e.g., wheelchair or bedside chair)?: A Lot Help needed to walk in hospital room?: Total Help needed climbing 3-5 steps with a railing? : Total 6 Click Score: 10    End of Session Equipment Utilized During Treatment: Gait belt Activity Tolerance: Patient tolerated treatment well;Patient limited by fatigue Patient left: with call bell/phone within reach;in bed;with bed alarm set Nurse Communication: Mobility status PT Visit Diagnosis: Other abnormalities of gait and mobility (R26.89)     Time: 8864-8472 PT Time Calculation (min) (ACUTE ONLY): 23 min  Charges:  $Therapeutic Activity: 8-22 mins                    Jannette Spanner PT, DPT Acute Rehabilitation Services Pager: [phone] Office: [phone]  York Ram E 05/26/2020, 3:03 PM

## 2020-05-26 NOTE — Progress Notes (Signed)
Occupational Therapy Treatment Patient Details Name: Christian Miller Sr. MRN: [id number] DOB: 1934/11/01 Today's Date: 05/26/2020    History of present illness 85 y.o. male with a past medical history of dementia, overactive bladder, essential hypertension, behavioral disturbances secondary to dementia, hypothyroidism and admitted with displaced right femoral neck fracture and s/p R THA with posterior approach 05/23/20   OT comments  Patient on [facility] when therapist entered room. Patient performed wiping in seated position but with poor quality requiring max assist for pericare and clothing management. Required max assist x 2 for standing and transfer to bed. Patient exhibited difficulty weight shifting and taking steps this afternoon. Patient also fatigued and required sitting break before transfer to bed. Patient able to assist with transfer into supine and use of trapeze bar for bed mobility. Cont POC.   Follow Up Recommendations  SNF (TOC note reports insurance denied CIR. Rec SNF at discharge.)    Equipment Recommendations  Other (comment)    Recommendations for Other Services      Precautions / Restrictions Precautions Precautions: Posterior Hip;Fall Precaution Comments: reviewed posterior hip precautions Restrictions Weight Bearing Restrictions: Yes RLE Weight Bearing: Weight bearing as tolerated       Mobility Bed Mobility Overal bed mobility: Needs Assistance Bed Mobility: Sit to Supine       Sit to supine: +2 for physical assistance;Max assist   General bed mobility comments: assist for lower body, pt assisting as able with upper body, pt also assisted a little with repositioning once in supine  Transfers Overall transfer level: Needs assistance Equipment used: Rolling walker (2 wheeled) Transfers: Sit to/from [facility] Sit to Stand: Max assist;+2 physical assistance         General transfer comment: pt's feet blocked, pt provided with cues for  hand placement and weight shifting, tends to keep weight posteriorly in heels however able to correct with cues, performed sit to stand from from [facility] and OT performed pericare, pt fatigued quickly and required seated rest break, pt then assisted with stand pivot BSC to bed, pt with more difficulty weight shifting and moving feet this afternoon    Balance                                           ADL either performed or assessed with clinical judgement   ADL         Grooming Details (indicate cue type and reason): Assistance for washing hands after BM             Lower Body Dressing: Total assistance Lower Body Dressing Details (indicate cue type and reason): To doff socks and donn shoes prior to standing. Toilet Transfer: +2 for physical assistance;Maximal assistance;BSC;RW;+2 for safety/equipment Toilet Transfer Details (indicate cue type and reason): +2 to stand from [facility] and then to pivot to recliner. Patient exhibited difficulty weight shifting, mainting extended knees and using RW correctly. Toileting- Clothing Manipulation and Hygiene: +2 for physical assistance;+2 for safety/equipment;Sit to/from stand;Maximal assistance Toileting - Clothing Manipulation Details (indicate cue type and reason): Assistance for percare and maintaining standing balance and position. Patinet wiped himself in seated position but not with good quality. Also got BM on leg and hand.             Vision       Quarry manager  Arousal/Alertness: Awake/alert Behavior During Therapy: WFL for tasks assessed/performed Overall Cognitive Status: History of cognitive impairments - at baseline                                 General Comments: hx of dementia, more on topic conversation this afternoon        Exercises     Shoulder Instructions       General Comments      Pertinent Vitals/ Pain       Pain Assessment: Faces Faces Pain Scale:  Hurts even more Pain Location: right hip Pain Descriptors / Indicators: Grimacing;Guarding Pain Intervention(s): Limited activity within patient's tolerance  Home Living                                          Prior Functioning/Environment              Frequency  Min 2X/week        Progress Toward Goals  OT Goals(current goals can now be found in the care plan section)  Progress towards OT goals: Progressing toward goals  Acute Rehab OT Goals Patient Stated Goal: To go to rehab OT Goal Formulation: With patient/family Time For Goal Achievement: 06/08/20 Potential to Achieve Goals: Good  Plan Discharge plan remains appropriate    Co-evaluation    PT/OT/SLP Co-Evaluation/Treatment: Yes Reason for Co-Treatment: For patient/therapist safety;To address functional/ADL transfers PT goals addressed during session: Mobility/safety with mobility OT goals addressed during session: ADL's and self-care      AM-PAC OT "6 Clicks" Daily Activity     Outcome Measure   Help from another person eating meals?: None Help from another person taking care of personal grooming?: A Little Help from another person toileting, which includes using toliet, bedpan, or urinal?: A Lot Help from another person bathing (including washing, rinsing, drying)?: A Lot Help from another person to put on and taking off regular upper body clothing?: A Little Help from another person to put on and taking off regular lower body clothing?: Total 6 Click Score: 15    End of Session Equipment Utilized During Treatment: Rolling walker;Gait belt  OT Visit Diagnosis: Unsteadiness on feet (R26.81);Other abnormalities of gait and mobility (R26.89);Muscle weakness (generalized) (M62.81);Pain Pain - Right/Left: Right Pain - part of body: Hip   Activity Tolerance Patient limited by fatigue   Patient Left with call bell/phone within reach;in bed;with bed alarm set   Nurse Communication  Mobility status        Time: 1355-1417 OT Time Calculation (min): 22 min  Charges: OT General Charges $OT Visit:  (OT in room for visit but not enough minutes to charge.)  Derl Barrow, OTR/[facility] [place]  Office [phone] Pager: [place] 05/26/2020, 6:07 PM

## 2020-05-27 ENCOUNTER — Inpatient Hospital Stay (HOSPITAL_COMMUNITY): Payer: PPO

## 2020-05-27 LAB — CBC
HCT: 26.4 % — ABNORMAL LOW (ref 39.0–52.0)
Hemoglobin: 8.5 g/dL — ABNORMAL LOW (ref 13.0–17.0)
MCH: 29.4 pg (ref 26.0–34.0)
MCHC: 32.2 g/dL (ref 30.0–36.0)
MCV: 91.3 fL (ref 80.0–100.0)
Platelets: 262 10*3/uL (ref 150–400)
RBC: 2.89 MIL/uL — ABNORMAL LOW (ref 4.22–5.81)
RDW: 13.1 % (ref 11.5–15.5)
WBC: 15.1 10*3/uL — ABNORMAL HIGH (ref 4.0–10.5)
nRBC: 0 % (ref 0.0–0.2)

## 2020-05-27 LAB — COMPREHENSIVE METABOLIC PANEL
ALT: 11 U/L (ref 0–44)
AST: 23 U/L (ref 15–41)
Albumin: 2.6 g/dL — ABNORMAL LOW (ref 3.5–5.0)
Alkaline Phosphatase: 55 U/L (ref 38–126)
Anion gap: 7 (ref 5–15)
BUN: 20 mg/dL (ref 8–23)
CO2: 23 mmol/L (ref 22–32)
Calcium: 7.6 mg/dL — ABNORMAL LOW (ref 8.9–10.3)
Chloride: 97 mmol/L — ABNORMAL LOW (ref 98–111)
Creatinine, Ser: 0.91 mg/dL (ref 0.61–1.24)
GFR calc Af Amer: >60 mL/min (ref >60)
GFR calc non Af Amer: >60 mL/min (ref >60)
Glucose, Bld: 100 mg/dL — ABNORMAL HIGH (ref 70–99)
Potassium: 3.8 mmol/L (ref 3.5–5.1)
Sodium: 127 mmol/L — ABNORMAL LOW (ref 135–145)
Total Bilirubin: 0.5 mg/dL (ref 0.3–1.2)
Total Protein: 5.6 g/dL — ABNORMAL LOW (ref 6.5–8.1)

## 2020-05-27 LAB — OSMOLALITY: Osmolality: 281 mOsm/kg (ref 275–295)

## 2020-05-27 LAB — HIV ANTIBODY (ROUTINE TESTING W REFLEX): HIV Screen 4th Generation wRfx: NONREACTIVE

## 2020-05-27 LAB — SODIUM, URINE, RANDOM: Sodium, Ur: 66 mmol/L

## 2020-05-27 LAB — FOLATE: Folate: 36.8 ng/mL (ref >5.9)

## 2020-05-27 LAB — OSMOLALITY, URINE: Osmolality, Ur: 306 mOsm/kg (ref 300–900)

## 2020-05-27 LAB — CORTISOL-AM, BLOOD: Cortisol - AM: 24.7 ug/dL — ABNORMAL HIGH (ref 6.7–22.6)

## 2020-05-27 LAB — VALPROIC ACID LEVEL: Valproic Acid Lvl: <10 ug/mL — ABNORMAL LOW (ref 50.0–100.0)

## 2020-05-27 LAB — VITAMIN B12: Vitamin B-12: 556 pg/mL (ref 180–914)

## 2020-05-27 MED ORDER — ALFUZOSIN HCL ER 10 MG PO TB24
10.0000 mg | ORAL_TABLET | Freq: Two times a day (BID) | ORAL | Status: DC
  Administered 2020-05-27 – 2020-05-30 (×6): 10 mg via ORAL
  Filled 2020-05-27 (×7): qty 1

## 2020-05-27 MED ORDER — SODIUM CHLORIDE 0.9 % IV SOLN
2.0000 g | Freq: Three times a day (TID) | INTRAVENOUS | Status: DC
  Administered 2020-05-27 – 2020-05-30 (×9): 2 g via INTRAVENOUS
  Filled 2020-05-27 (×12): qty 2

## 2020-05-27 MED ORDER — LOPERAMIDE HCL 2 MG PO CAPS
2.0000 mg | ORAL_CAPSULE | Freq: Four times a day (QID) | ORAL | Status: DC | PRN
  Administered 2020-05-27 (×2): 2 mg via ORAL
  Filled 2020-05-27 (×2): qty 1

## 2020-05-27 MED ORDER — SODIUM CHLORIDE 0.9 % IV SOLN
INTRAVENOUS | Status: DC | PRN
  Administered 2020-05-27: 1000 mL via INTRAVENOUS
  Administered 2020-05-28 – 2020-05-29 (×2): 250 mL via INTRAVENOUS

## 2020-05-27 MED ORDER — AMLODIPINE BESYLATE 5 MG PO TABS
5.0000 mg | ORAL_TABLET | Freq: Every day | ORAL | Status: DC
  Administered 2020-05-27 – 2020-05-28 (×2): 5 mg via ORAL
  Filled 2020-05-27 (×2): qty 1

## 2020-05-27 MED ORDER — HALOPERIDOL LACTATE 5 MG/ML IJ SOLN: 0.5000 mg | Freq: Four times a day (QID) | INTRAMUSCULAR | Status: DC | PRN

## 2020-05-27 NOTE — Progress Notes (Addendum)
CSW called the pt's wife who states that although she was informed by the CSW that their first choice [facility] has not extended a bed offer nor are they open on the weekend thereby making D/C there impossible, she does not want the two choices offered to her for her husband:  1. [facility]  2. [facility]  CSW informed pt's wife that [facility] likely does nor accept pt's on the weekend and communication with admissions there is not possible on the weekend s but called to affirm that and did no receive a call back from [facility] which is pt's first choice.  CSW updated pt's doctor who stated pt is is not ready for D/C until likely Monday.  Pt's spouse updated and is "hoping for [facility]" or something comparable.  Weekend CSW will leave handoff for 1st shift TOC CSW/RN CN.  Please reconsult if future social work needs arise.  CSW signing off, as social work intervention is no longer needed.  Alphonse Guild. Katie Moch  MSW, LCSW, LCAS, CCS Transitions of Care Clinical Social Worker Care Coordination Department Ph: [phone]

## 2020-05-27 NOTE — Progress Notes (Signed)
CSW received a call from [facility] that they need pt's accepting facility at ph: [phone].  CSW updated [facility] facility not reflected in the chart.  Per [facility] the ins Josem Kaufmann was called in/requested on 8/13 but CSW does not see a note reflecting this.  Per chart review pt was accepted to [facility] in [place] and [place].  CSW called family to receive their choice and left a message at pg: [phone].  CSW will continue to follow for D/C needs.  Alphonse Guild. Datra Clary  MSW, LCSW, LCAS, CCS Transitions of Care Clinical Social Worker Care Coordination Department Ph: [phone]

## 2020-05-27 NOTE — Progress Notes (Addendum)
PROGRESS NOTE    Christian Miller  GLO:[id number] DOB: 15-Oct-1934 DOA: 05/21/2020 PCP: [name], MD    Brief Narrative:Christian Miller. is a 85 y.o.malewith a past medical history of dementia, overactive bladder, essential hypertension, behavioral disturbances secondary to dementia, hypothyroidism who was in his usual state of health till yesterday when he was in the kitchen and he lost his balance and he fell.  Evaluation at [facility] revealed right hip fracture. There was no orthopedic coverage at that facility and thus, [facility].  Assessment & Plan:   Principal Problem:   Closed right hip fracture [facility]) Active Problems:   HTN (hypertension)   OAB (overactive bladder)   Dementia without behavioral disturbance ([place])   #1 Right Hip fracture status post right total hip arthroplasty  05/23/2020. insurance refused CIR.  Now looking for SNF. Per wife he still remains confused he is talking things out of his head he was not like that prior to admission.  In the past when he had surgery patient had a confusion which lasted for a week or 2 which got improved on its own.  At this time there is no evidence of active infection noted anywhere.  His white count still remains elevated at 17.5.  I have stopped all the narcotics Zyprexa Wife concerned that he is very confused.    #2 Essential HTN -blood pressure 156/92 restart Norvasc.  DC IV fluids.  He was hypotensive and was on IV fluids and Norvasc was being on hold.    #3Hypothyroidsm continue Synthroid TSH normal  #4 neuropathy on gabapentin  #5 dementia without behavioral disturbance-dc zyprexa.  Continue Depakote Namenda and Zoloft.  Patient was not on Zyprexa at home.  #6 overactive bladder/bph -continue alfuzosin.  #7 hyponatremia sodium 127.  Check urine and serum osmolality and urine sodium.  Check cortisol level.  #8 leukocytosis -improving off antibiotics white count 15 down  from 17.3.    #9 AKI resolved creatinine 0.91.   #18  Acute metabolic encephalopathy -altered mental status/intention tremors no etiology identified yet check CT head to rule out any acute process.  Most likely this is all related to in the setting of dementia with extra stress of surgery and new location. Patient still has leukocytosis and has new onset cough today with bibasilar atelectasis and effusion I will start him on antibiotics to cover for possible pneumonia   Nutrition Problem: Increased nutrient needs Etiology: post-op healing     Signs/Symptoms: estimated needs    Interventions: MVI, Other (Comment) (ProSource Plus; [facility] [place])  Estimated body mass index is 27.24 kg/m as calculated from the following:   Height as of this encounter: 6' (1.829 m).   Weight as of this encounter: 91.1 kg.  DVT prophylaxis: Lovenox  code Status: Full code Family Communication discussed with patient's wife Disposition Plan:  Status is: Inpatient status post right hip arthroplasty pending snf  Dispo: The patient is from: Home              Anticipated d/c is to: SNF               Anticipated d/c date is: Unknown              Patient currently is not medically stable to be discharged.    Consultants: Ortho Procedures: Right hip arthroplasty 05/23/2020 Antimicrobials: None  Subjective:  Patient resting in bed he was awake and alert when I saw him  this morning.  Discussed with the night nurse.  He reported that he slept well had some nightmares no other issues were reported.   objective: Vitals:   05/26/20 0618 05/26/20 1336 05/26/20 2117 05/27/20 0550  BP: 124/70 (!) 116/58 (!) 120/55 (!) 156/92  Pulse: 70 70 65 67  Resp: 16 18 17 18   Temp: 98.4 F (36.9 C) 98.9 F (37.2 C) 98.2 F (36.8 C) 98.7 F (37.1 C)  TempSrc:  Oral Oral Oral  SpO2: 99% 96% 92% 94%  Weight:      Height:        Intake/Output Summary (Last 24 hours) at 05/27/2020 1036 Last data filed at 05/27/2020  0804 Gross per 24 hour  Intake 2100 ml  Output 1800 ml  Net 300 ml   Filed Weights   05/22/20 1100  Weight: 91.1 kg    Examination:  General exam: Appears calm and comfortable oral mucosa dry Respiratory system: Clear to auscultation. Respiratory effort normal. Cardiovascular system: S1 & S2 heard, RRR. No JVD, murmurs, rubs, gallops or clicks. No pedal edema. Gastrointestinal system: Abdomen is nondistended, soft and nontender. No organomegaly or masses felt. Normal bowel sounds heard. Central nervous system: Alert and oriented. No focal neurological deficits. Extremities: Right hip incision covered with dressing skin: No rashes, lesions or ulcers Psychiatry: Judgement and insight appear normal. Mood & affect appropriate.     Data Reviewed: I have personally reviewed following labs and imaging studies  CBC: Recent Labs  Lab 05/23/20 0311 05/24/20 0226 05/25/20 0250 05/26/20 0306 05/27/20 0720  WBC 11.7* 15.3* 17.3* 17.3* 15.1*  HGB 11.4* 10.0* 8.6* 8.9* 8.5*  HCT 34.8* 31.5* 26.9* 27.7* 26.4*  MCV 89.9 93.2 92.1 92.0 91.3  PLT 202 227 204 216 875   Basic Metabolic Panel: Recent Labs  Lab 05/23/20 0311 05/24/20 0226 05/25/20 0250 05/26/20 0306 05/27/20 0245  NA 131* 132* 126* 133* 127*  K 4.1 4.6 4.5 4.2 3.8  CL 98 99 98 101 97*  CO2 26 23 21* 24 23  GLUCOSE 120* 127* 162* 138* 100*  BUN 14 14 23 19 20   CREATININE 0.97 1.15 1.28* 0.88 0.91  CALCIUM 8.6* 8.2* 7.8* 8.5* 7.6*   GFR: Estimated Creatinine Clearance: 68.7 mL/min (by C-G formula based on SCr of 0.91 mg/dL). Liver Function Tests: Recent Labs  Lab 05/25/20 0250 05/26/20 0306 05/27/20 0245  AST 26 22 23   ALT 12 11 11   ALKPHOS 59 60 55  BILITOT 0.4 0.6 0.5  PROT 5.5* 5.8* 5.6*  ALBUMIN 2.7* 2.9* 2.6*   No results for input(s): LIPASE, AMYLASE in the last 168 hours. No results for input(s): AMMONIA in the last 168 hours. Coagulation Profile: No results for input(s): INR, PROTIME in the  last 168 hours. Cardiac Enzymes: No results for input(s): CKTOTAL, CKMB, CKMBINDEX, TROPONINI in the last 168 hours. BNP (last 3 results) No results for input(s): PROBNP in the last 8760 hours. HbA1C: No results for input(s): HGBA1C in the last 72 hours. CBG: No results for input(s): GLUCAP in the last 168 hours. Lipid Profile: No results for input(s): CHOL, HDL, LDLCALC, TRIG, CHOLHDL, LDLDIRECT in the last 72 hours. Thyroid Function Tests: Recent Labs    05/25/20 1035  TSH 2.207   Anemia Panel: No results for input(s): VITAMINB12, FOLATE, FERRITIN, TIBC, IRON, RETICCTPCT in the last 72 hours. Sepsis Labs: No results for input(s): PROCALCITON, LATICACIDVEN in the last 168 hours.  Recent Results (from the past 240 hour(s))  SARS Coronavirus 2 by RT  PCR (hospital order, performed in [facility] hospital lab) Nasopharyngeal Nasal Mucosa     Status: None   Collection Time: 05/22/20  2:00 AM   Specimen: Nasal Mucosa; Nasopharyngeal  Result Value Ref Range Status   SARS Coronavirus 2 NEGATIVE NEGATIVE Final    Comment: (NOTE) SARS-CoV-2 target nucleic acids are NOT DETECTED.  The SARS-CoV-2 RNA is generally detectable in upper and lower respiratory specimens during the acute phase of infection. The lowest concentration of SARS-CoV-2 viral copies this assay can detect is 250 copies / mL. A negative result does not preclude SARS-CoV-2 infection and should not be used as the sole basis for treatment or other patient management decisions.  A negative result may occur with improper specimen collection / handling, submission of specimen other than nasopharyngeal swab, presence of viral mutation(s) within the areas targeted by this assay, and inadequate number of viral copies (<250 copies / mL). A negative result must be combined with clinical observations, patient history, and epidemiological information.  Fact Sheet for Patients:   StrictlyIdeas.no  Fact  Sheet for Healthcare Providers: BankingDealers.co.za  This test is not yet approved or  cleared by the [place] FDA and has been authorized for detection and/or diagnosis of SARS-CoV-2 by FDA under an Emergency Use Authorization (EUA).  This EUA will remain in effect (meaning this test can be used) for the duration of the COVID-19 declaration under Section 564(b)(1) of the Act, 21 U.S.C. section 360bbb-3(b)(1), unless the authorization is terminated or revoked sooner.  Performed at [facility], [place] [address] [place], [place] [address]   MRSA PCR Screening     Status: None   Collection Time: 05/23/20 12:31 AM   Specimen: Nasal Mucosa; Nasopharyngeal  Result Value Ref Range Status   MRSA by PCR NEGATIVE NEGATIVE Final    Comment:        The GeneXpert MRSA Assay (FDA approved for NASAL specimens only), is one component of a comprehensive MRSA colonization surveillance program. It is not intended to diagnose MRSA infection nor to guide or monitor treatment for MRSA infections. Performed at [facility], [place] [address], [place], [place] [address]   Culture, blood (routine x 2)     Status: None (Preliminary result)   Collection Time: 05/25/20 10:35 AM   Specimen: BLOOD  Result Value Ref Range Status   Specimen Description   Final    BLOOD LEFT ANTECUBITAL Performed at [facility], [place] [address], [place], [place] [address]    Special Requests   Final    BOTTLES DRAWN AEROBIC AND ANAEROBIC Blood Culture adequate volume Performed at [facility] [address] [place], [place] [address]    Culture   Final    NO GROWTH < 24 HOURS Performed at [facility] [address], [place], [place] [address]    Report Status PENDING  Incomplete  Culture, blood (routine x 2)     Status: None (Preliminary result)   Collection Time: 05/25/20 10:44 AM   Specimen: BLOOD LEFT ARM  Result  Value Ref Range Status   Specimen Description   Final    BLOOD LEFT ARM Performed at [facility] [address] [place], [place] [address]    Special Requests   Final    BOTTLES DRAWN AEROBIC AND ANAEROBIC Blood Culture adequate volume Performed at [facility] [address], [place], [place] [address]    Culture   Final  NO GROWTH < 24 HOURS Performed at [facility] [address] [place], [place] [address]    Report Status PENDING  Incomplete         Radiology Studies: DG Chest 1 View  Result Date: 05/25/2020 CLINICAL DATA:  Two days postop following right hip replacement EXAM: CHEST  1 VIEW COMPARISON:  05/21/2020 FINDINGS: Mild left basilar atelectasis is again noted and stable. No new focal infiltrate is noted. Small left effusion is seen as well. Aortic calcifications are noted. Cardiac shadow is stable. IMPRESSION: Mild left basilar atelectasis and effusions stable from the prior exam. Electronically Signed   By: Inez Catalina M.D.   On: 05/25/2020 12:29        Scheduled Meds:  (feeding supplement) PROSource Plus  30 mL Oral Daily   acetaminophen  650 mg Oral TID   alfuzosin  10 mg Oral QHS   divalproex  125 mg Oral QHS   docusate sodium  100 mg Oral BID   donepezil  5 mg Oral Daily   enoxaparin (LOVENOX) injection  40 mg Subcutaneous Q24H   feeding supplement (K[employer]MS STANDARD 1.4)  325 mL Oral Daily   ferrous sulfate  325 mg Oral TID PC   folic acid  469 mcg Oral QHS   gabapentin  300 mg Oral TID   levothyroxine  75 mcg Oral QAC breakfast   memantine  5 mg Oral BID   multivitamin with minerals  1 tablet Oral Daily   pantoprazole  40 mg Oral Daily   polyethylene glycol  17 g Oral Daily   senna  1 tablet Oral BID   Continuous Infusions:     Georgette Shell, MD  05/27/2020, 10:36 AM

## 2020-05-27 NOTE — Progress Notes (Signed)
Physical Therapy Treatment Patient Details Name: Christian BOYAR Sr. MRN: [id number] DOB: 10-23-1934 Today's Date: 05/27/2020    History of Present Illness 85 y.o. male with a past medical history of dementia, overactive bladder, essential hypertension, behavioral disturbances secondary to dementia, hypothyroidism and admitted with displaced right femoral neck fracture and s/p R THA with posterior approach 05/23/20    PT Comments    Pt continues to require +2 assistance for mobility. Mild-Mod pain with activity. Pt remains at high risk for falls. Per chart review, CIR denied. Updated recommendation is for SNF for rehab.   Follow Up Recommendations  SNF (CIR denied)     Equipment Recommendations   (TBD at next venue)    Recommendations for Other Services       Precautions / Restrictions Precautions Precautions: Posterior Hip;Fall Precaution Comments: reviewed posterior hip precautions Restrictions Weight Bearing Restrictions: No RLE Weight Bearing: Weight bearing as tolerated    Mobility  Bed Mobility Overal bed mobility: Needs Assistance Bed Mobility: Supine to Sit     Supine to sit: Mod assist;+2 for physical assistance;+2 for safety/equipment;HOB elevated Sit to supine: Mod assist;+2 for physical assistance;+2 for safety/equipment;HOB elevated   General bed mobility comments: Assist for trunk and LEs. Utilized bedpad to aid with scooting, positioning. Multimodal cueing required.  Transfers Overall transfer level: Needs assistance Equipment used: Rolling walker (2 wheeled) Transfers: Sit to/from Stand Max assist; +2 for physical assistance; +2 for safety/equipment         General transfer comment: x2. Poor anterior weightshifting. Both feet blocked. Posterior bias. Multimodal cueing required for posture, technique, weightshifting. Pt declined sitting in recliner this session.   Ambulation/Gait Ambulation/Gait assistance: Mod assist;+2 physical assistance;+2  safety/equipment   Assistive device: Rolling walker (2 wheeled)       General Gait Details: side steps along side of bed with RW. Posteior bias with flexed trunk/hip positioning. Multimodal cueing for safety, technique, posture, RW management. Assist to stabilize/support pt and move RW.   Stairs             Wheelchair Mobility    Modified Rankin (Stroke Patients Only)       Balance Overall balance assessment: Needs assistance         Standing balance support: Bilateral upper extremity supported Standing balance-Leahy Scale: Poor                              Cognition Arousal/Alertness: Awake/alert Behavior During Therapy: WFL for tasks assessed/performed Overall Cognitive Status: History of cognitive impairments - at baseline                         Following Commands: Follows one step commands with increased time     Problem Solving: Slow processing;Difficulty sequencing;Requires verbal cues;Requires tactile cues General Comments: hx of dementia      Exercises      General Comments        Pertinent Vitals/Pain Pain Assessment: Faces Faces Pain Scale: Hurts even more Pain Location: right hip Pain Descriptors / Indicators: Aching;Discomfort;Sore;Grimacing;Guarding Pain Intervention(s): Limited activity within patient's tolerance;Monitored during session;Repositioned    Home Living                      Prior Function            PT Goals (current goals can now be found in the care plan section) Progress towards PT goals:  Progressing toward goals    Frequency    Min 3X/week      PT Plan Frequency needs to be updated;Discharge plan needs to be updated    Co-evaluation              AM-PAC PT "6 Clicks" Mobility   Outcome Measure  Help needed turning from your back to your side while in a flat bed without using bedrails?: A Lot Help needed moving from lying on your back to sitting on the side of a flat  bed without using bedrails?: A Lot Help needed moving to and from a bed to a chair (including a wheelchair)?: A Lot Help needed standing up from a chair using your arms (e.g., wheelchair or bedside chair)?: A Lot Help needed to walk in hospital room?: Total Help needed climbing 3-5 steps with a railing? : Total 6 Click Score: 10    End of Session Equipment Utilized During Treatment: Gait belt Activity Tolerance: Patient tolerated treatment well Patient left: in bed;with call bell/phone within reach;with bed alarm set   PT Visit Diagnosis: History of falling (Z91.81);Muscle weakness (generalized) (M62.81);Other abnormalities of gait and mobility (R26.89);Pain Pain - Right/Left: Right Pain - part of body: Hip     Time: 5790-3833 PT Time Calculation (min) (ACUTE ONLY): 29 min  Charges:  $Gait Training: 8-22 mins $Therapeutic Activity: 8-22 mins                         Doreatha Massed, PT Acute Rehabilitation  Office: [phone] Pager: [phone]

## 2020-05-27 NOTE — Progress Notes (Signed)
Pharmacy Antibiotic Note  Christian Miller. is a 85 y.o. male with hx dementia presented to [facility] s/p fall and was found to have right hip fracture.  She was transferred to [facility] on 05/21/2020 and underwent post right total hip arthroplasty 05/23/2020.  Pharmacy is consulted to start cefepime on 8/14 for PNA.  Today, 05/27/2020: - afeb, wbc down 15.1 - scr 0.91 (crcl~69)   Plan: Cefepime 2gm IV q8h  ___________________________________________  Height: 6' (182.9 cm) Weight: 91.1 kg (200 lb 13.4 oz) IBW/kg (Calculated) : 77.6  Temp (24hrs), Avg:98.6 F (37 C), Min:98.2 F (36.8 C), Max:98.9 F (37.2 C)  Recent Labs  Lab 05/23/20 0311 05/24/20 0226 05/25/20 0250 05/26/20 0306 05/27/20 0245 05/27/20 0720  WBC 11.7* 15.3* 17.3* 17.3*  --  15.1*  CREATININE 0.97 1.15 1.28* 0.88 0.91  --     Estimated Creatinine Clearance: 68.7 mL/min (by C-G formula based on SCr of 0.91 mg/dL).    Allergies  Allergen Reactions  . Pneumococcal Vaccines Rash     Thank you for allowing pharmacy to be a part of this patient's care.  Lynelle Doctor 05/27/2020 1:13 PM

## 2020-05-27 NOTE — Progress Notes (Signed)
Landis Gandy, MD paged regarding pt's loose stools. Verbal orders for Imodium 2 mg PO q6 PRN.

## 2020-05-28 DIAGNOSIS — R410 Disorientation, unspecified: Secondary | ICD-10-CM

## 2020-05-28 LAB — CBC
HCT: 27.1 % — ABNORMAL LOW (ref 39.0–52.0)
Hemoglobin: 8.6 g/dL — ABNORMAL LOW (ref 13.0–17.0)
MCH: 29 pg (ref 26.0–34.0)
MCHC: 31.7 g/dL (ref 30.0–36.0)
MCV: 91.2 fL (ref 80.0–100.0)
Platelets: 258 10*3/uL (ref 150–400)
RBC: 2.97 MIL/uL — ABNORMAL LOW (ref 4.22–5.81)
RDW: 13.1 % (ref 11.5–15.5)
WBC: 13.7 10*3/uL — ABNORMAL HIGH (ref 4.0–10.5)
nRBC: 0 % (ref 0.0–0.2)

## 2020-05-28 LAB — COMPREHENSIVE METABOLIC PANEL
ALT: 14 U/L (ref 0–44)
AST: 24 U/L (ref 15–41)
Albumin: 2.7 g/dL — ABNORMAL LOW (ref 3.5–5.0)
Alkaline Phosphatase: 61 U/L (ref 38–126)
Anion gap: 8 (ref 5–15)
BUN: 19 mg/dL (ref 8–23)
CO2: 25 mmol/L (ref 22–32)
Calcium: 8.4 mg/dL — ABNORMAL LOW (ref 8.9–10.3)
Chloride: 100 mmol/L (ref 98–111)
Creatinine, Ser: 0.89 mg/dL (ref 0.61–1.24)
GFR calc Af Amer: >60 mL/min (ref >60)
GFR calc non Af Amer: >60 mL/min (ref >60)
Glucose, Bld: 108 mg/dL — ABNORMAL HIGH (ref 70–99)
Potassium: 4.4 mmol/L (ref 3.5–5.1)
Sodium: 133 mmol/L — ABNORMAL LOW (ref 135–145)
Total Bilirubin: 0.6 mg/dL (ref 0.3–1.2)
Total Protein: 5.8 g/dL — ABNORMAL LOW (ref 6.5–8.1)

## 2020-05-28 LAB — RPR: RPR Ser Ql: NONREACTIVE

## 2020-05-28 MED ORDER — SACCHAROMYCES BOULARDII 250 MG PO CAPS
250.0000 mg | ORAL_CAPSULE | Freq: Two times a day (BID) | ORAL | Status: DC
  Administered 2020-05-28 – 2020-05-30 (×5): 250 mg via ORAL
  Filled 2020-05-28 (×5): qty 1

## 2020-05-28 MED ORDER — AMLODIPINE BESYLATE 10 MG PO TABS
10.0000 mg | ORAL_TABLET | Freq: Every day | ORAL | Status: DC
  Administered 2020-05-29 – 2020-05-30 (×2): 10 mg via ORAL
  Filled 2020-05-28 (×2): qty 1

## 2020-05-28 MED ORDER — SERTRALINE HCL 50 MG PO TABS
50.0000 mg | ORAL_TABLET | Freq: Every day | ORAL | Status: DC
  Administered 2020-05-28 – 2020-05-30 (×3): 50 mg via ORAL
  Filled 2020-05-28 (×3): qty 1

## 2020-05-28 NOTE — Plan of Care (Signed)
Care plan reviewed.

## 2020-05-28 NOTE — Progress Notes (Signed)
PROGRESS NOTE    Uziah Sorter  RDE:[id number] DOB: October 23, 1934 DOA: 05/21/2020 PCP: [name], MD    Brief Narrative:Christian Miller. is a 85 y.o.malewith a past medical history of dementia, overactive bladder, essential hypertension, behavioral disturbances secondary to dementia, hypothyroidism who was in his usual state of health till yesterday when he was in the kitchen and he lost his balance and he fell.  Evaluation at [facility] revealed right hip fracture. There was no orthopedic coverage at that facility and thus, the patient was transferred to [facility].  Assessment & Plan:   Principal Problem:   Closed right hip fracture [facility]) Active Problems:   HTN (hypertension)   OAB (overactive bladder)   Dementia without behavioral disturbance ([place])   #1 Right Hip fracture status post right total hip arthroplasty  05/23/2020. insurance refused CIR.  Now looking for SNF. Per wife he still remains confused he is talking things out of his head he was not like that prior to admission.  In the past when he had surgery patient had a confusion which lasted for a week or 2 which got improved on its own.  At this time there is no evidence of active infection noted anywhere.  His white count still remains elevated at 17.5.  I have stopped all the narcotics Zyprexa Wife concerned that he is very confused.    #2 Essential HTN -blood pressure 169 hours 83 Norvasc restarted continue.  May increase the dose as needed.     #3Hypothyroidsm continue Synthroid TSH normal  #4 neuropathy on gabapentin  #5 dementia without behavioral disturbance-dc zyprexa.  Continue Depakote Namenda and Zoloft.  Patient was not on Zyprexa at home.  #6 overactive bladder/bph -continue alfuzosin.  #7 hyponatremia sodium improved to 133.  His serum osmolality is normal.  Zoloft was on hold thinking that could be the culprit for hyponatremia.  However wife tells me he has been taking Zoloft for a  long time and the dose was recently increased 200 mg twice a day.  Will restart Zoloft at a lower dose and follow-up levels of sodium in a.m.  Cortisol level mildly elevated 24 .7.   #8hcap/ leukocytosis -resolving on antibiotics cefepime started for possible HCAP 05/27/2020.  Chest x-ray 05/27/2020 with improvement. #9 AKI resolved creatinine 0.91.   #63  Acute metabolic encephalopathy -altered mental status/intention tremors in the setting of dementia.  CT head shows no acute abnormalities but does show chronic small vessel ischemic changes and atrophy.     Nutrition Problem: Increased nutrient needs Etiology: post-op healing     Signs/Symptoms: estimated needs    Interventions: MVI, Other (Comment) (ProSource Plus; Dillard Essex)  Estimated body mass index is 27.24 kg/m as calculated from the following:   Height as of this encounter: 6' (1.829 m).   Weight as of this encounter: 91.1 kg.  DVT prophylaxis: Lovenox  code Status: Full code Family Communication discussed with patient's wife Disposition Plan:  Status is: Inpatient status post right hip arthroplasty pending snf  Dispo: The patient is from: Home              Anticipated d/c is to: SNF               Anticipated d/c date is: Unknown              Patient currently is not medically stable to be discharged.    Consultants: Ortho Procedures: Right hip arthroplasty 05/23/2020 Antimicrobials: None  Subjective:  Patient resting in bed Somewhat confused this morning Overnight staff reports he did not sleep well   objective: Vitals:   05/27/20 1200 05/27/20 1352 05/27/20 2148 05/28/20 0545  BP: 133/75 139/66 137/80 (!) 169/83  Pulse: 68 73 72 70  Resp:  20  16  Temp:  97.8 F (36.6 C) 98.4 F (36.9 C) 98.3 F (36.8 C)  TempSrc:  Oral Oral Oral  SpO2:  95% 93% 94%  Weight:      Height:        Intake/Output Summary (Last 24 hours) at 05/28/2020 1228 Last data filed at 05/28/2020 0644 Gross per 24 hour  Intake  907.33 ml  Output 1250 ml  Net -342.67 ml   Filed Weights   05/22/20 1100  Weight: 91.1 kg    Examination:  General exam: Appears calm and comfortable oral mucosa dry Respiratory system: Clear to auscultation. Respiratory effort normal. Cardiovascular system: S1 & S2 heard, RRR. No JVD, murmurs, rubs, gallops or clicks. No pedal edema. Gastrointestinal system: Abdomen is nondistended, soft and nontender. No organomegaly or masses felt. Normal bowel sounds heard. Central nervous system: Alert and oriented. No focal neurological deficits. Extremities: Right hip incision covered with dressing skin: No rashes, lesions or ulcers Psychiatry: Judgement and insight appear normal. Mood & affect appropriate.     Data Reviewed: I have personally reviewed following labs and imaging studies  CBC: Recent Labs  Lab 05/24/20 0226 05/25/20 0250 05/26/20 0306 05/27/20 0720 05/28/20 0302  WBC 15.3* 17.3* 17.3* 15.1* 13.7*  HGB 10.0* 8.6* 8.9* 8.5* 8.6*  HCT 31.5* 26.9* 27.7* 26.4* 27.1*  MCV 93.2 92.1 92.0 91.3 91.2  PLT 227 204 216 262 235   Basic Metabolic Panel: Recent Labs  Lab 05/24/20 0226 05/25/20 0250 05/26/20 0306 05/27/20 0245 05/28/20 0302  NA 132* 126* 133* 127* 133*  K 4.6 4.5 4.2 3.8 4.4  CL 99 98 101 97* 100  CO2 23 21* 24 23 25   GLUCOSE 127* 162* 138* 100* 108*  BUN 14 23 19 20 19   CREATININE 1.15 1.28* 0.88 0.91 0.89  CALCIUM 8.2* 7.8* 8.5* 7.6* 8.4*   GFR: Estimated Creatinine Clearance: 70.2 mL/min (by C-G formula based on SCr of 0.89 mg/dL). Liver Function Tests: Recent Labs  Lab 05/25/20 0250 05/26/20 0306 05/27/20 0245 05/28/20 0302  AST 26 22 23 24   ALT 12 11 11 14   ALKPHOS 59 60 55 61  BILITOT 0.4 0.6 0.5 0.6  PROT 5.5* 5.8* 5.6* 5.8*  ALBUMIN 2.7* 2.9* 2.6* 2.7*   No results for input(s): LIPASE, AMYLASE in the last 168 hours. No results for input(s): AMMONIA in the last 168 hours. Coagulation Profile: No results for input(s): INR,  PROTIME in the last 168 hours. Cardiac Enzymes: No results for input(s): CKTOTAL, CKMB, CKMBINDEX, TROPONINI in the last 168 hours. BNP (last 3 results) No results for input(s): PROBNP in the last 8760 hours. HbA1C: No results for input(s): HGBA1C in the last 72 hours. CBG: No results for input(s): GLUCAP in the last 168 hours. Lipid Profile: No results for input(s): CHOL, HDL, LDLCALC, TRIG, CHOLHDL, LDLDIRECT in the last 72 hours. Thyroid Function Tests: No results for input(s): TSH, T4TOTAL, FREET4, T3FREE, THYROIDAB in the last 72 hours. Anemia Panel: Recent Labs    05/27/20 1046 05/27/20 1105  VITAMINB12  --  556  FOLATE 36.8  --    Sepsis Labs: No results for input(s): PROCALCITON, LATICACIDVEN in the last 168 hours.  Recent Results (from the past  240 hour(s))  SARS Coronavirus 2 by RT PCR (hospital order, performed in [facility] hospital lab) Nasopharyngeal Nasal Mucosa     Status: None   Collection Time: 05/22/20  2:00 AM   Specimen: Nasal Mucosa; Nasopharyngeal  Result Value Ref Range Status   SARS Coronavirus 2 NEGATIVE NEGATIVE Final    Comment: (NOTE) SARS-CoV-2 target nucleic acids are NOT DETECTED.  The SARS-CoV-2 RNA is generally detectable in upper and lower respiratory specimens during the acute phase of infection. The lowest concentration of SARS-CoV-2 viral copies this assay can detect is 250 copies / mL. A negative result does not preclude SARS-CoV-2 infection and should not be used as the sole basis for treatment or other patient management decisions.  A negative result may occur with improper specimen collection / handling, submission of specimen other than nasopharyngeal swab, presence of viral mutation(s) within the areas targeted by this assay, and inadequate number of viral copies (<250 copies / mL). A negative result must be combined with clinical observations, patient history, and epidemiological information.  Fact Sheet for Patients:     StrictlyIdeas.no  Fact Sheet for Healthcare Providers: BankingDealers.co.za  This test is not yet approved or  cleared by the [place] FDA and has been authorized for detection and/or diagnosis of SARS-CoV-2 by FDA under an Emergency Use Authorization (EUA).  This EUA will remain in effect (meaning this test can be used) for the duration of the COVID-19 declaration under Section 564(b)(1) of the Act, 21 U.S.C. section 360bbb-3(b)(1), unless the authorization is terminated or revoked sooner.  Performed at [facility], [place] [address] [place], [place] [address]   MRSA PCR Screening     Status: None   Collection Time: 05/23/20 12:31 AM   Specimen: Nasal Mucosa; Nasopharyngeal  Result Value Ref Range Status   MRSA by PCR NEGATIVE NEGATIVE Final    Comment:        The GeneXpert MRSA Assay (FDA approved for NASAL specimens only), is one component of a comprehensive MRSA colonization surveillance program. It is not intended to diagnose MRSA infection nor to guide or monitor treatment for MRSA infections. Performed at [facility], [place] [address], [place], [place] [address]   Culture, blood (routine x 2)     Status: None (Preliminary result)   Collection Time: 05/25/20 10:35 AM   Specimen: BLOOD  Result Value Ref Range Status   Specimen Description   Final    BLOOD LEFT ANTECUBITAL Performed at [facility], [place] [address] [place], [place] [address]    Special Requests   Final    BOTTLES DRAWN AEROBIC AND ANAEROBIC Blood Culture adequate volume Performed at [place] [address], [place], [place] [address]    Culture   Final    NO GROWTH 3 DAYS Performed at [facility], [place] [address], [place], [place] [address]    Report Status PENDING  Incomplete  Culture, blood (routine x 2)     Status: None (Preliminary result)   Collection Time: 05/25/20  10:44 AM   Specimen: BLOOD LEFT ARM  Result Value Ref Range Status   Specimen Description   Final    BLOOD LEFT ARM Performed at [place] [address] [place], [place] [address]    Special Requests   Final    BOTTLES DRAWN AEROBIC AND ANAEROBIC Blood Culture adequate volume Performed at [place] [address] [place], [place] [address]  Culture   Final    NO GROWTH 3 DAYS Performed at [facility], [place] [address] [place], [place] [address]    Report Status PENDING  Incomplete         Radiology Studies: DG Chest 1 View  Result Date: 05/27/2020 CLINICAL DATA:  Cough. EXAM: CHEST  1 VIEW COMPARISON:  Chest radiograph 05/25/2020. FINDINGS: Stable cardiac and mediastinal contours. Tortuosity of the thoracic aorta. Slight interval improvement left basilar opacities. Small left pleural effusion. No pneumothorax. Thoracic spine degenerative changes. IMPRESSION: Slight interval decrease opacities left lower hemithorax. Small left effusion. Electronically Signed   By: Lovey Newcomer M.D.   On: 05/27/2020 15:31   CT HEAD WO CONTRAST  Result Date: 05/27/2020 CLINICAL DATA:  Altered mental status. EXAM: CT HEAD WITHOUT CONTRAST TECHNIQUE: Contiguous axial images were obtained from the base of the skull through the vertex without intravenous contrast. COMPARISON:  06/03/2019 FINDINGS: Brain: Ventricles, cisterns and other CSF spaces are within normal. There is mild chronic ischemic microvascular disease. Minimal age related atrophic change. No mass, mass effect, shift of midline structures or acute hemorrhage. No acute infarction. Vascular: No hyperdense vessel or unexpected calcification. Skull: Normal. Negative for fracture or focal lesion. Sinuses/Orbits: No acute finding. Other: None. IMPRESSION: 1. No acute findings. 2. Mild chronic ischemic microvascular disease and age related atrophic change. Electronically Signed   By: Marin Olp  M.D.   On: 05/27/2020 11:40        Scheduled Meds: . (feeding supplement) PROSource Plus  30 mL Oral Daily  . acetaminophen  650 mg Oral TID  . alfuzosin  10 mg Oral BID  . amLODipine  5 mg Oral Daily  . divalproex  125 mg Oral QHS  . donepezil  5 mg Oral Daily  . enoxaparin (LOVENOX) injection  40 mg Subcutaneous Q24H  . feeding supplement (KATE FARMS STANDARD 1.4)  325 mL Oral Daily  . ferrous sulfate  325 mg Oral TID PC  . folic acid  425 mcg Oral QHS  . gabapentin  300 mg Oral TID  . levothyroxine  75 mcg Oral QAC breakfast  . memantine  5 mg Oral BID  . multivitamin with minerals  1 tablet Oral Daily  . pantoprazole  40 mg Oral Daily   Continuous Infusions: . sodium chloride 250 mL (05/28/20 1153)  . ceFEPime (MAXIPIME) IV 2 g (05/28/20 9563)      Georgette Shell, MD  05/28/2020, 12:28 PM

## 2020-05-29 ENCOUNTER — Inpatient Hospital Stay (HOSPITAL_COMMUNITY): Payer: PPO

## 2020-05-29 DIAGNOSIS — L8990 Pressure ulcer of unspecified site, unspecified stage: Secondary | ICD-10-CM | POA: Insufficient documentation

## 2020-05-29 LAB — COMPREHENSIVE METABOLIC PANEL
ALT: 17 U/L (ref 0–44)
AST: 27 U/L (ref 15–41)
Albumin: 2.8 g/dL — ABNORMAL LOW (ref 3.5–5.0)
Alkaline Phosphatase: 57 U/L (ref 38–126)
Anion gap: 10 (ref 5–15)
BUN: 18 mg/dL (ref 8–23)
CO2: 25 mmol/L (ref 22–32)
Calcium: 8.4 mg/dL — ABNORMAL LOW (ref 8.9–10.3)
Chloride: 98 mmol/L (ref 98–111)
Creatinine, Ser: 0.88 mg/dL (ref 0.61–1.24)
GFR calc Af Amer: >60 mL/min (ref >60)
GFR calc non Af Amer: >60 mL/min (ref >60)
Glucose, Bld: 134 mg/dL — ABNORMAL HIGH (ref 70–99)
Potassium: 3.9 mmol/L (ref 3.5–5.1)
Sodium: 133 mmol/L — ABNORMAL LOW (ref 135–145)
Total Bilirubin: 0.6 mg/dL (ref 0.3–1.2)
Total Protein: 5.6 g/dL — ABNORMAL LOW (ref 6.5–8.1)

## 2020-05-29 LAB — CBC
HCT: 26.4 % — ABNORMAL LOW (ref 39.0–52.0)
Hemoglobin: 8.6 g/dL — ABNORMAL LOW (ref 13.0–17.0)
MCH: 29.7 pg (ref 26.0–34.0)
MCHC: 32.6 g/dL (ref 30.0–36.0)
MCV: 91 fL (ref 80.0–100.0)
Platelets: 294 10*3/uL (ref 150–400)
RBC: 2.9 MIL/uL — ABNORMAL LOW (ref 4.22–5.81)
RDW: 12.9 % (ref 11.5–15.5)
WBC: 12.6 10*3/uL — ABNORMAL HIGH (ref 4.0–10.5)
nRBC: 0 % (ref 0.0–0.2)

## 2020-05-29 LAB — SARS CORONAVIRUS 2 BY RT PCR (HOSPITAL ORDER, PERFORMED IN ~~LOC~~ HOSPITAL LAB): SARS Coronavirus 2: NEGATIVE

## 2020-05-29 MED ORDER — ENOXAPARIN SODIUM 40 MG/0.4ML ~~LOC~~ SOLN
40.0000 mg | SUBCUTANEOUS | 0 refills | Status: DC
Start: 2020-05-29 — End: 2021-04-03

## 2020-05-29 MED ORDER — ONDANSETRON HCL 4 MG PO TABS: 4.0000 mg | ORAL_TABLET | Freq: Four times a day (QID) | ORAL | 0 refills | Status: DC | PRN

## 2020-05-29 MED ORDER — FERROUS SULFATE 325 (65 FE) MG PO TABS: 325.0000 mg | ORAL_TABLET | Freq: Three times a day (TID) | ORAL | 3 refills | Status: AC

## 2020-05-29 MED ORDER — SACCHAROMYCES BOULARDII 250 MG PO CAPS: 250.0000 mg | ORAL_CAPSULE | Freq: Two times a day (BID) | ORAL | 0 refills | Status: DC

## 2020-05-29 MED ORDER — GABAPENTIN 300 MG PO CAPS
600.0000 mg | ORAL_CAPSULE | Freq: Three times a day (TID) | ORAL | Status: DC
  Administered 2020-05-29 – 2020-05-30 (×4): 600 mg via ORAL
  Filled 2020-05-29 (×4): qty 2

## 2020-05-29 MED ORDER — AMOXICILLIN-POT CLAVULANATE 875-125 MG PO TABS: 1.0000 | ORAL_TABLET | Freq: Two times a day (BID) | ORAL | 0 refills | Status: AC

## 2020-05-29 MED ORDER — ACETAMINOPHEN 325 MG PO TABS: 650.0000 mg | ORAL_TABLET | Freq: Three times a day (TID) | ORAL | Status: AC

## 2020-05-29 NOTE — Progress Notes (Signed)
Physical Therapy Treatment Patient Details Name: Christian PEYTON Sr. MRN: [id number] DOB: 1934/11/21 Today's Date: 05/29/2020    History of Present Illness 85 y.o. male with a past medical history of dementia, overactive bladder, essential hypertension, behavioral disturbances secondary to dementia, hypothyroidism and admitted with displaced right femoral neck fracture and s/p R THA with posterior approach 05/23/20    PT Comments    Pt easily arouse ans smiling.  AxO x 2 following functional commands.  Spouse in room also assisted.  General bed mobility comments: Assist for trunk and LEs. Utilized bedpad to aid with scooting, positioning. Multimodal cueing required. General transfer comment: + 2 side by side assist with 75% VC's on proper hand placement, proper hand transfer and tactile cueing for upright posture.  Shaky, rigid, unsteady.  General Gait Details: Used EVA walker this session to advance gait.  Required + 2 side by side assist and spouse following with recliner.  75% VC's on stepping and 50% assist to advance walker slowly as pt took short, small, shuffled steps. Heavy lean on walker. Pt was able to progress gait.  Pt will need ST Rehab at SNF prior to return home.   Follow Up Recommendations  SNF     Equipment Recommendations       Recommendations for Other Services       Precautions / Restrictions Precautions Precautions: Posterior Hip;Fall Precaution Comments: reviewed posterior hip precautions pt unable to recall any    Mobility  Bed Mobility Overal bed mobility: Needs Assistance Bed Mobility: Supine to Sit     Supine to sit: Mod assist;+2 for physical assistance;+2 for safety/equipment;HOB elevated     General bed mobility comments: Assist for trunk and LEs. Utilized bedpad to aid with scooting, positioning. Multimodal cueing required.  Transfers Overall transfer level: Needs assistance Equipment used: Bilateral platform walker (EVA walker) Transfers: Sit  to/from Stand Sit to Stand: Max assist;+2 physical assistance         General transfer comment: + 2 side by side assist with 75% VC's on proper hand placement, proper hand transfer and tactile cueing for upright posture.  Shaky, rigid, unsteady  Ambulation/Gait Ambulation/Gait assistance: Mod assist;+2 physical assistance;+2 safety/equipment Gait Distance (Feet): 28 Feet Assistive device: Bilateral platform walker (EVA walker) Gait Pattern/deviations: Step-to pattern;Antalgic;Narrow base of support;Decreased stance time - right Gait velocity: decreased   General Gait Details: Used EVA walker this session to advance gait.  Required + 2 side by side assist and spouse following with recliner.  75% VC's on stepping and 50% assist to advance walker slowly as pt took short, small, shuffled steps. Heavy lean on walker.   Stairs             Wheelchair Mobility    Modified Rankin (Stroke Patients Only)       Balance                                            Cognition Arousal/Alertness: Awake/alert Behavior During Therapy: WFL for tasks assessed/performed Overall Cognitive Status: History of cognitive impairments - at baseline Area of Impairment: Following commands;Problem solving                       Following Commands: Follows one step commands with increased time     Problem Solving: Slow processing;Difficulty sequencing;Requires verbal cues;Requires tactile cues General Comments: hx of dementia,  pleasant, following repeat functional commands      Exercises      General Comments        Pertinent Vitals/Pain Pain Assessment: Faces Faces Pain Scale: Hurts little more Pain Location: right hip Pain Descriptors / Indicators: Aching;Discomfort;Sore;Grimacing;Guarding Pain Intervention(s): Monitored during session;Premedicated before session;Repositioned;Ice applied    Home Living                      Prior Function             PT Goals (current goals can now be found in the care plan section) Progress towards PT goals: Progressing toward goals    Frequency    Min 3X/week      PT Plan Frequency needs to be updated;Discharge plan needs to be updated    Co-evaluation              AM-PAC PT "6 Clicks" Mobility   Outcome Measure  Help needed turning from your back to your side while in a flat bed without using bedrails?: A Lot Help needed moving from lying on your back to sitting on the side of a flat bed without using bedrails?: A Lot Help needed moving to and from a bed to a chair (including a wheelchair)?: A Lot Help needed standing up from a chair using your arms (e.g., wheelchair or bedside chair)?: A Lot Help needed to walk in hospital room?: A Lot Help needed climbing 3-5 steps with a railing? : Total 6 Click Score: 11    End of Session Equipment Utilized During Treatment: Gait belt Activity Tolerance: Patient tolerated treatment well Patient left: in chair;with chair alarm set;with call bell/phone within reach Nurse Communication: Mobility status PT Visit Diagnosis: History of falling (Z91.81);Muscle weakness (generalized) (M62.81);Other abnormalities of gait and mobility (R26.89);Pain Pain - Right/Left: Right Pain - part of body: Hip     Time: 1025-1050 PT Time Calculation (min) (ACUTE ONLY): 25 min  Charges:  $Gait Training: 8-22 mins $Therapeutic Exercise: 8-22 mins                     [name]  PTA Acute  Rehabilitation Services Pager      [phone] Office      [phone]

## 2020-05-29 NOTE — Progress Notes (Signed)
PROGRESS NOTE    Christian Miller  QPR:[id number] DOB: Oct 18, 1934 DOA: 05/21/2020 PCP: [name], MD    Brief Narrative:Christian Amada Jupiter. is a 85 y.o.malewith a past medical history of dementia, overactive bladder, essential hypertension, behavioral disturbances secondary to dementia, hypothyroidism who was in his usual state of health till yesterday when he was in the kitchen and he lost his balance and he fell.  Evaluation at [facility] revealed right hip fracture. There was no orthopedic coverage at that facility and thus, the patient was transferred to [facility].  Assessment & Plan:   Principal Problem:   Closed right hip fracture ([place]) Active Problems:   HTN (hypertension)   OAB (overactive bladder)   Dementia without behavioral disturbance (HCC)   Confusion   Pressure injury of skin   #1 Right Hip fracture status post right total hip arthroplasty  05/23/2020. insurance refused CIR.  Now looking for SNF.  Per wife he is still not back to his baseline. Per wife he still remains confused he is talking things out of his head he was not like that prior to admission.  In the past when he had surgery patient had a confusion which lasted for a week or 2 which got improved on its own.  At this time there is no evidence of active infection noted anywhere.  His white count still remains elevated at 17.5.  I have stopped all the narcotics Zyprexa Wife concerned that he is very confused.    #2 Essential HTN -blood pressure 139/55 on Norvasc.    #3Hypothyroidsm continue Synthroid TSH normal  #4 neuropathy on gabapentin  #5 dementia without behavioral disturbance-dc zyprexa.  Continue Depakote Namenda and Zoloft.  Patient was not on Zyprexa at home.  #6 overactive bladder/bph -continue alfuzosin.  #7 hyponatremia sodium improved to 133.  His serum osmolality is normal.  Zoloft was on hold thinking that could be the culprit for hyponatremia.  However wife tells me he  has been taking Zoloft for a long time and the dose was recently increased 200 mg twice a day.  Will restart Zoloft at a lower dose and follow-up levels of sodium in a.m.  Cortisol level mildly elevated 24 .7.  Edema stable at 133.   #8hcap/ leukocytosis -resolving on antibiotics cefepime started for possible HCAP 05/27/2020.  Chest x-ray 05/27/2020 with improvement.  #9 AKI resolved creatinine 0.91.   #99  Acute metabolic encephalopathy -altered mental status/intention tremors in the setting of dementia.  CT head shows no acute abnormalities but does show chronic small vessel ischemic changes and atrophy.     Nutrition Problem: Increased nutrient needs Etiology: post-op healing     Signs/Symptoms: estimated needs    Interventions: MVI, Other (Comment) (ProSource Plus; [employer] [place])  Estimated body mass index is 28.7 kg/m as calculated from the following:   Height as of this encounter: 6' (1.829 m).   Weight as of this encounter: 96 kg.  DVT prophylaxis: Lovenox  code Status: Full code Family Communication discussed with patient's wife Disposition Plan:  Status is: Inpatient status post right hip arthroplasty pending snf  Dispo: The patient is from: Home              Anticipated d/c is to: SNF               Anticipated d/c date is: Unknown              Patient currently is not medically stable to  be discharged.    Consultants: Ortho Procedures: Right hip arthroplasty 05/23/2020 Antimicrobials: None  Subjective:  Patient resting in bed Night staff reported he is still with some confusion though he is oriented to place   objective: Vitals:   05/28/20 2132 05/29/20 0544 05/29/20 1200 05/29/20 1345  BP: (!) 162/72 (!) 153/80  (!) 139/55  Pulse: 67 63  (!) 55  Resp: 16 14  16   Temp: 98.5 F (36.9 C) 98.3 F (36.8 C)  98.2 F (36.8 C)  TempSrc: Oral Oral  Oral  SpO2: 96% 97%  97%  Weight:   96 kg   Height:        Intake/Output Summary (Last 24 hours) at 05/29/2020  1528 Last data filed at 05/29/2020 0544 Gross per 24 hour  Intake 804.17 ml  Output 550 ml  Net 254.17 ml   Filed Weights   05/22/20 1100 05/29/20 1200  Weight: 91.1 kg 96 kg    Examination:  General exam: Appears calm and comfortable oral mucosa dry Respiratory system: Clear to auscultation. Respiratory effort normal. Cardiovascular system: S1 & S2 heard, RRR. No JVD, murmurs, rubs, gallops or clicks. No pedal edema. Gastrointestinal system: Abdomen is nondistended, soft and nontender. No organomegaly or masses felt. Normal bowel sounds heard. Central nervous system: Alert and oriented. No focal neurological deficits. Extremities: Right hip incision covered with dressing skin: No rashes, lesions or ulcers Psychiatry: Judgement and insight appear normal. Mood & affect appropriate.     Data Reviewed: I have personally reviewed following labs and imaging studies  CBC: Recent Labs  Lab 05/25/20 0250 05/26/20 0306 05/27/20 0720 05/28/20 0302 05/29/20 0324  WBC 17.3* 17.3* 15.1* 13.7* 12.6*  HGB 8.6* 8.9* 8.5* 8.6* 8.6*  HCT 26.9* 27.7* 26.4* 27.1* 26.4*  MCV 92.1 92.0 91.3 91.2 91.0  PLT 204 216 262 258 417   Basic Metabolic Panel: Recent Labs  Lab 05/25/20 0250 05/26/20 0306 05/27/20 0245 05/28/20 0302 05/29/20 0324  NA 126* 133* 127* 133* 133*  K 4.5 4.2 3.8 4.4 3.9  CL 98 101 97* 100 98  CO2 21* 24 23 25 25   GLUCOSE 162* 138* 100* 108* 134*  BUN 23 19 20 19 18   CREATININE 1.28* 0.88 0.91 0.89 0.88  CALCIUM 7.8* 8.5* 7.6* 8.4* 8.4*   GFR: Estimated Creatinine Clearance: 77.8 mL/min (by C-G formula based on SCr of 0.88 mg/dL). Liver Function Tests: Recent Labs  Lab 05/25/20 0250 05/26/20 0306 05/27/20 0245 05/28/20 0302 05/29/20 0324  AST 26 22 23 24 27   ALT 12 11 11 14 17   ALKPHOS 59 60 55 61 57  BILITOT 0.4 0.6 0.5 0.6 0.6  PROT 5.5* 5.8* 5.6* 5.8* 5.6*  ALBUMIN 2.7* 2.9* 2.6* 2.7* 2.8*   No results for input(s): LIPASE, AMYLASE in the last 168  hours. No results for input(s): AMMONIA in the last 168 hours. Coagulation Profile: No results for input(s): INR, PROTIME in the last 168 hours. Cardiac Enzymes: No results for input(s): CKTOTAL, CKMB, CKMBINDEX, TROPONINI in the last 168 hours. BNP (last 3 results) No results for input(s): PROBNP in the last 8760 hours. HbA1C: No results for input(s): HGBA1C in the last 72 hours. CBG: No results for input(s): GLUCAP in the last 168 hours. Lipid Profile: No results for input(s): CHOL, HDL, LDLCALC, TRIG, CHOLHDL, LDLDIRECT in the last 72 hours. Thyroid Function Tests: No results for input(s): TSH, T4TOTAL, FREET4, T3FREE, THYROIDAB in the last 72 hours. Anemia Panel: Recent Labs    05/27/20 1046 05/27/20  [place] 36.8  --    Sepsis Labs: No results for input(s): PROCALCITON, LATICACIDVEN in the last 168 hours.  Recent Results (from the past 240 hour(s))  SARS Coronavirus 2 by RT PCR (hospital order, performed in [facility] hospital lab) Nasopharyngeal Nasal Mucosa     Status: None   Collection Time: 05/22/20  2:00 AM   Specimen: Nasal Mucosa; Nasopharyngeal  Result Value Ref Range Status   SARS Coronavirus 2 NEGATIVE NEGATIVE Final    Comment: (NOTE) SARS-CoV-2 target nucleic acids are NOT DETECTED.  The SARS-CoV-2 RNA is generally detectable in upper and lower respiratory specimens during the acute phase of infection. The lowest concentration of SARS-CoV-2 viral copies this assay can detect is 250 copies / mL. A negative result does not preclude SARS-CoV-2 infection and should not be used as the sole basis for treatment or other patient management decisions.  A negative result may occur with improper specimen collection / handling, submission of specimen other than nasopharyngeal swab, presence of viral mutation(s) within the areas targeted by this assay, and inadequate number of viral copies (<250 copies / mL). A negative result must be  combined with clinical observations, patient history, and epidemiological information.  Fact Sheet for Patients:   StrictlyIdeas.no  Fact Sheet for Healthcare Providers: BankingDealers.co.za  This test is not yet approved or  cleared by the [place] FDA and has been authorized for detection and/or diagnosis of SARS-CoV-2 by FDA under an Emergency Use Authorization (EUA).  This EUA will remain in effect (meaning this test can be used) for the duration of the COVID-19 declaration under Section 564(b)(1) of the Act, 21 U.S.C. section 360bbb-3(b)(1), unless the authorization is terminated or revoked sooner.  Performed at [facility], [place] [address], [place], [place] [address]   MRSA PCR Screening     Status: None   Collection Time: 05/23/20 12:31 AM   Specimen: Nasal Mucosa; Nasopharyngeal  Result Value Ref Range Status   MRSA by PCR NEGATIVE NEGATIVE Final    Comment:        The GeneXpert MRSA Assay (FDA approved for NASAL specimens only), is one component of a comprehensive MRSA colonization surveillance program. It is not intended to diagnose MRSA infection nor to guide or monitor treatment for MRSA infections. Performed at [facility], [place] [address], [place], [place] [address]   Culture, blood (routine x 2)     Status: None (Preliminary result)   Collection Time: 05/25/20 10:35 AM   Specimen: BLOOD  Result Value Ref Range Status   Specimen Description   Final    BLOOD LEFT ANTECUBITAL Performed at [facility], [place] [address] [place], [place] [address]    Special Requests   Final    BOTTLES DRAWN AEROBIC AND ANAEROBIC Blood Culture adequate volume Performed at [place] [address] [place], [place] [address]    Culture   Final    NO GROWTH 4 DAYS Performed at [facility], [place] [address], [place], [place] [address]    Report Status  PENDING  Incomplete  Culture, blood (routine x 2)     Status: None (Preliminary result)   Collection Time: 05/25/20 10:44 AM   Specimen: BLOOD LEFT ARM  Result Value Ref Range Status   Specimen Description   Final    BLOOD LEFT ARM Performed at [facility] [address] [place], [place] [address]  Special Requests   Final    BOTTLES DRAWN AEROBIC AND ANAEROBIC Blood Culture adequate volume Performed at [place] [place] [address], [place], [place] [address]    Culture   Final    NO GROWTH 4 DAYS Performed at [facility], [place] [address], [place], [place] [address]    Report Status PENDING  Incomplete         Radiology Studies: No results found.      Scheduled Meds: . (feeding supplement) PROSource Plus  30 mL Oral Daily  . acetaminophen  650 mg Oral TID  . alfuzosin  10 mg Oral BID  . amLODipine  10 mg Oral Daily  . divalproex  125 mg Oral QHS  . donepezil  5 mg Oral Daily  . enoxaparin (LOVENOX) injection  40 mg Subcutaneous Q24H  . feeding supplement ([employer] STANDARD 1.4)  325 mL Oral Daily  . ferrous sulfate  325 mg Oral TID PC  . folic acid  747 mcg Oral QHS  . gabapentin  300 mg Oral TID  . levothyroxine  75 mcg Oral QAC breakfast  . memantine  5 mg Oral BID  . multivitamin with minerals  1 tablet Oral Daily  . pantoprazole  40 mg Oral Daily  . saccharomyces boulardii  250 mg Oral BID  . sertraline  50 mg Oral Daily   Continuous Infusions: . sodium chloride 250 mL (05/28/20 1153)  . ceFEPime (MAXIPIME) IV 2 g (05/29/20 1345)      Georgette Shell, MD  05/29/2020, 3:28 PM

## 2020-05-29 NOTE — Care Management Important Message (Signed)
Important Message  Patient Details IM Letter given to the Patient Name: Christian RAPAPORT Sr. MRN: [id number] Date of Birth: Aug 13, 1935   Medicare Important Message Given:  Yes     [name] 05/29/2020, 1:25 PM

## 2020-05-29 NOTE — TOC Progression Note (Signed)
Transition of Care (TOC) - Progression Note    Patient Details  Name: Christian CRIADO Sr. MRN: [id number] Date of Birth: [birth year]  Transition of Care [facility]) CM/SW Contact  Lennart Pall, LCSW Phone Number: 05/29/2020, 3:59 PM  Clinical Narrative:    Have received SNF bed offers and pt/[facility]  with plan to transfer pt tomorrow via [facility].  MD and team aware and will need another COVID test.     Expected Discharge Plan: Skilled Nursing Facility Barriers to Discharge: Other (comment) (family not wanting to accept bed offers and reaching out to other facilities)  Expected Discharge Plan and Services Expected Discharge Plan: [place] arrangements for the past 2 months: Single Family Home                                       Social Determinants of Health (SDOH) Interventions    Readmission Risk Interventions No flowsheet data found.

## 2020-05-29 NOTE — Progress Notes (Signed)
Nutrition Follow-up  DOCUMENTATION CODES:   Not applicable  INTERVENTION:  - continue [employer] 1.4 po once/day and 30 ml Prosource Plus once/day.  NUTRITION DIAGNOSIS:   Increased nutrient needs related to post-op healing as evidenced by estimated needs. -ongoing  GOAL:   Patient will meet greater than or equal to 90% of their needs -minimally met on average  MONITOR:   PO intake, Supplement acceptance, Labs, Weight trends  ASSESSMENT:   85 y.o. male who presented to the ED with acute severe R hip pain after mechanical fall at home.  Pain is worse with movement, better with rest.  He normally walks with a walker at nighttime and with a cane during the daytime. He had a previous L hip fracture with total hip replacement in 1998.  She has been accepting [employer] and Prosource Plus 100% of the time offered. Patient has mainly been eating 50-75% of meals over the past 1 week. She is POD #6 total R hip arthroplasty.   She has not been weighed since admission on 8/9. Flow sheet indicates mild edema to RLE.     Labs reviewed; Na: 133 mmol/l, Ca: 8.4 mg/dl. Medications reviewed; 325 mg ferrous sulfate TID, 0.5 mg folvite/day, 75 mcg oral synthroid/day, 1 tablet multivitamin with minerals/day, 40 mg oral protonix/day, 250 mg florastor BID.    Diet Order:   Diet Order            Diet regular Room service appropriate? Yes; Fluid consistency: Thin  Diet effective now                 EDUCATION NEEDS:   No education needs have been identified at this time  Skin:  Skin Assessment: Skin Integrity Issues: Skin Integrity Issues:: Stage I, Incisions Stage I: buttocks (new 8/13) Incisions: R hip (8/10)  Last BM:  8/16 (type 6)  Height:   Ht Readings from Last 1 Encounters:  05/22/20 6' (1.829 m)    Weight:   Wt Readings from Last 1 Encounters:  05/22/20 91.1 kg     Estimated Nutritional Needs:  Kcal:  2100-2300 kcal Protein:  105-115 grams Fluid:  >/= 2.2  L/day     Jarome Matin, MS, RD, LDN, CNSC Inpatient Clinical Dietitian RD pager # available in [place]  After hours/weekend pager # available in [facility]

## 2020-05-29 NOTE — Progress Notes (Addendum)
Subjective: 6 Days Post-Op s/p Procedure(s): TOTAL HIP ARTHROPLASTY  Pleasantly confused. Complains of moderate right hip pain on exam. No other complaints.  Objective:  PE: VITALS:   Vitals:   05/27/20 2148 05/28/20 0545 05/28/20 2132 05/29/20 0544  BP: 137/80 (!) 169/83 (!) 162/72 (!) 153/80  Pulse: 72 70 67 63  Resp:  16 16 14   Temp: 98.4 F (36.9 C) 98.3 F (36.8 C) 98.5 F (36.9 C) 98.3 F (36.8 C)  TempSrc: Oral Oral Oral Oral  SpO2: 93% 94% 96% 97%  Weight:      Height:       General: Oriented to person and place, in no acute distress ABD soft Neurovascular intact Sensation intact distally Intact pulses distally Dorsiflexion/Plantar flexion intact Incision: scant drainage - serous drainage. Incision looks to be healing well with no signs of infection.  LABS  Results for orders placed or performed during the hospital encounter of 05/21/20 (from the past 24 hour(s))  Comprehensive metabolic panel     Status: Abnormal   Collection Time: 05/29/20  3:24 AM  Result Value Ref Range   Sodium 133 (L) 135 - 145 mmol/L   Potassium 3.9 3.5 - 5.1 mmol/L   Chloride 98 98 - 111 mmol/L   CO2 25 22 - 32 mmol/L   Glucose, Bld 134 (H) 70 - 99 mg/dL   BUN 18 8 - 23 mg/dL   Creatinine, Ser 0.88 0.61 - 1.24 mg/dL   Calcium 8.4 (L) 8.9 - 10.3 mg/dL   Total Protein 5.6 (L) 6.5 - 8.1 g/dL   Albumin 2.8 (L) 3.5 - 5.0 g/dL   AST 27 15 - 41 U/L   ALT 17 0 - 44 U/L   Alkaline Phosphatase 57 38 - 126 U/L   Total Bilirubin 0.6 0.3 - 1.2 mg/dL   GFR calc non Af Amer >60 >60 mL/min   GFR calc Af Amer >60 >60 mL/min   Anion gap 10 5 - 15  CBC     Status: Abnormal   Collection Time: 05/29/20  3:24 AM  Result Value Ref Range   WBC 12.6 (H) 4.0 - 10.5 K/uL   RBC 2.90 (L) 4.22 - 5.81 MIL/uL   Hemoglobin 8.6 (L) 13.0 - 17.0 g/dL   HCT 26.4 (L) 39 - 52 %   MCV 91.0 80.0 - 100.0 fL   MCH 29.7 26.0 - 34.0 pg   MCHC 32.6 30.0 - 36.0 g/dL   RDW 12.9 11.5 - 15.5 %   Platelets 294  150 - 400 K/uL   nRBC 0.0 0.0 - 0.2 %    DG Chest 1 View  Result Date: 05/27/2020 CLINICAL DATA:  Cough. EXAM: CHEST  1 VIEW COMPARISON:  Chest radiograph 05/25/2020. FINDINGS: Stable cardiac and mediastinal contours. Tortuosity of the thoracic aorta. Slight interval improvement left basilar opacities. Small left pleural effusion. No pneumothorax. Thoracic spine degenerative changes. IMPRESSION: Slight interval decrease opacities left lower hemithorax. Small left effusion. Electronically Signed   By: Lovey Newcomer M.D.   On: 05/27/2020 15:31   CT HEAD WO CONTRAST  Result Date: 05/27/2020 CLINICAL DATA:  Altered mental status. EXAM: CT HEAD WITHOUT CONTRAST TECHNIQUE: Contiguous axial images were obtained from the base of the skull through the vertex without intravenous contrast. COMPARISON:  06/03/2019 FINDINGS: Brain: Ventricles, cisterns and other CSF spaces are within normal. There is mild chronic ischemic microvascular disease. Minimal age related atrophic change. No mass, mass effect, shift of midline structures or acute hemorrhage.  No acute infarction. Vascular: No hyperdense vessel or unexpected calcification. Skull: Normal. Negative for fracture or focal lesion. Sinuses/Orbits: No acute finding. Other: None. IMPRESSION: 1. No acute findings. 2. Mild chronic ischemic microvascular disease and age related atrophic change. Electronically Signed   By: Marin Olp M.D.   On: 05/27/2020 11:40    Assessment/Plan: Principal Problem:   Closed right hip fracture (HCC) Active Problems:   HTN (hypertension)   OAB (overactive bladder)   Dementia without behavioral disturbance (HCC)   Confusion   Closed right hip fracture 6 Days Post-Op s/p Procedure(s): TOTAL HIP ARTHROPLASTY - remains confused - no signs of infection at this time at operative site at this time - Hbg stable at 8.6 - sacral pressure wound - continue turning patient in bed, up with therapy, wound care  Weightbearing: WBAT RLE,  up with therapy Insicional and dressing care: Reinforce dressings as needed, dressing changed this AM Orthopedic device(s): None VTE prophylaxis: lovenox x 30 days Pain control: tylenol Follow - up plan: 2 weeks with Dr. Mardelle Matte Dispo: pending SNF placement   Contact information:   Weekdays 8-5 Merlene Pulling, PA-C [phone] A fter hours and holidays please check [email or web address] for group call information for [facility]  Ventura Bruns 05/29/2020, 8:11 AM

## 2020-05-30 DIAGNOSIS — G2401 Drug induced subacute dyskinesia: Secondary | ICD-10-CM | POA: Diagnosis not present

## 2020-05-30 DIAGNOSIS — E039 Hypothyroidism, unspecified: Secondary | ICD-10-CM | POA: Diagnosis not present

## 2020-05-30 DIAGNOSIS — Z96641 Presence of right artificial hip joint: Secondary | ICD-10-CM | POA: Diagnosis not present

## 2020-05-30 DIAGNOSIS — N3281 Overactive bladder: Secondary | ICD-10-CM | POA: Diagnosis not present

## 2020-05-30 DIAGNOSIS — K219 Gastro-esophageal reflux disease without esophagitis: Secondary | ICD-10-CM | POA: Diagnosis not present

## 2020-05-30 DIAGNOSIS — R52 Pain, unspecified: Secondary | ICD-10-CM | POA: Diagnosis not present

## 2020-05-30 DIAGNOSIS — N320 Bladder-neck obstruction: Secondary | ICD-10-CM | POA: Diagnosis not present

## 2020-05-30 DIAGNOSIS — D649 Anemia, unspecified: Secondary | ICD-10-CM | POA: Diagnosis not present

## 2020-05-30 DIAGNOSIS — R262 Difficulty in walking, not elsewhere classified: Secondary | ICD-10-CM | POA: Diagnosis not present

## 2020-05-30 DIAGNOSIS — M6281 Muscle weakness (generalized): Secondary | ICD-10-CM | POA: Diagnosis not present

## 2020-05-30 DIAGNOSIS — I469 Cardiac arrest, cause unspecified: Secondary | ICD-10-CM | POA: Diagnosis not present

## 2020-05-30 DIAGNOSIS — F0390 Unspecified dementia without behavioral disturbance: Secondary | ICD-10-CM | POA: Diagnosis not present

## 2020-05-30 DIAGNOSIS — E785 Hyperlipidemia, unspecified: Secondary | ICD-10-CM | POA: Diagnosis not present

## 2020-05-30 DIAGNOSIS — Z7401 Bed confinement status: Secondary | ICD-10-CM | POA: Diagnosis not present

## 2020-05-30 DIAGNOSIS — G609 Hereditary and idiopathic neuropathy, unspecified: Secondary | ICD-10-CM | POA: Diagnosis not present

## 2020-05-30 DIAGNOSIS — F339 Major depressive disorder, recurrent, unspecified: Secondary | ICD-10-CM | POA: Diagnosis not present

## 2020-05-30 DIAGNOSIS — L89150 Pressure ulcer of sacral region, unstageable: Secondary | ICD-10-CM | POA: Diagnosis not present

## 2020-05-30 DIAGNOSIS — F329 Major depressive disorder, single episode, unspecified: Secondary | ICD-10-CM | POA: Diagnosis not present

## 2020-05-30 DIAGNOSIS — F319 Bipolar disorder, unspecified: Secondary | ICD-10-CM | POA: Diagnosis not present

## 2020-05-30 DIAGNOSIS — L89151 Pressure ulcer of sacral region, stage 1: Secondary | ICD-10-CM | POA: Diagnosis not present

## 2020-05-30 DIAGNOSIS — S72001D Fracture of unspecified part of neck of right femur, subsequent encounter for closed fracture with routine healing: Secondary | ICD-10-CM | POA: Diagnosis not present

## 2020-05-30 DIAGNOSIS — I10 Essential (primary) hypertension: Secondary | ICD-10-CM | POA: Diagnosis not present

## 2020-05-30 DIAGNOSIS — G9341 Metabolic encephalopathy: Secondary | ICD-10-CM | POA: Diagnosis not present

## 2020-05-30 DIAGNOSIS — Z471 Aftercare following joint replacement surgery: Secondary | ICD-10-CM | POA: Diagnosis not present

## 2020-05-30 DIAGNOSIS — J302 Other seasonal allergic rhinitis: Secondary | ICD-10-CM | POA: Diagnosis not present

## 2020-05-30 DIAGNOSIS — F331 Major depressive disorder, recurrent, moderate: Secondary | ICD-10-CM | POA: Diagnosis not present

## 2020-05-30 DIAGNOSIS — S72001A Fracture of unspecified part of neck of right femur, initial encounter for closed fracture: Secondary | ICD-10-CM | POA: Diagnosis not present

## 2020-05-30 DIAGNOSIS — G629 Polyneuropathy, unspecified: Secondary | ICD-10-CM | POA: Diagnosis not present

## 2020-05-30 DIAGNOSIS — M2550 Pain in unspecified joint: Secondary | ICD-10-CM | POA: Diagnosis not present

## 2020-05-30 DIAGNOSIS — R410 Disorientation, unspecified: Secondary | ICD-10-CM | POA: Diagnosis not present

## 2020-05-30 DIAGNOSIS — G894 Chronic pain syndrome: Secondary | ICD-10-CM | POA: Diagnosis not present

## 2020-05-30 LAB — COMPREHENSIVE METABOLIC PANEL
ALT: 15 U/L (ref 0–44)
AST: 20 U/L (ref 15–41)
Albumin: 2.6 g/dL — ABNORMAL LOW (ref 3.5–5.0)
Alkaline Phosphatase: 54 U/L (ref 38–126)
Anion gap: 7 (ref 5–15)
BUN: 25 mg/dL — ABNORMAL HIGH (ref 8–23)
CO2: 25 mmol/L (ref 22–32)
Calcium: 8.1 mg/dL — ABNORMAL LOW (ref 8.9–10.3)
Chloride: 101 mmol/L (ref 98–111)
Creatinine, Ser: 0.93 mg/dL (ref 0.61–1.24)
GFR calc Af Amer: >60 mL/min (ref >60)
GFR calc non Af Amer: >60 mL/min (ref >60)
Glucose, Bld: 109 mg/dL — ABNORMAL HIGH (ref 70–99)
Potassium: 3.7 mmol/L (ref 3.5–5.1)
Sodium: 133 mmol/L — ABNORMAL LOW (ref 135–145)
Total Bilirubin: 0.3 mg/dL (ref 0.3–1.2)
Total Protein: 5.5 g/dL — ABNORMAL LOW (ref 6.5–8.1)

## 2020-05-30 LAB — CULTURE, BLOOD (ROUTINE X 2)
Culture: NO GROWTH
Culture: NO GROWTH
Special Requests: ADEQUATE
Special Requests: ADEQUATE

## 2020-05-30 NOTE — Progress Notes (Signed)
Pharmacy Antibiotic Note  Christian Miller. is a 85 y.o. male with hx dementia presented to [facility] s/p fall and was found to have right hip fracture.  She was transferred to [facility] on 05/21/2020 and underwent post right total hip arthroplasty 05/23/2020.  Pharmacy is consulted to start cefepime on 8/14 for PNA.  Day 4 Cefepime Tmax 100 WBC 12.6 improving Renal function stable   Plan: Continue cefepime 2g IV q8h  ___________________________________________  Height: 6' (182.9 cm) Weight: 96 kg (211 lb 10.3 oz) IBW/kg (Calculated) : 77.6  Temp (24hrs), Avg:98.7 F (37.1 C), Min:97.8 F (36.6 C), Max:100 F (37.8 C)  Recent Labs  Lab 05/25/20 0250 05/25/20 0250 05/26/20 0306 05/27/20 0245 05/27/20 0720 05/28/20 0302 05/29/20 0324 05/30/20 0251  WBC 17.3*  --  17.3*  --  15.1* 13.7* 12.6*  --   CREATININE 1.28*   < > 0.88 0.91  --  0.89 0.88 0.93   < > = values in this interval not displayed.    Estimated Creatinine Clearance: 73.6 mL/min (by C-G formula based on SCr of 0.93 mg/dL).    Allergies  Allergen Reactions  . Pneumococcal Vaccines Rash     Thank you for allowing pharmacy to be a part of this patient's care.  Kara Mead 05/30/2020 9:04 AM

## 2020-05-30 NOTE — Progress Notes (Signed)
Physical Therapy Treatment Patient Details Name: Christian ABALOS Sr. MRN: [id number] DOB: June 16, 1935 Today's Date: 05/30/2020    History of Present Illness 85 y.o. male with a past medical history of dementia, overactive bladder, essential hypertension, behavioral disturbances secondary to dementia, hypothyroidism and admitted with displaced right femoral neck fracture and s/p R THA with posterior approach 05/23/20    PT Comments    POD # 7 Assisted OOB to [facility] then amb.  General bed mobility comments: Assist for trunk and LEs. Utilized bedpad to aid with scooting, positioning.  Increased self ability but 75% VC's on THP.  General transfer comment: + 2 side by side assist with 75% VC's on proper hand placement, proper hand transfer and tactile cueing for upright posture.  Shaky, rigid, unsteady with initial posterior lean General Gait Details: Used EVA walker for increased support pt was able to progress gait  Positioned in recliner and set up lunch.      Follow Up Recommendations  SNF     Equipment Recommendations       Recommendations for Other Services       Precautions / Restrictions Precautions Precautions: Posterior Hip;Fall Precaution Comments: reviewed posterior hip precautions pt unable to recall any Restrictions Weight Bearing Restrictions: No RLE Weight Bearing: Weight bearing as tolerated    Mobility  Bed Mobility Overal bed mobility: Needs Assistance Bed Mobility: Supine to Sit     Supine to sit: Max assist     General bed mobility comments: Assist for trunk and LEs. Utilized bedpad to aid with scooting, positioning.  Increased self ability but 75% VC's on THP.  Transfers Overall transfer level: Needs assistance Equipment used: Bilateral platform walker (EVA walker) Transfers: Sit to/from Stand           General transfer comment: + 2 side by side assist with 75% VC's on proper hand placement, proper hand transfer and tactile cueing for upright posture.   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[employer] [place] (Stroke Patients Only)       Balance                                            Cognition Arousal/Alertness: Awake/alert Behavior During Therapy: WFL for tasks assessed/performed Overall Cognitive Status: History of cognitive impairments - at baseline                                 General Comments: hx of dementia, pleasant, following repeat functional commands      Exercises      General Comments        Pertinent Vitals/Pain Pain Assessment: Faces Faces Pain Scale: Hurts a little bit Pain Location: right hip Pain Descriptors / Indicators: Aching;Discomfort;Sore;Grimacing;Guarding Pain Intervention(s): Monitored during session;Repositioned    Home Living                      Prior Function  PT Goals (current goals can now be found in the care plan section) Progress towards PT goals: Progressing toward goals    Frequency    Min 3X/week      PT Plan Frequency needs to be updated;Discharge plan needs to be updated    Co-evaluation              AM-PAC PT "6 Clicks" Mobility   Outcome Measure  Help needed turning from your back to your side while in a flat bed without using bedrails?: A Lot Help needed moving from lying on your back to sitting on the side of a flat bed without using bedrails?: A Lot Help needed moving to and from a bed to a chair (including a wheelchair)?: A Lot   Help  needed to walk in [facility] room?: A Lot Help needed climbing 3-5 steps with a railing? : Total 6 Click Score: 9    End of Session Equipment Utilized During Treatment: Gait belt Activity Tolerance: Patient tolerated treatment well Patient left: in chair;with chair alarm set;with call bell/phone within reach Nurse Communication: Mobility status PT Visit Diagnosis: History of falling (Z91.81);Muscle weakness (generalized) (M62.81);Other abnormalities of gait and mobility (R26.89);Pain Pain - Right/Left: Right Pain - part of body: Hip     Time: 1200-1225 PT Time Calculation (min) (ACUTE ONLY): 25 min  Charges:  $Gait Training: 8-22 mins $Therapeutic Activity: 8-22 mins                     [name]  PTA Acute  Rehabilitation Services Pager      [phone] Office      [phone]

## 2020-05-30 NOTE — Progress Notes (Signed)
Report called to [place] at [facility] [place]

## 2020-05-30 NOTE — TOC Transition Note (Signed)
Transition of Care [facility]) - CM/SW Discharge Note   Patient Details  Name: Christian QUANT Sr. MRN: [id number] Date of Birth: 1934/12/19  [facility] CM/SW Contact:  Lennart Pall, LCSW Phone Number: 05/30/2020, 1:20 PM   Clinical Narrative:    Pt tp transfer today to [place] via [place].  Pt and family agreeable and no further TOC needs.   Final next level of care: Skilled Nursing Facility Barriers to Discharge: Barriers Resolved   Patient Goals and CMS Choice Patient states their goals for this hospitalization and ongoing recovery are:: to eventually be able to go home      Discharge Placement              Patient chooses bed at: [facility] Patient to be transferred to facility by: [place] Name of family member notified: wife, Riven Mabile Patient and family notified of of transfer: 05/30/20  Discharge Plan and Services                DME Arranged: N/A DME Agency: NA       HH Arranged: NA [place] Agency: NA        Social Determinants of Health (SDOH) Interventions     Readmission Risk Interventions No flowsheet data found.

## 2020-05-30 NOTE — Progress Notes (Signed)
Subjective: 7 Days Post-Op s/p Procedure(s):TOTAL HIP ARTHROPLASTY  Patient alert, states he feels better this morning. States he has moderate sharp pains at his incision. Denies chest pain, shortness of breath, abdominal pain, nausea, or vomiting. States he is familiar with [facility] in [place] and ok with discharging there.   Objective:  PE: VITALS:   Vitals:   05/29/20 1200 05/29/20 1345 05/29/20 2109 05/30/20 0500  BP:  (!) 139/55 121/66 (!) 118/54  Pulse:  (!) 55 (!) 58 (!) 54  Resp:  16 15 16   Temp:  98.2 F (36.8 C) 98.9 F (37.2 C) 100 F (37.8 C)  TempSrc:  Oral Axillary Axillary  SpO2:  97% 92% 91%  Weight: 96 kg     Height:       General: Alert, oriented x 3, in no acute distress, appears less confused than previous visits, speech more clear today MSK:  Neurovascular intact Sensation intact distally Intact pulses distally Dorsiflexion/Plantar flexion intact Incision: no drainage  LABS  Results for orders placed or performed during the hospital encounter of 05/21/20 (from the past 24 hour(s))  SARS Coronavirus 2 by RT PCR (hospital order, performed in [place] hospital lab) Nasopharyngeal Nasopharyngeal Swab     Status: None   Collection Time: 05/29/20  6:23 PM   Specimen: Nasopharyngeal Swab  Result Value Ref Range   SARS Coronavirus 2 NEGATIVE NEGATIVE  Comprehensive metabolic panel     Status: Abnormal   Collection Time: 05/30/20  2:51 AM  Result Value Ref Range   Sodium 133 (L) 135 - 145 mmol/L   Potassium 3.7 3.5 - 5.1 mmol/L   Chloride 101 98 - 111 mmol/L   CO2 25 22 - 32 mmol/L   Glucose, Bld 109 (H) 70 - 99 mg/dL   BUN 25 (H) 8 - 23 mg/dL   Creatinine, Ser 0.93 0.61 - 1.24 mg/dL   Calcium 8.1 (L) 8.9 - 10.3 mg/dL   Total Protein 5.5 (L) 6.5 - 8.1 g/dL   Albumin 2.6 (L) 3.5 - 5.0 g/dL   AST 20 15 - 41 U/L   ALT 15 0 - 44 U/L   Alkaline Phosphatase 54 38 - 126 U/L   Total Bilirubin 0.3 0.3 - 1.2 mg/dL   GFR calc non Af Amer >60 >60  mL/min   GFR calc Af Amer >60 >60 mL/min   Anion gap 7 5 - 15    DG Pelvis Portable  Result Date: 05/29/2020 CLINICAL DATA:  84 year old male status post right hip replacement. EXAM: PORTABLE PELVIS 1-2 VIEWS COMPARISON:  Pelvic radiograph dated 05/23/2020 FINDINGS: Bilateral hip arthroplasties. The arthroplasty components appear intact and in anatomic alignment. There is no acute fracture or dislocation. The bones are osteopenic. Degenerative changes of the lower lumbar spine. IMPRESSION: No acute findings. No immediate complications status post right hip arthroplasty. Electronically Signed   By: Anner Crete M.D.   On: 05/29/2020 19:27    Assessment/Plan:  Closed right hip fracture: 7 Days Post-Op s/p Procedure(s): TOTAL HIP ARTHROPLASTY - confusion improved today  Weightbearing: WBAT RLE, up with therapy - patient still requiring +2 assist with shaky, rigid, and unsteady gait Insicional and dressing care: Reinforce dressings as needed, dressing changed yesterday Orthopedic device(s): None VTE prophylaxis: lovenox x 30 days Pain control: tylenol Follow - up plan: 2 weeks with Dr. Mardelle Matte Dispo: PT recommending SNF, patient has bed at [place] information:   Weekdays 8-5 Merlene Pulling, PA-C [phone]  A fter hours and holidays please check [email or web address] for group call information for [facility]  Ventura Bruns 05/30/2020, 8:43 AM

## 2020-05-30 NOTE — Consult Note (Signed)
[facility] Nurse Consult Note: Reason for Consult:Deep tissue injury to sacrum, present on admission.  Wound type:pressure injury full thickness Pressure Injury POA: Yes Measurement: Left gluteal 4 cm x 2.5 cm with maroon discoloration of 2 cm at proximal end, intact Right gluteal 3 cm x 2 cm nonblanchable erythema Coccyx:  1 cm x 1 cm nonblanchable erythema Wound SXJ:DBZMCE Drainage (amount, consistency, odor) none Periwound:intact Dressing procedure/placement/frequency: Will implement barrier cream to promote moist wound healing and sacral foam dressing.  Turn and reposition every two hours.  Noted hip fracture.  Will discharge to SNF today.  Will not follow at this time.  Please re-consult if needed.  Domenic Moras MSN, RN, FNP-BC CWON Wound, Ostomy, Continence Nurse Pager [phone]

## 2020-05-30 NOTE — Discharge Summary (Signed)
Physician Discharge Summary  RANGEL ECHEVERRI Sr. XBD:[id number] DOB: 06-04-1935 DOA: 05/21/2020  PCP: [name], MD  Admit date: 05/21/2020 Discharge date: 05/30/2020  Admitted From: Home Disposition: Nursing home Recommendations for Outpatient Follow-up:  1. Follow up with PCP in 1-2 weeks 2. Please obtain BMP/CBC in one week 3. Follow-up with Ortho  Home Health: None Equipment/Devices none Discharge Condition: Stable CODE STATUS full code Diet recommendation: Cardiac Brief/Interim Summary:Christian Milleris a 85 y.o.malewith a past medical history of dementia, overactive bladder, essential hypertension, behavioral disturbances secondary to dementia, hypothyroidism who was in his usual state of health till yesterday when he was in the kitchen and he lost his balance and he fell.  Evaluation at [facility] revealed right hip fracture. There was no orthopedic coverage at that facility and thus, the patient was transferred to [facility].   Discharge Diagnoses:  Principal Problem:   Closed right hip fracture ([place]) Active Problems:   HTN (hypertension)   OAB (overactive bladder)   Dementia without behavioral disturbance (HCC)   Confusion   Pressure injury of skin  #1 Right Hip fracture status post right total hip arthroplasty  05/23/2020.  Patient seen by PT recommending skilled nursing facility for rehabilitation.   #2 Essential HTN -blood pressure 139/55 on Norvasc.    #3Hypothyroidsm continue Synthroid TSH normal  #4 neuropathy on gabapentin  #5 dementia without behavioral disturbance-dc zyprexa.  Continue Depakote Namenda and Zoloft.  Patient was not on Zyprexa at home.  #6 overactive bladder/bph -continue alfuzosin.  #7 hyponatremia sodium improved to 133.  His serum osmolality is normal.    #8hcap/ leukocytosis -resolving on antibiotics.  He was treated with cefepime.  He will be discharged on Augmentin for 2 days. Chest x-ray 05/27/2020 with  improvement.  #9 AKI resolved creatinine 0.91.   #83  Acute metabolic encephalopathy -altered mental status/intention tremors in the setting of dementia.  CT head shows no acute abnormalities but does show chronic small vessel ischemic changes and atrophy.   #11 stage I deep tissue injury to the sacral medial area-barrier cream to promote moist wound healing and cyclic foam dressing.  Turn and reposition every 2 hours.   Pressure Injury 05/26/20 Buttocks Medial Stage 1 -  Intact skin with non-blanchable redness of a localized area usually over a bony prominence. (Active)  05/26/20 0750  Location: Buttocks  Location Orientation: Medial  Staging: Stage 1 -  Intact skin with non-blanchable redness of a localized area usually over a bony prominence.  Wound Description (Comments):   Present on Admission:       Nutrition Problem: Increased nutrient needs Etiology: post-op healing    Signs/Symptoms: estimated needs     Interventions: MVI, Other (Comment) (ProSource Plus; Dillard Essex)  Estimated body mass index is 28.7 kg/m as calculated from the following:   Height as of this encounter: 6' (1.829 m).   Weight as of this encounter: 96 kg.  Discharge Instructions  Discharge Instructions    Diet - low sodium heart healthy   Complete by: As directed    Discharge wound care:   Complete by: As directed    See wound care orders   Increase activity slowly   Complete by: As directed      Allergies as of 05/30/2020      Reactions   Pneumococcal Vaccines Rash      Medication List    STOP taking these medications   darifenacin 7.5 MG 24 hr tablet Commonly known as:  ENABLEX   HYDROcodone-acetaminophen 5-325 MG tablet Commonly known as: Norco   Myrbetriq 50 MG Tb24 tablet Generic drug: mirabegron ER   Trospium Chloride 60 MG Cp24     TAKE these medications   acetaminophen 325 MG tablet Commonly known as: TYLENOL Take 2 tablets (650 mg total) by mouth 3 (three) times  daily.   alfuzosin 10 MG 24 hr tablet Commonly known as: UROXATRAL Take 1 tablet (10 mg total) by mouth at bedtime. What changed: when to take this   amLODipine 2.5 MG tablet Commonly known as: NORVASC Take 2.5 mg by mouth daily.   amoxicillin-clavulanate 875-125 MG tablet Commonly known as: Augmentin Take 1 tablet by mouth 2 (two) times daily for 2 days.   aspirin EC 81 MG tablet Take 81 mg by mouth every Monday, Wednesday, and Friday. At night.   divalproex 125 MG DR tablet Commonly known as: DEPAKOTE Take 125 mg by mouth at bedtime.   donepezil 5 MG tablet Commonly known as: ARICEPT Take 5 mg by mouth every morning.   enoxaparin 40 MG/0.4ML injection Commonly known as: LOVENOX Inject 0.4 mLs (40 mg total) into the skin daily for 21 days.   ferrous sulfate 325 (65 FE) MG tablet Take 1 tablet (325 mg total) by mouth 3 (three) times daily after meals.   Fish Oil 1000 MG Caps Take 1,000 mg by mouth every other day.   fluticasone 50 MCG/ACT nasal spray Commonly known as: FLONASE Place 2 sprays into both nostrils daily.   Folate 400 MCG tablet Generic drug: folic acid Take 1 tablet by mouth at bedtime.   gabapentin 600 MG tablet Commonly known as: NEURONTIN Take 600 mg by mouth 3 (three) times daily.   hydrOXYzine 10 MG tablet Commonly known as: ATARAX/VISTARIL Take 20 mg by mouth at bedtime.   levothyroxine 75 MCG tablet Commonly known as: SYNTHROID Take 75 mcg by mouth every morning.   memantine 5 MG tablet Commonly known as: NAMENDA Take 5 mg by mouth 2 (two) times daily.   multivitamins ther. w/minerals Tabs tablet Take 1 tablet by mouth daily.   nystatin cream Commonly known as: MYCOSTATIN Apply 1 application topically 2 (two) times daily.   omeprazole 20 MG capsule Commonly known as: PRILOSEC Take 20 mg by mouth 2 (two) times daily before a meal.   ondansetron 4 MG tablet Commonly known as: ZOFRAN Take 1 tablet (4 mg total) by mouth every 6  (six) hours as needed for nausea.   polyethylene glycol powder 17 GM/SCOOP powder Commonly known as: GLYCOLAX/MIRALAX Take 17 g by mouth daily.   saccharomyces boulardii 250 MG capsule Commonly known as: FLORASTOR Take 1 capsule (250 mg total) by mouth 2 (two) times daily.   sertraline 100 MG tablet Commonly known as: ZOLOFT Take 100 mg by mouth in the morning and at bedtime.   Vitamin D3 50 MCG (2000 UT) Tabs Take 2,000 Units by mouth daily.            Discharge Care Instructions  (From admission, onward)         Start     Ordered   05/30/20 0000  Discharge wound care:       Comments: See wound care orders   05/30/20 1138          Contact information for follow-up providers    Marchia Bond, MD. Schedule an appointment as soon as possible for a visit in 2 weeks.   Specialty: Orthopedic Surgery Contact information: [place]  [place] [address] [phone]            Contact information for after-discharge care    [facility] .   Service: Skilled Nursing Contact information: [address] [address] [phone]                 Allergies  Allergen Reactions  . Pneumococcal Vaccines Rash    Consultations:  Ortho   Procedures/Studies: DG Chest 1 View  Result Date: 05/27/2020 CLINICAL DATA:  Cough. EXAM: CHEST  1 VIEW COMPARISON:  Chest radiograph 05/25/2020. FINDINGS: Stable cardiac and mediastinal contours. Tortuosity of the thoracic aorta. Slight interval improvement left basilar opacities. Small left pleural effusion. No pneumothorax. Thoracic spine degenerative changes. IMPRESSION: Slight interval decrease opacities left lower hemithorax. Small left effusion. Electronically Signed   By: Lovey Newcomer M.D.   On: 05/27/2020 15:31   DG Chest 1 View  Result Date: 05/25/2020 CLINICAL DATA:  Two days postop following right hip replacement EXAM: CHEST  1  VIEW COMPARISON:  05/21/2020 FINDINGS: Mild left basilar atelectasis is again noted and stable. No new focal infiltrate is noted. Small left effusion is seen as well. Aortic calcifications are noted. Cardiac shadow is stable. IMPRESSION: Mild left basilar atelectasis and effusions stable from the prior exam. Electronically Signed   By: Inez Catalina M.D.   On: 05/25/2020 12:29   CT HEAD WO CONTRAST  Result Date: 05/27/2020 CLINICAL DATA:  Altered mental status. EXAM: CT HEAD WITHOUT CONTRAST TECHNIQUE: Contiguous axial images were obtained from the base of the skull through the vertex without intravenous contrast. COMPARISON:  06/03/2019 FINDINGS: Brain: Ventricles, cisterns and other CSF spaces are within normal. There is mild chronic ischemic microvascular disease. Minimal age related atrophic change. No mass, mass effect, shift of midline structures or acute hemorrhage. No acute infarction. Vascular: No hyperdense vessel or unexpected calcification. Skull: Normal. Negative for fracture or focal lesion. Sinuses/Orbits: No acute finding. Other: None. IMPRESSION: 1. No acute findings. 2. Mild chronic ischemic microvascular disease and age related atrophic change. Electronically Signed   By: Marin Olp M.D.   On: 05/27/2020 11:40   DG Pelvis Portable  Result Date: 05/29/2020 CLINICAL DATA:  85 year old male status post right hip replacement. EXAM: PORTABLE PELVIS 1-2 VIEWS COMPARISON:  Pelvic radiograph dated 05/23/2020 FINDINGS: Bilateral hip arthroplasties. The arthroplasty components appear intact and in anatomic alignment. There is no acute fracture or dislocation. The bones are osteopenic. Degenerative changes of the lower lumbar spine. IMPRESSION: No acute findings. No immediate complications status post right hip arthroplasty. Electronically Signed   By: Anner Crete M.D.   On: 05/29/2020 19:27   Pelvis Portable  Result Date: 05/23/2020 CLINICAL DATA:  Status post ORIF right hip. EXAM:  PORTABLE PELVIS 1-2 VIEWS COMPARISON:  Concurrent postoperative hip exam. FINDINGS: Right hip arthroplasty in expected alignment. No periprosthetic lucency. Distal aspect of the femoral stem is included on concurrent hip exam. Recent postsurgical change includes air and edema in the joint space and soft tissues. Left hip arthroplasty is intact were visualized. IMPRESSION: Post right hip arthroplasty without immediate postoperative complication. Electronically Signed   By: Keith Rake M.D.   On: 05/23/2020 17:34   DG CHEST PORT 1 VIEW  Result Date: 05/21/2020 CLINICAL DATA:  Pre-op for hip fracture surgery. Denies any chest complaints today. H/o COPD, HTN. Former smoker. EXAM: PORTABLE CHEST 1 VIEW COMPARISON:  None. FINDINGS: Normal cardiac silhouette. Mild LEFT  basilar atelectasis and potential small LEFT effusion. RIGHT lung clear. No pneumothorax. No acute osseous abnormality. IMPRESSION: LEFT basilar atelectasis and small effusion. Electronically Signed   By: Suzy Bouchard M.D.   On: 05/21/2020 13:38   DG Hip Port Unilat With Pelvis 1V Right  Result Date: 05/23/2020 CLINICAL DATA:  Postop. EXAM: DG HIP (WITH OR WITHOUT PELVIS) 1V PORT RIGHT COMPARISON:  Preoperative radiograph 05/21/2020 FINDINGS: Right hip arthroplasty in expected alignment. No periprosthetic lucency or fracture. Recent postsurgical change includes air and edema in the joint space and soft tissues. IMPRESSION: Right hip arthroplasty without immediate postoperative complication. Electronically Signed   By: Keith Rake M.D.   On: 05/23/2020 17:27   DG HIP UNILAT WITH PELVIS 2-3 VIEWS RIGHT  Result Date: 05/21/2020 CLINICAL DATA:  Known right hip fracture EXAM: DG HIP (WITH OR WITHOUT PELVIS) 2-3V RIGHT; RIGHT FEMUR 2 VIEWS COMPARISON:  05/20/2020 FINDINGS: Redemonstrated impacted subcapital fracture of the right femoral neck. There are small avulsion fracture fragments or vascular calcifications adjacent to the right lesser  trochanter. No fracture or dislocation of the distal right femur. No displaced fracture or dislocation of the pelvis seen in single frontal view. Status post left hip total arthroplasty. IMPRESSION: 1. Redemonstrated impacted subcapital fracture of the right femoral neck. There are small avulsion fracture fragments or vascular calcifications adjacent to the right lesser trochanter without other obvious fracture. 2.  The distal right femur is intact. Electronically Signed   By: Eddie Candle M.D.   On: 05/21/2020 14:09   DG FEMUR, MIN 2 VIEWS RIGHT  Result Date: 05/21/2020 CLINICAL DATA:  Known right hip fracture EXAM: DG HIP (WITH OR WITHOUT PELVIS) 2-3V RIGHT; RIGHT FEMUR 2 VIEWS COMPARISON:  05/20/2020 FINDINGS: Redemonstrated impacted subcapital fracture of the right femoral neck. There are small avulsion fracture fragments or vascular calcifications adjacent to the right lesser trochanter. No fracture or dislocation of the distal right femur. No displaced fracture or dislocation of the pelvis seen in single frontal view. Status post left hip total arthroplasty. IMPRESSION: 1. Redemonstrated impacted subcapital fracture of the right femoral neck. There are small avulsion fracture fragments or vascular calcifications adjacent to the right lesser trochanter without other obvious fracture. 2.  The distal right femur is intact. Electronically Signed   By: Eddie Candle M.D.   On: 05/21/2020 14:09    (Echo, Carotid, EGD, Colonoscopy, ERCP)    Subjective: Patient resting in bed no events overnight  Discharge Exam: Vitals:   05/30/20 0500 05/30/20 0900  BP: (!) 118/54 131/67  Pulse: (!) 54 (!) 54  Resp: 16 18  Temp: 100 F (37.8 C) 97.8 F (36.6 C)  SpO2: 91% 94%   Vitals:   05/29/20 1345 05/29/20 2109 05/30/20 0500 05/30/20 0900  BP: (!) 139/55 121/66 (!) 118/54 131/67  Pulse: (!) 55 (!) 58 (!) 54 (!) 54  Resp: 16 15 16 18   Temp: 98.2 F (36.8 C) 98.9 F (37.2 C) 100 F (37.8 C) 97.8 F  (36.6 C)  TempSrc: Oral Axillary Axillary Oral  SpO2: 97% 92% 91% 94%  Weight:      Height:        General: Pt is alert, awake, not in acute distress Cardiovascular: RRR, S1/S2 +, no rubs, no gallops Respiratory: CTA bilaterally, no wheezing, no rhonchi Abdominal: Soft, NT, ND, bowel sounds + Extremities: no edema, no cyanosis    The results of significant diagnostics from this hospitalization (including imaging, microbiology, ancillary and laboratory) are listed below for reference.  Microbiology: Recent Results (from the past 240 hour(s))  SARS Coronavirus 2 by RT PCR (hospital order, performed in [facility] hospital lab) Nasopharyngeal Nasal Mucosa     Status: None   Collection Time: 05/22/20  2:00 AM   Specimen: Nasal Mucosa; Nasopharyngeal  Result Value Ref Range Status   SARS Coronavirus 2 NEGATIVE NEGATIVE Final    Comment: (NOTE) SARS-CoV-2 target nucleic acids are NOT DETECTED.  The SARS-CoV-2 RNA is generally detectable in upper and lower respiratory specimens during the acute phase of infection. The lowest concentration of SARS-CoV-2 viral copies this assay can detect is 250 copies / mL. A negative result does not preclude SARS-CoV-2 infection and should not be used as the sole basis for treatment or other patient management decisions.  A negative result may occur with improper specimen collection / handling, submission of specimen other than nasopharyngeal swab, presence of viral mutation(s) within the areas targeted by this assay, and inadequate number of viral copies (<250 copies / mL). A negative result must be combined with clinical observations, patient history, and epidemiological information.  Fact Sheet for Patients:   StrictlyIdeas.no  Fact Sheet for Healthcare Providers: BankingDealers.co.za  This test is not yet approved or  cleared by the [place] FDA and has been authorized for detection  and/or diagnosis of SARS-CoV-2 by FDA under an Emergency Use Authorization (EUA).  This EUA will remain in effect (meaning this test can be used) for the duration of the COVID-19 declaration under Section 564(b)(1) of the Act, 21 U.S.C. section 360bbb-3(b)(1), unless the authorization is terminated or revoked sooner.  Performed at [facility], [place] [address] [place], [place] [address]   MRSA PCR Screening     Status: None   Collection Time: 05/23/20 12:31 AM   Specimen: Nasal Mucosa; Nasopharyngeal  Result Value Ref Range Status   MRSA by PCR NEGATIVE NEGATIVE Final    Comment:        The GeneXpert MRSA Assay (FDA approved for NASAL specimens only), is one component of a comprehensive MRSA colonization surveillance program. It is not intended to diagnose MRSA infection nor to guide or monitor treatment for MRSA infections. Performed at [facility], [place] [address], [place], [place] [address]   Culture, blood (routine x 2)     Status: None   Collection Time: 05/25/20 10:35 AM   Specimen: BLOOD  Result Value Ref Range Status   Specimen Description   Final    BLOOD LEFT ANTECUBITAL Performed at [facility], [place] [address] [place], [place] [address]    Special Requests   Final    BOTTLES DRAWN AEROBIC AND ANAEROBIC Blood Culture adequate volume Performed at [place] [address], [place], [place] [address]    Culture   Final    NO GROWTH 5 DAYS Performed at [facility], [place] [address], [place], [place] [address]    Report Status 05/30/2020 FINAL  Final  Culture, blood (routine x 2)     Status: None   Collection Time: 05/25/20 10:44 AM   Specimen: BLOOD LEFT ARM  Result Value Ref Range Status   Specimen Description   Final    BLOOD LEFT ARM Performed at [place] [address], [place], [place] [address]    Special Requests   Final    BOTTLES DRAWN AEROBIC AND  ANAEROBIC Blood Culture adequate volume Performed at [place] [address] [place], [place]  [address]    Culture   Final    NO GROWTH 5 DAYS Performed at [facility], [place] [address], [place], [place] [address]    Report Status 05/30/2020 FINAL  Final  SARS Coronavirus 2 by RT PCR (hospital order, performed in [facility] hospital lab) Nasopharyngeal Nasopharyngeal Swab     Status: None   Collection Time: 05/29/20  6:23 PM   Specimen: Nasopharyngeal Swab  Result Value Ref Range Status   SARS Coronavirus 2 NEGATIVE NEGATIVE Final    Comment: (NOTE) SARS-CoV-2 target nucleic acids are NOT DETECTED.  The SARS-CoV-2 RNA is generally detectable in upper and lower respiratory specimens during the acute phase of infection. The lowest concentration of SARS-CoV-2 viral copies this assay can detect is 250 copies / mL. A negative result does not preclude SARS-CoV-2 infection and should not be used as the sole basis for treatment or other patient management decisions.  A negative result may occur with improper specimen collection / handling, submission of specimen other than nasopharyngeal swab, presence of viral mutation(s) within the areas targeted by this assay, and inadequate number of viral copies (<250 copies / mL). A negative result must be combined with clinical observations, patient history, and epidemiological information.  Fact Sheet for Patients:   StrictlyIdeas.no  Fact Sheet for Healthcare Providers: BankingDealers.co.za  This test is not yet approved or  cleared by the [place] FDA and has been authorized for detection and/or diagnosis of SARS-CoV-2 by FDA under an Emergency Use Authorization (EUA).  This EUA will remain in effect (meaning this test can be used) for the duration of the COVID-19 declaration under Section 564(b)(1) of the Act, 21 U.S.C. section 360bbb-3(b)(1), unless the  authorization is terminated or revoked sooner.  Performed at [facility], [place] [address], [place], [place] [address]      Labs: BNP (last 3 results) No results for input(s): BNP in the last 8760 hours. Basic Metabolic Panel: Recent Labs  Lab 05/26/20 0306 05/27/20 0245 05/28/20 0302 05/29/20 0324 05/30/20 0251  NA 133* 127* 133* 133* 133*  K 4.2 3.8 4.4 3.9 3.7  CL 101 97* 100 98 101  CO2 24 23 25 25 25   GLUCOSE 138* 100* 108* 134* 109*  BUN 19 20 19 18  25*  CREATININE 0.88 0.91 0.89 0.88 0.93  CALCIUM 8.5* 7.6* 8.4* 8.4* 8.1*   Liver Function Tests: Recent Labs  Lab 05/26/20 0306 05/27/20 0245 05/28/20 0302 05/29/20 0324 05/30/20 0251  AST 22 23 24 27 20   ALT 11 11 14 17 15   ALKPHOS 60 55 61 57 54  BILITOT 0.6 0.5 0.6 0.6 0.3  PROT 5.8* 5.6* 5.8* 5.6* 5.5*  ALBUMIN 2.9* 2.6* 2.7* 2.8* 2.6*   No results for input(s): LIPASE, AMYLASE in the last 168 hours. No results for input(s): AMMONIA in the last 168 hours. CBC: Recent Labs  Lab 05/25/20 0250 05/26/20 0306 05/27/20 0720 05/28/20 0302 05/29/20 0324  WBC 17.3* 17.3* 15.1* 13.7* 12.6*  HGB 8.6* 8.9* 8.5* 8.6* 8.6*  HCT 26.9* 27.7* 26.4* 27.1* 26.4*  MCV 92.1 92.0 91.3 91.2 91.0  PLT 204 216 262 258 294   Cardiac Enzymes: No results for input(s): CKTOTAL, CKMB, CKMBINDEX, TROPONINI in the last 168 hours. BNP: Invalid input(s): POCBNP CBG: No results for input(s): GLUCAP in the last 168 hours. D-Dimer No results for input(s): DDIMER in the last 72 hours. Hgb A1c No results for input(s): HGBA1C in the last 72 hours. Lipid Profile No results  for input(s): CHOL, HDL, LDLCALC, TRIG, CHOLHDL, LDLDIRECT in the last 72 hours. Thyroid function studies No results for input(s): TSH, T4TOTAL, T3FREE, THYROIDAB in the last 72 hours.  Invalid input(s): FREET3 Anemia work up No results for input(s): VITAMINB12, FOLATE, FERRITIN, TIBC, IRON, RETICCTPCT in the last 72 hours. Urinalysis     Component Value Date/Time   COLORURINE YELLOW 05/25/2020 [place] 05/25/2020 0943   LABSPEC 1.010 05/25/2020 0943   PHURINE 6.0 05/25/2020 0943   GLUCOSEU NEGATIVE 05/25/2020 0943   HGBUR NEGATIVE 05/25/2020 0943   BILIRUBINUR NEGATIVE 05/25/2020 0943   BILIRUBINUR neg 03/15/2020 1643   KETONESUR NEGATIVE 05/25/2020 0943   PROTEINUR NEGATIVE 05/25/2020 0943   UROBILINOGEN 0.2 03/15/2020 1643   UROBILINOGEN 0.2 12/06/2012 0812   NITRITE NEGATIVE 05/25/2020 0943   LEUKOCYTESUR NEGATIVE 05/25/2020 0943   Sepsis Labs Invalid input(s): PROCALCITONIN,  WBC,  LACTICIDVEN Microbiology Recent Results (from the past 240 hour(s))  SARS Coronavirus 2 by RT PCR (hospital order, performed in [facility] hospital lab) Nasopharyngeal Nasal Mucosa     Status: None   Collection Time: 05/22/20  2:00 AM   Specimen: Nasal Mucosa; Nasopharyngeal  Result Value Ref Range Status   SARS Coronavirus 2 NEGATIVE NEGATIVE Final    Comment: (NOTE) SARS-CoV-2 target nucleic acids are NOT DETECTED.  The SARS-CoV-2 RNA is generally detectable in upper and lower respiratory specimens during the acute phase of infection. The lowest concentration of SARS-CoV-2 viral copies this assay can detect is 250 copies / mL. A negative result does not preclude SARS-CoV-2 infection and should not be used as the sole basis for treatment or other patient management decisions.  A negative result may occur with improper specimen collection / handling, submission of specimen other than nasopharyngeal swab, presence of viral mutation(s) within the areas targeted by this assay, and inadequate number of viral copies (<250 copies / mL). A negative result must be combined with clinical observations, patient history, and epidemiological information.  Fact Sheet for Patients:   StrictlyIdeas.no  Fact Sheet for Healthcare Providers: BankingDealers.co.za  This test is  not yet approved or  cleared by the [place] FDA and has been authorized for detection and/or diagnosis of SARS-CoV-2 by FDA under an Emergency Use Authorization (EUA).  This EUA will remain in effect (meaning this test can be used) for the duration of the COVID-19 declaration under Section 564(b)(1) of the Act, 21 U.S.C. section 360bbb-3(b)(1), unless the authorization is terminated or revoked sooner.  Performed at [facility], [place] [address] [place], [place] [address]   MRSA PCR Screening     Status: None   Collection Time: 05/23/20 12:31 AM   Specimen: Nasal Mucosa; Nasopharyngeal  Result Value Ref Range Status   MRSA by PCR NEGATIVE NEGATIVE Final    Comment:        The GeneXpert MRSA Assay (FDA approved for NASAL specimens only), is one component of a comprehensive MRSA colonization surveillance program. It is not intended to diagnose MRSA infection nor to guide or monitor treatment for MRSA infections. Performed at [facility], [place] [address] [place], [place] [address]   Culture, blood (routine x 2)     Status: None   Collection Time: 05/25/20 10:35 AM   Specimen: BLOOD  Result Value Ref Range Status   Specimen Description   Final    BLOOD LEFT ANTECUBITAL Performed at [facility], [place] [address], [place], [place] [place] [address]    Special Requests  Final    BOTTLES DRAWN AEROBIC AND ANAEROBIC Blood Culture adequate volume Performed at [facility] [address], [place], [place] [address]    Culture   Final    NO GROWTH 5 DAYS Performed at [facility], [place] [address] [place], [place] [address]    Report Status 05/30/2020 FINAL  Final  Culture, blood (routine x 2)     Status: None   Collection Time: 05/25/20 10:44 AM   Specimen: BLOOD LEFT ARM  Result Value Ref Range Status   Specimen Description   Final    BLOOD LEFT ARM Performed at [facility] [address], [place], [place] [address]    Special Requests   Final    BOTTLES DRAWN AEROBIC AND ANAEROBIC Blood Culture adequate volume Performed at [facility] [address], [place], [place] [address]    Culture   Final    NO GROWTH 5 DAYS Performed at [facility], [place] [address], [place], [place] [address]    Report Status 05/30/2020 FINAL  Final  SARS Coronavirus 2 by RT PCR (hospital order, performed in [facility] hospital lab) Nasopharyngeal Nasopharyngeal Swab     Status: None   Collection Time: 05/29/20  6:23 PM   Specimen: Nasopharyngeal Swab  Result Value Ref Range Status   SARS Coronavirus 2 NEGATIVE NEGATIVE Final    Comment: (NOTE) SARS-CoV-2 target nucleic acids are NOT DETECTED.  The SARS-CoV-2 RNA is generally detectable in upper and lower respiratory specimens during the acute phase of infection. The lowest concentration of SARS-CoV-2 viral copies this assay can detect is 250 copies / mL. A negative result does not preclude SARS-CoV-2 infection and should not be used as the sole basis for treatment or other patient management decisions.  A negative result may occur with improper specimen collection / handling, submission of specimen other than nasopharyngeal swab, presence of viral mutation(s) within the areas targeted by this assay, and inadequate number of viral copies (<250 copies / mL). A negative result must be combined with clinical observations, patient history, and epidemiological information.  Fact Sheet for Patients:   StrictlyIdeas.no  Fact Sheet for Healthcare Providers: BankingDealers.co.za  This test is not yet approved or  cleared by the [place] FDA and has been authorized for detection and/or diagnosis of SARS-CoV-2 by FDA under an Emergency Use Authorization (EUA).  This EUA will remain in effect (meaning this test can be used) for the duration of the COVID-19 declaration  under Section 564(b)(1) of the Act, 21 U.S.C. section 360bbb-3(b)(1), unless the authorization is terminated or revoked sooner.  Performed at [facility], [place] [address] [place],  [address]      Time coordinating discharge: 39 minutes  SIGNED:   Georgette Shell, MD  [facility] 05/30/2020, 11:41 AM  If 7PM-7AM, please contact night-coverage [email or web address] Password [email or web address]

## 2020-05-31 DIAGNOSIS — F0390 Unspecified dementia without behavioral disturbance: Secondary | ICD-10-CM | POA: Diagnosis not present

## 2020-05-31 DIAGNOSIS — E785 Hyperlipidemia, unspecified: Secondary | ICD-10-CM | POA: Diagnosis not present

## 2020-05-31 DIAGNOSIS — L89151 Pressure ulcer of sacral region, stage 1: Secondary | ICD-10-CM | POA: Diagnosis not present

## 2020-05-31 DIAGNOSIS — E039 Hypothyroidism, unspecified: Secondary | ICD-10-CM | POA: Diagnosis not present

## 2020-05-31 DIAGNOSIS — I10 Essential (primary) hypertension: Secondary | ICD-10-CM | POA: Diagnosis not present

## 2020-06-02 DIAGNOSIS — E039 Hypothyroidism, unspecified: Secondary | ICD-10-CM | POA: Diagnosis not present

## 2020-06-02 DIAGNOSIS — F329 Major depressive disorder, single episode, unspecified: Secondary | ICD-10-CM | POA: Diagnosis not present

## 2020-06-02 DIAGNOSIS — L89151 Pressure ulcer of sacral region, stage 1: Secondary | ICD-10-CM | POA: Diagnosis not present

## 2020-06-02 DIAGNOSIS — I10 Essential (primary) hypertension: Secondary | ICD-10-CM | POA: Diagnosis not present

## 2020-06-09 DIAGNOSIS — R52 Pain, unspecified: Secondary | ICD-10-CM | POA: Diagnosis not present

## 2020-06-09 DIAGNOSIS — S72001D Fracture of unspecified part of neck of right femur, subsequent encounter for closed fracture with routine healing: Secondary | ICD-10-CM | POA: Diagnosis not present

## 2020-06-09 DIAGNOSIS — L89151 Pressure ulcer of sacral region, stage 1: Secondary | ICD-10-CM | POA: Diagnosis not present

## 2020-06-09 DIAGNOSIS — E039 Hypothyroidism, unspecified: Secondary | ICD-10-CM | POA: Diagnosis not present

## 2020-06-14 DIAGNOSIS — L89150 Pressure ulcer of sacral region, unstageable: Secondary | ICD-10-CM | POA: Diagnosis not present

## 2020-06-18 DIAGNOSIS — I440 Atrioventricular block, first degree: Secondary | ICD-10-CM | POA: Diagnosis not present

## 2020-06-18 DIAGNOSIS — Z96641 Presence of right artificial hip joint: Secondary | ICD-10-CM | POA: Diagnosis not present

## 2020-06-18 DIAGNOSIS — D509 Iron deficiency anemia, unspecified: Secondary | ICD-10-CM | POA: Diagnosis not present

## 2020-06-18 DIAGNOSIS — G9341 Metabolic encephalopathy: Secondary | ICD-10-CM | POA: Diagnosis not present

## 2020-06-18 DIAGNOSIS — L89312 Pressure ulcer of right buttock, stage 2: Secondary | ICD-10-CM | POA: Diagnosis not present

## 2020-06-18 DIAGNOSIS — G629 Polyneuropathy, unspecified: Secondary | ICD-10-CM | POA: Diagnosis not present

## 2020-06-18 DIAGNOSIS — I10 Essential (primary) hypertension: Secondary | ICD-10-CM | POA: Diagnosis not present

## 2020-06-18 DIAGNOSIS — E039 Hypothyroidism, unspecified: Secondary | ICD-10-CM | POA: Diagnosis not present

## 2020-06-18 DIAGNOSIS — L89152 Pressure ulcer of sacral region, stage 2: Secondary | ICD-10-CM | POA: Diagnosis not present

## 2020-06-18 DIAGNOSIS — S72001D Fracture of unspecified part of neck of right femur, subsequent encounter for closed fracture with routine healing: Secondary | ICD-10-CM | POA: Diagnosis not present

## 2020-06-18 DIAGNOSIS — M1990 Unspecified osteoarthritis, unspecified site: Secondary | ICD-10-CM | POA: Diagnosis not present

## 2020-06-18 DIAGNOSIS — F0280 Dementia in other diseases classified elsewhere without behavioral disturbance: Secondary | ICD-10-CM | POA: Diagnosis not present

## 2020-06-18 DIAGNOSIS — J449 Chronic obstructive pulmonary disease, unspecified: Secondary | ICD-10-CM | POA: Diagnosis not present

## 2020-06-22 DIAGNOSIS — M47816 Spondylosis without myelopathy or radiculopathy, lumbar region: Secondary | ICD-10-CM | POA: Diagnosis not present

## 2020-06-22 DIAGNOSIS — J189 Pneumonia, unspecified organism: Secondary | ICD-10-CM | POA: Diagnosis not present

## 2020-06-22 DIAGNOSIS — Z471 Aftercare following joint replacement surgery: Secondary | ICD-10-CM | POA: Diagnosis not present

## 2020-06-22 DIAGNOSIS — Z96643 Presence of artificial hip joint, bilateral: Secondary | ICD-10-CM | POA: Diagnosis not present

## 2020-06-22 DIAGNOSIS — G9341 Metabolic encephalopathy: Secondary | ICD-10-CM | POA: Diagnosis not present

## 2020-06-22 DIAGNOSIS — L89302 Pressure ulcer of unspecified buttock, stage 2: Secondary | ICD-10-CM | POA: Diagnosis not present

## 2020-06-22 DIAGNOSIS — L89159 Pressure ulcer of sacral region, unspecified stage: Secondary | ICD-10-CM | POA: Diagnosis not present

## 2020-06-22 DIAGNOSIS — Z96641 Presence of right artificial hip joint: Secondary | ICD-10-CM | POA: Diagnosis not present

## 2020-06-22 DIAGNOSIS — R197 Diarrhea, unspecified: Secondary | ICD-10-CM | POA: Diagnosis not present

## 2020-06-22 DIAGNOSIS — E871 Hypo-osmolality and hyponatremia: Secondary | ICD-10-CM | POA: Diagnosis not present

## 2020-06-22 DIAGNOSIS — L89319 Pressure ulcer of right buttock, unspecified stage: Secondary | ICD-10-CM | POA: Diagnosis not present

## 2020-06-22 DIAGNOSIS — R61 Generalized hyperhidrosis: Secondary | ICD-10-CM | POA: Diagnosis not present

## 2020-06-22 DIAGNOSIS — Z7901 Long term (current) use of anticoagulants: Secondary | ICD-10-CM | POA: Diagnosis not present

## 2020-06-22 DIAGNOSIS — N179 Acute kidney failure, unspecified: Secondary | ICD-10-CM | POA: Diagnosis not present

## 2020-07-03 DIAGNOSIS — R197 Diarrhea, unspecified: Secondary | ICD-10-CM | POA: Diagnosis not present

## 2020-07-07 DIAGNOSIS — S72001D Fracture of unspecified part of neck of right femur, subsequent encounter for closed fracture with routine healing: Secondary | ICD-10-CM | POA: Diagnosis not present

## 2020-07-14 DIAGNOSIS — J449 Chronic obstructive pulmonary disease, unspecified: Secondary | ICD-10-CM | POA: Diagnosis not present

## 2020-07-14 DIAGNOSIS — E039 Hypothyroidism, unspecified: Secondary | ICD-10-CM | POA: Diagnosis not present

## 2020-07-14 DIAGNOSIS — Z471 Aftercare following joint replacement surgery: Secondary | ICD-10-CM | POA: Diagnosis not present

## 2020-07-14 DIAGNOSIS — D509 Iron deficiency anemia, unspecified: Secondary | ICD-10-CM | POA: Diagnosis not present

## 2020-07-14 DIAGNOSIS — M1990 Unspecified osteoarthritis, unspecified site: Secondary | ICD-10-CM | POA: Diagnosis not present

## 2020-07-14 DIAGNOSIS — M6281 Muscle weakness (generalized): Secondary | ICD-10-CM | POA: Diagnosis not present

## 2020-07-14 DIAGNOSIS — I10 Essential (primary) hypertension: Secondary | ICD-10-CM | POA: Diagnosis not present

## 2020-07-14 DIAGNOSIS — Z96641 Presence of right artificial hip joint: Secondary | ICD-10-CM | POA: Diagnosis not present

## 2020-07-14 DIAGNOSIS — F0280 Dementia in other diseases classified elsewhere without behavioral disturbance: Secondary | ICD-10-CM | POA: Diagnosis not present

## 2020-07-14 DIAGNOSIS — L89312 Pressure ulcer of right buttock, stage 2: Secondary | ICD-10-CM | POA: Diagnosis not present

## 2020-07-14 DIAGNOSIS — G9341 Metabolic encephalopathy: Secondary | ICD-10-CM | POA: Diagnosis not present

## 2020-07-14 DIAGNOSIS — S72001D Fracture of unspecified part of neck of right femur, subsequent encounter for closed fracture with routine healing: Secondary | ICD-10-CM | POA: Diagnosis not present

## 2020-07-14 DIAGNOSIS — I440 Atrioventricular block, first degree: Secondary | ICD-10-CM | POA: Diagnosis not present

## 2020-07-14 DIAGNOSIS — G629 Polyneuropathy, unspecified: Secondary | ICD-10-CM | POA: Diagnosis not present

## 2020-07-14 DIAGNOSIS — L89152 Pressure ulcer of sacral region, stage 2: Secondary | ICD-10-CM | POA: Diagnosis not present

## 2020-07-16 DIAGNOSIS — L89152 Pressure ulcer of sacral region, stage 2: Secondary | ICD-10-CM | POA: Diagnosis not present

## 2020-07-16 DIAGNOSIS — S72001A Fracture of unspecified part of neck of right femur, initial encounter for closed fracture: Secondary | ICD-10-CM | POA: Diagnosis not present

## 2020-07-16 DIAGNOSIS — Z6826 Body mass index (BMI) 26.0-26.9, adult: Secondary | ICD-10-CM | POA: Diagnosis not present

## 2020-07-24 DIAGNOSIS — Z887 Allergy status to serum and vaccine status: Secondary | ICD-10-CM | POA: Diagnosis not present

## 2020-07-24 DIAGNOSIS — Z87891 Personal history of nicotine dependence: Secondary | ICD-10-CM | POA: Diagnosis not present

## 2020-07-24 DIAGNOSIS — R1111 Vomiting without nausea: Secondary | ICD-10-CM | POA: Diagnosis not present

## 2020-07-24 DIAGNOSIS — F419 Anxiety disorder, unspecified: Secondary | ICD-10-CM | POA: Diagnosis not present

## 2020-07-24 DIAGNOSIS — K5641 Fecal impaction: Secondary | ICD-10-CM | POA: Diagnosis not present

## 2020-07-24 DIAGNOSIS — E079 Disorder of thyroid, unspecified: Secondary | ICD-10-CM | POA: Diagnosis not present

## 2020-07-24 DIAGNOSIS — R231 Pallor: Secondary | ICD-10-CM | POA: Diagnosis not present

## 2020-07-24 DIAGNOSIS — R61 Generalized hyperhidrosis: Secondary | ICD-10-CM | POA: Diagnosis not present

## 2020-07-24 DIAGNOSIS — R5381 Other malaise: Secondary | ICD-10-CM | POA: Diagnosis not present

## 2020-07-24 DIAGNOSIS — I700 Atherosclerosis of aorta: Secondary | ICD-10-CM | POA: Diagnosis not present

## 2020-07-24 DIAGNOSIS — R112 Nausea with vomiting, unspecified: Secondary | ICD-10-CM | POA: Diagnosis not present

## 2020-07-28 DIAGNOSIS — T17908A Unspecified foreign body in respiratory tract, part unspecified causing other injury, initial encounter: Secondary | ICD-10-CM | POA: Diagnosis not present

## 2020-08-09 ENCOUNTER — Encounter (INDEPENDENT_AMBULATORY_CARE_PROVIDER_SITE_OTHER): Payer: Self-pay | Admitting: *Deleted

## 2020-08-18 DIAGNOSIS — S72001D Fracture of unspecified part of neck of right femur, subsequent encounter for closed fracture with routine healing: Secondary | ICD-10-CM | POA: Diagnosis not present

## 2020-09-13 ENCOUNTER — Ambulatory Visit: Payer: PPO | Admitting: Urology

## 2020-09-15 DIAGNOSIS — G301 Alzheimer's disease with late onset: Secondary | ICD-10-CM | POA: Diagnosis not present

## 2020-09-15 DIAGNOSIS — E782 Mixed hyperlipidemia: Secondary | ICD-10-CM | POA: Diagnosis not present

## 2020-09-15 DIAGNOSIS — E039 Hypothyroidism, unspecified: Secondary | ICD-10-CM | POA: Diagnosis not present

## 2020-09-15 DIAGNOSIS — E876 Hypokalemia: Secondary | ICD-10-CM | POA: Diagnosis not present

## 2020-09-22 DIAGNOSIS — N401 Enlarged prostate with lower urinary tract symptoms: Secondary | ICD-10-CM | POA: Diagnosis not present

## 2020-09-22 DIAGNOSIS — I10 Essential (primary) hypertension: Secondary | ICD-10-CM | POA: Diagnosis not present

## 2020-09-22 DIAGNOSIS — E871 Hypo-osmolality and hyponatremia: Secondary | ICD-10-CM | POA: Diagnosis not present

## 2020-09-22 DIAGNOSIS — Z23 Encounter for immunization: Secondary | ICD-10-CM | POA: Diagnosis not present

## 2020-09-22 DIAGNOSIS — G301 Alzheimer's disease with late onset: Secondary | ICD-10-CM | POA: Diagnosis not present

## 2020-09-22 DIAGNOSIS — F331 Major depressive disorder, recurrent, moderate: Secondary | ICD-10-CM | POA: Diagnosis not present

## 2020-09-22 DIAGNOSIS — D500 Iron deficiency anemia secondary to blood loss (chronic): Secondary | ICD-10-CM | POA: Diagnosis not present

## 2020-09-22 DIAGNOSIS — Z0001 Encounter for general adult medical examination with abnormal findings: Secondary | ICD-10-CM | POA: Diagnosis not present

## 2020-09-25 ENCOUNTER — Ambulatory Visit (INDEPENDENT_AMBULATORY_CARE_PROVIDER_SITE_OTHER): Payer: PPO | Admitting: Gastroenterology

## 2020-11-04 DIAGNOSIS — D0462 Carcinoma in situ of skin of left upper limb, including shoulder: Secondary | ICD-10-CM | POA: Diagnosis not present

## 2020-11-04 DIAGNOSIS — C44621 Squamous cell carcinoma of skin of unspecified upper limb, including shoulder: Secondary | ICD-10-CM | POA: Diagnosis not present

## 2020-11-04 DIAGNOSIS — D485 Neoplasm of uncertain behavior of skin: Secondary | ICD-10-CM | POA: Diagnosis not present

## 2020-11-24 DIAGNOSIS — R1312 Dysphagia, oropharyngeal phase: Secondary | ICD-10-CM | POA: Diagnosis not present

## 2020-11-24 DIAGNOSIS — J189 Pneumonia, unspecified organism: Secondary | ICD-10-CM | POA: Diagnosis not present

## 2020-11-24 DIAGNOSIS — J690 Pneumonitis due to inhalation of food and vomit: Secondary | ICD-10-CM | POA: Diagnosis not present

## 2020-11-24 DIAGNOSIS — J9601 Acute respiratory failure with hypoxia: Secondary | ICD-10-CM | POA: Diagnosis not present

## 2020-11-24 DIAGNOSIS — Z87891 Personal history of nicotine dependence: Secondary | ICD-10-CM | POA: Diagnosis not present

## 2020-11-24 DIAGNOSIS — J188 Other pneumonia, unspecified organism: Secondary | ICD-10-CM | POA: Diagnosis not present

## 2020-11-24 DIAGNOSIS — M6281 Muscle weakness (generalized): Secondary | ICD-10-CM | POA: Diagnosis not present

## 2020-11-24 DIAGNOSIS — Z7901 Long term (current) use of anticoagulants: Secondary | ICD-10-CM | POA: Diagnosis not present

## 2020-11-24 DIAGNOSIS — Z20822 Contact with and (suspected) exposure to covid-19: Secondary | ICD-10-CM | POA: Diagnosis not present

## 2020-11-24 DIAGNOSIS — R509 Fever, unspecified: Secondary | ICD-10-CM | POA: Diagnosis not present

## 2020-11-24 DIAGNOSIS — D649 Anemia, unspecified: Secondary | ICD-10-CM | POA: Diagnosis not present

## 2020-11-24 DIAGNOSIS — R339 Retention of urine, unspecified: Secondary | ICD-10-CM | POA: Diagnosis not present

## 2020-11-24 DIAGNOSIS — F0281 Dementia in other diseases classified elsewhere with behavioral disturbance: Secondary | ICD-10-CM | POA: Diagnosis not present

## 2020-11-24 DIAGNOSIS — I10 Essential (primary) hypertension: Secondary | ICD-10-CM | POA: Diagnosis not present

## 2020-11-24 DIAGNOSIS — R059 Cough, unspecified: Secondary | ICD-10-CM | POA: Diagnosis not present

## 2020-11-24 DIAGNOSIS — G629 Polyneuropathy, unspecified: Secondary | ICD-10-CM | POA: Diagnosis not present

## 2020-11-24 DIAGNOSIS — D72829 Elevated white blood cell count, unspecified: Secondary | ICD-10-CM | POA: Diagnosis not present

## 2020-11-24 DIAGNOSIS — F0280 Dementia in other diseases classified elsewhere without behavioral disturbance: Secondary | ICD-10-CM | POA: Diagnosis not present

## 2020-11-24 DIAGNOSIS — K219 Gastro-esophageal reflux disease without esophagitis: Secondary | ICD-10-CM | POA: Diagnosis not present

## 2020-11-24 DIAGNOSIS — E063 Autoimmune thyroiditis: Secondary | ICD-10-CM | POA: Diagnosis not present

## 2020-11-24 DIAGNOSIS — N401 Enlarged prostate with lower urinary tract symptoms: Secondary | ICD-10-CM | POA: Diagnosis not present

## 2020-11-24 DIAGNOSIS — R262 Difficulty in walking, not elsewhere classified: Secondary | ICD-10-CM | POA: Diagnosis not present

## 2020-11-24 DIAGNOSIS — F0390 Unspecified dementia without behavioral disturbance: Secondary | ICD-10-CM | POA: Diagnosis not present

## 2020-11-24 DIAGNOSIS — Z66 Do not resuscitate: Secondary | ICD-10-CM | POA: Diagnosis not present

## 2020-11-24 DIAGNOSIS — R531 Weakness: Secondary | ICD-10-CM | POA: Diagnosis not present

## 2020-11-24 DIAGNOSIS — R0902 Hypoxemia: Secondary | ICD-10-CM | POA: Diagnosis not present

## 2020-11-24 DIAGNOSIS — B961 Klebsiella pneumoniae [K. pneumoniae] as the cause of diseases classified elsewhere: Secondary | ICD-10-CM | POA: Diagnosis not present

## 2020-11-24 DIAGNOSIS — R5381 Other malaise: Secondary | ICD-10-CM | POA: Diagnosis not present

## 2020-11-24 DIAGNOSIS — E039 Hypothyroidism, unspecified: Secondary | ICD-10-CM | POA: Diagnosis not present

## 2020-11-24 DIAGNOSIS — N400 Enlarged prostate without lower urinary tract symptoms: Secondary | ICD-10-CM | POA: Diagnosis not present

## 2020-11-24 DIAGNOSIS — F331 Major depressive disorder, recurrent, moderate: Secondary | ICD-10-CM | POA: Diagnosis not present

## 2020-11-24 DIAGNOSIS — J302 Other seasonal allergic rhinitis: Secondary | ICD-10-CM | POA: Diagnosis not present

## 2020-11-24 DIAGNOSIS — N390 Urinary tract infection, site not specified: Secondary | ICD-10-CM | POA: Diagnosis not present

## 2020-11-24 DIAGNOSIS — Z96641 Presence of right artificial hip joint: Secondary | ICD-10-CM | POA: Diagnosis not present

## 2020-11-24 DIAGNOSIS — G309 Alzheimer's disease, unspecified: Secondary | ICD-10-CM | POA: Diagnosis not present

## 2020-12-01 DIAGNOSIS — J188 Other pneumonia, unspecified organism: Secondary | ICD-10-CM | POA: Diagnosis not present

## 2020-12-01 DIAGNOSIS — R1312 Dysphagia, oropharyngeal phase: Secondary | ICD-10-CM | POA: Diagnosis not present

## 2020-12-01 DIAGNOSIS — D649 Anemia, unspecified: Secondary | ICD-10-CM | POA: Diagnosis not present

## 2020-12-01 DIAGNOSIS — N401 Enlarged prostate with lower urinary tract symptoms: Secondary | ICD-10-CM | POA: Diagnosis not present

## 2020-12-01 DIAGNOSIS — J189 Pneumonia, unspecified organism: Secondary | ICD-10-CM | POA: Diagnosis not present

## 2020-12-01 DIAGNOSIS — E039 Hypothyroidism, unspecified: Secondary | ICD-10-CM | POA: Diagnosis not present

## 2020-12-01 DIAGNOSIS — J984 Other disorders of lung: Secondary | ICD-10-CM | POA: Diagnosis not present

## 2020-12-01 DIAGNOSIS — R059 Cough, unspecified: Secondary | ICD-10-CM | POA: Diagnosis not present

## 2020-12-01 DIAGNOSIS — R262 Difficulty in walking, not elsewhere classified: Secondary | ICD-10-CM | POA: Diagnosis not present

## 2020-12-01 DIAGNOSIS — J9601 Acute respiratory failure with hypoxia: Secondary | ICD-10-CM | POA: Diagnosis not present

## 2020-12-01 DIAGNOSIS — E871 Hypo-osmolality and hyponatremia: Secondary | ICD-10-CM | POA: Diagnosis not present

## 2020-12-01 DIAGNOSIS — F331 Major depressive disorder, recurrent, moderate: Secondary | ICD-10-CM | POA: Diagnosis not present

## 2020-12-01 DIAGNOSIS — I10 Essential (primary) hypertension: Secondary | ICD-10-CM | POA: Diagnosis not present

## 2020-12-01 DIAGNOSIS — G629 Polyneuropathy, unspecified: Secondary | ICD-10-CM | POA: Diagnosis not present

## 2020-12-01 DIAGNOSIS — Z96641 Presence of right artificial hip joint: Secondary | ICD-10-CM | POA: Diagnosis not present

## 2020-12-01 DIAGNOSIS — J690 Pneumonitis due to inhalation of food and vomit: Secondary | ICD-10-CM | POA: Diagnosis not present

## 2020-12-01 DIAGNOSIS — F0390 Unspecified dementia without behavioral disturbance: Secondary | ICD-10-CM | POA: Diagnosis not present

## 2020-12-01 DIAGNOSIS — R918 Other nonspecific abnormal finding of lung field: Secondary | ICD-10-CM | POA: Diagnosis not present

## 2020-12-01 DIAGNOSIS — J302 Other seasonal allergic rhinitis: Secondary | ICD-10-CM | POA: Diagnosis not present

## 2020-12-01 DIAGNOSIS — Z5181 Encounter for therapeutic drug level monitoring: Secondary | ICD-10-CM | POA: Diagnosis not present

## 2020-12-01 DIAGNOSIS — F0280 Dementia in other diseases classified elsewhere without behavioral disturbance: Secondary | ICD-10-CM | POA: Diagnosis not present

## 2020-12-01 DIAGNOSIS — R0989 Other specified symptoms and signs involving the circulatory and respiratory systems: Secondary | ICD-10-CM | POA: Diagnosis not present

## 2020-12-01 DIAGNOSIS — K219 Gastro-esophageal reflux disease without esophagitis: Secondary | ICD-10-CM | POA: Diagnosis not present

## 2020-12-01 DIAGNOSIS — Z09 Encounter for follow-up examination after completed treatment for conditions other than malignant neoplasm: Secondary | ICD-10-CM | POA: Diagnosis not present

## 2020-12-01 DIAGNOSIS — R319 Hematuria, unspecified: Secondary | ICD-10-CM | POA: Diagnosis not present

## 2020-12-01 DIAGNOSIS — M6281 Muscle weakness (generalized): Secondary | ICD-10-CM | POA: Diagnosis not present

## 2020-12-01 DIAGNOSIS — N390 Urinary tract infection, site not specified: Secondary | ICD-10-CM | POA: Diagnosis not present

## 2020-12-01 DIAGNOSIS — G309 Alzheimer's disease, unspecified: Secondary | ICD-10-CM | POA: Diagnosis not present

## 2020-12-04 DIAGNOSIS — F331 Major depressive disorder, recurrent, moderate: Secondary | ICD-10-CM | POA: Diagnosis not present

## 2020-12-04 DIAGNOSIS — I10 Essential (primary) hypertension: Secondary | ICD-10-CM | POA: Diagnosis not present

## 2020-12-04 DIAGNOSIS — R319 Hematuria, unspecified: Secondary | ICD-10-CM | POA: Diagnosis not present

## 2020-12-04 DIAGNOSIS — G309 Alzheimer's disease, unspecified: Secondary | ICD-10-CM | POA: Diagnosis not present

## 2020-12-04 DIAGNOSIS — N390 Urinary tract infection, site not specified: Secondary | ICD-10-CM | POA: Diagnosis not present

## 2020-12-04 DIAGNOSIS — G629 Polyneuropathy, unspecified: Secondary | ICD-10-CM | POA: Diagnosis not present

## 2020-12-04 DIAGNOSIS — D649 Anemia, unspecified: Secondary | ICD-10-CM | POA: Diagnosis not present

## 2020-12-04 DIAGNOSIS — J9601 Acute respiratory failure with hypoxia: Secondary | ICD-10-CM | POA: Diagnosis not present

## 2020-12-06 DIAGNOSIS — D649 Anemia, unspecified: Secondary | ICD-10-CM | POA: Diagnosis not present

## 2020-12-11 DIAGNOSIS — R059 Cough, unspecified: Secondary | ICD-10-CM | POA: Diagnosis not present

## 2020-12-11 DIAGNOSIS — R0989 Other specified symptoms and signs involving the circulatory and respiratory systems: Secondary | ICD-10-CM | POA: Diagnosis not present

## 2020-12-11 DIAGNOSIS — E871 Hypo-osmolality and hyponatremia: Secondary | ICD-10-CM | POA: Diagnosis not present

## 2020-12-11 DIAGNOSIS — E039 Hypothyroidism, unspecified: Secondary | ICD-10-CM | POA: Diagnosis not present

## 2020-12-25 DIAGNOSIS — Z96641 Presence of right artificial hip joint: Secondary | ICD-10-CM | POA: Diagnosis not present

## 2020-12-25 DIAGNOSIS — R2681 Unsteadiness on feet: Secondary | ICD-10-CM | POA: Diagnosis not present

## 2020-12-25 DIAGNOSIS — M6281 Muscle weakness (generalized): Secondary | ICD-10-CM | POA: Diagnosis not present

## 2020-12-25 DIAGNOSIS — J690 Pneumonitis due to inhalation of food and vomit: Secondary | ICD-10-CM | POA: Diagnosis not present

## 2020-12-28 DIAGNOSIS — Z96641 Presence of right artificial hip joint: Secondary | ICD-10-CM | POA: Diagnosis not present

## 2020-12-28 DIAGNOSIS — J690 Pneumonitis due to inhalation of food and vomit: Secondary | ICD-10-CM | POA: Diagnosis not present

## 2020-12-28 DIAGNOSIS — M6281 Muscle weakness (generalized): Secondary | ICD-10-CM | POA: Diagnosis not present

## 2020-12-28 DIAGNOSIS — R2681 Unsteadiness on feet: Secondary | ICD-10-CM | POA: Diagnosis not present

## 2020-12-30 DIAGNOSIS — J690 Pneumonitis due to inhalation of food and vomit: Secondary | ICD-10-CM | POA: Diagnosis not present

## 2021-01-01 DIAGNOSIS — M6281 Muscle weakness (generalized): Secondary | ICD-10-CM | POA: Diagnosis not present

## 2021-01-01 DIAGNOSIS — Z96641 Presence of right artificial hip joint: Secondary | ICD-10-CM | POA: Diagnosis not present

## 2021-01-01 DIAGNOSIS — J690 Pneumonitis due to inhalation of food and vomit: Secondary | ICD-10-CM | POA: Diagnosis not present

## 2021-01-01 DIAGNOSIS — R2681 Unsteadiness on feet: Secondary | ICD-10-CM | POA: Diagnosis not present

## 2021-01-04 DIAGNOSIS — Z96641 Presence of right artificial hip joint: Secondary | ICD-10-CM | POA: Diagnosis not present

## 2021-01-04 DIAGNOSIS — M6281 Muscle weakness (generalized): Secondary | ICD-10-CM | POA: Diagnosis not present

## 2021-01-04 DIAGNOSIS — R2681 Unsteadiness on feet: Secondary | ICD-10-CM | POA: Diagnosis not present

## 2021-01-04 DIAGNOSIS — J690 Pneumonitis due to inhalation of food and vomit: Secondary | ICD-10-CM | POA: Diagnosis not present

## 2021-01-08 DIAGNOSIS — Z96641 Presence of right artificial hip joint: Secondary | ICD-10-CM | POA: Diagnosis not present

## 2021-01-08 DIAGNOSIS — R2681 Unsteadiness on feet: Secondary | ICD-10-CM | POA: Diagnosis not present

## 2021-01-08 DIAGNOSIS — J690 Pneumonitis due to inhalation of food and vomit: Secondary | ICD-10-CM | POA: Diagnosis not present

## 2021-01-08 DIAGNOSIS — M6281 Muscle weakness (generalized): Secondary | ICD-10-CM | POA: Diagnosis not present

## 2021-01-11 DIAGNOSIS — J690 Pneumonitis due to inhalation of food and vomit: Secondary | ICD-10-CM | POA: Diagnosis not present

## 2021-01-11 DIAGNOSIS — Z96641 Presence of right artificial hip joint: Secondary | ICD-10-CM | POA: Diagnosis not present

## 2021-01-11 DIAGNOSIS — M6281 Muscle weakness (generalized): Secondary | ICD-10-CM | POA: Diagnosis not present

## 2021-01-11 DIAGNOSIS — R2681 Unsteadiness on feet: Secondary | ICD-10-CM | POA: Diagnosis not present

## 2021-01-12 DIAGNOSIS — I10 Essential (primary) hypertension: Secondary | ICD-10-CM | POA: Diagnosis not present

## 2021-01-12 DIAGNOSIS — F331 Major depressive disorder, recurrent, moderate: Secondary | ICD-10-CM | POA: Diagnosis not present

## 2021-01-12 DIAGNOSIS — E7849 Other hyperlipidemia: Secondary | ICD-10-CM | POA: Diagnosis not present

## 2021-01-12 DIAGNOSIS — E039 Hypothyroidism, unspecified: Secondary | ICD-10-CM | POA: Diagnosis not present

## 2021-01-12 DIAGNOSIS — G301 Alzheimer's disease with late onset: Secondary | ICD-10-CM | POA: Diagnosis not present

## 2021-01-12 DIAGNOSIS — Z0001 Encounter for general adult medical examination with abnormal findings: Secondary | ICD-10-CM | POA: Diagnosis not present

## 2021-01-12 DIAGNOSIS — J441 Chronic obstructive pulmonary disease with (acute) exacerbation: Secondary | ICD-10-CM | POA: Diagnosis not present

## 2021-01-12 DIAGNOSIS — R946 Abnormal results of thyroid function studies: Secondary | ICD-10-CM | POA: Diagnosis not present

## 2021-01-12 DIAGNOSIS — N179 Acute kidney failure, unspecified: Secondary | ICD-10-CM | POA: Diagnosis not present

## 2021-01-12 DIAGNOSIS — K2100 Gastro-esophageal reflux disease with esophagitis, without bleeding: Secondary | ICD-10-CM | POA: Diagnosis not present

## 2021-01-12 DIAGNOSIS — E782 Mixed hyperlipidemia: Secondary | ICD-10-CM | POA: Diagnosis not present

## 2021-01-15 DIAGNOSIS — M6281 Muscle weakness (generalized): Secondary | ICD-10-CM | POA: Diagnosis not present

## 2021-01-15 DIAGNOSIS — Z96641 Presence of right artificial hip joint: Secondary | ICD-10-CM | POA: Diagnosis not present

## 2021-01-15 DIAGNOSIS — R2681 Unsteadiness on feet: Secondary | ICD-10-CM | POA: Diagnosis not present

## 2021-01-15 DIAGNOSIS — J690 Pneumonitis due to inhalation of food and vomit: Secondary | ICD-10-CM | POA: Diagnosis not present

## 2021-01-18 DIAGNOSIS — J690 Pneumonitis due to inhalation of food and vomit: Secondary | ICD-10-CM | POA: Diagnosis not present

## 2021-01-18 DIAGNOSIS — Z96641 Presence of right artificial hip joint: Secondary | ICD-10-CM | POA: Diagnosis not present

## 2021-01-18 DIAGNOSIS — M6281 Muscle weakness (generalized): Secondary | ICD-10-CM | POA: Diagnosis not present

## 2021-01-18 DIAGNOSIS — R2681 Unsteadiness on feet: Secondary | ICD-10-CM | POA: Diagnosis not present

## 2021-01-19 DIAGNOSIS — E871 Hypo-osmolality and hyponatremia: Secondary | ICD-10-CM | POA: Diagnosis not present

## 2021-01-19 DIAGNOSIS — G301 Alzheimer's disease with late onset: Secondary | ICD-10-CM | POA: Diagnosis not present

## 2021-01-19 DIAGNOSIS — N401 Enlarged prostate with lower urinary tract symptoms: Secondary | ICD-10-CM | POA: Diagnosis not present

## 2021-01-19 DIAGNOSIS — F331 Major depressive disorder, recurrent, moderate: Secondary | ICD-10-CM | POA: Diagnosis not present

## 2021-01-19 DIAGNOSIS — D500 Iron deficiency anemia secondary to blood loss (chronic): Secondary | ICD-10-CM | POA: Diagnosis not present

## 2021-01-19 DIAGNOSIS — E039 Hypothyroidism, unspecified: Secondary | ICD-10-CM | POA: Diagnosis not present

## 2021-01-19 DIAGNOSIS — I10 Essential (primary) hypertension: Secondary | ICD-10-CM | POA: Diagnosis not present

## 2021-01-19 DIAGNOSIS — K2100 Gastro-esophageal reflux disease with esophagitis, without bleeding: Secondary | ICD-10-CM | POA: Diagnosis not present

## 2021-01-20 DIAGNOSIS — J690 Pneumonitis due to inhalation of food and vomit: Secondary | ICD-10-CM | POA: Diagnosis not present

## 2021-01-22 DIAGNOSIS — J690 Pneumonitis due to inhalation of food and vomit: Secondary | ICD-10-CM | POA: Diagnosis not present

## 2021-01-22 DIAGNOSIS — Z96641 Presence of right artificial hip joint: Secondary | ICD-10-CM | POA: Diagnosis not present

## 2021-01-22 DIAGNOSIS — M6281 Muscle weakness (generalized): Secondary | ICD-10-CM | POA: Diagnosis not present

## 2021-01-22 DIAGNOSIS — R2681 Unsteadiness on feet: Secondary | ICD-10-CM | POA: Diagnosis not present

## 2021-01-25 DIAGNOSIS — I10 Essential (primary) hypertension: Secondary | ICD-10-CM | POA: Diagnosis not present

## 2021-01-25 DIAGNOSIS — E875 Hyperkalemia: Secondary | ICD-10-CM | POA: Diagnosis not present

## 2021-01-25 DIAGNOSIS — R300 Dysuria: Secondary | ICD-10-CM | POA: Diagnosis not present

## 2021-01-30 DIAGNOSIS — Z96641 Presence of right artificial hip joint: Secondary | ICD-10-CM | POA: Diagnosis not present

## 2021-01-30 DIAGNOSIS — R2681 Unsteadiness on feet: Secondary | ICD-10-CM | POA: Diagnosis not present

## 2021-01-30 DIAGNOSIS — M6281 Muscle weakness (generalized): Secondary | ICD-10-CM | POA: Diagnosis not present

## 2021-01-30 DIAGNOSIS — J690 Pneumonitis due to inhalation of food and vomit: Secondary | ICD-10-CM | POA: Diagnosis not present

## 2021-02-01 DIAGNOSIS — J690 Pneumonitis due to inhalation of food and vomit: Secondary | ICD-10-CM | POA: Diagnosis not present

## 2021-02-01 DIAGNOSIS — Z96641 Presence of right artificial hip joint: Secondary | ICD-10-CM | POA: Diagnosis not present

## 2021-02-01 DIAGNOSIS — M6281 Muscle weakness (generalized): Secondary | ICD-10-CM | POA: Diagnosis not present

## 2021-02-01 DIAGNOSIS — R2681 Unsteadiness on feet: Secondary | ICD-10-CM | POA: Diagnosis not present

## 2021-02-06 DIAGNOSIS — R2681 Unsteadiness on feet: Secondary | ICD-10-CM | POA: Diagnosis not present

## 2021-02-06 DIAGNOSIS — M6281 Muscle weakness (generalized): Secondary | ICD-10-CM | POA: Diagnosis not present

## 2021-02-06 DIAGNOSIS — J690 Pneumonitis due to inhalation of food and vomit: Secondary | ICD-10-CM | POA: Diagnosis not present

## 2021-02-06 DIAGNOSIS — Z96641 Presence of right artificial hip joint: Secondary | ICD-10-CM | POA: Diagnosis not present

## 2021-02-09 DIAGNOSIS — R270 Ataxia, unspecified: Secondary | ICD-10-CM | POA: Diagnosis not present

## 2021-02-09 DIAGNOSIS — R300 Dysuria: Secondary | ICD-10-CM | POA: Diagnosis not present

## 2021-02-09 DIAGNOSIS — Z6828 Body mass index (BMI) 28.0-28.9, adult: Secondary | ICD-10-CM | POA: Diagnosis not present

## 2021-02-12 DIAGNOSIS — Z79890 Hormone replacement therapy (postmenopausal): Secondary | ICD-10-CM | POA: Diagnosis not present

## 2021-02-12 DIAGNOSIS — E871 Hypo-osmolality and hyponatremia: Secondary | ICD-10-CM | POA: Diagnosis not present

## 2021-02-12 DIAGNOSIS — F0280 Dementia in other diseases classified elsewhere without behavioral disturbance: Secondary | ICD-10-CM | POA: Diagnosis not present

## 2021-02-12 DIAGNOSIS — D649 Anemia, unspecified: Secondary | ICD-10-CM | POA: Diagnosis not present

## 2021-02-12 DIAGNOSIS — I10 Essential (primary) hypertension: Secondary | ICD-10-CM | POA: Diagnosis not present

## 2021-02-12 DIAGNOSIS — Z20822 Contact with and (suspected) exposure to covid-19: Secondary | ICD-10-CM | POA: Diagnosis not present

## 2021-02-12 DIAGNOSIS — F331 Major depressive disorder, recurrent, moderate: Secondary | ICD-10-CM | POA: Diagnosis not present

## 2021-02-12 DIAGNOSIS — R531 Weakness: Secondary | ICD-10-CM | POA: Diagnosis not present

## 2021-02-12 DIAGNOSIS — J690 Pneumonitis due to inhalation of food and vomit: Secondary | ICD-10-CM | POA: Diagnosis not present

## 2021-02-12 DIAGNOSIS — Z87891 Personal history of nicotine dependence: Secondary | ICD-10-CM | POA: Diagnosis not present

## 2021-02-12 DIAGNOSIS — G309 Alzheimer's disease, unspecified: Secondary | ICD-10-CM | POA: Diagnosis not present

## 2021-02-12 DIAGNOSIS — E039 Hypothyroidism, unspecified: Secondary | ICD-10-CM | POA: Diagnosis not present

## 2021-02-12 DIAGNOSIS — J189 Pneumonia, unspecified organism: Secondary | ICD-10-CM | POA: Diagnosis not present

## 2021-02-12 DIAGNOSIS — Z66 Do not resuscitate: Secondary | ICD-10-CM | POA: Diagnosis not present

## 2021-02-12 DIAGNOSIS — F0281 Dementia in other diseases classified elsewhere with behavioral disturbance: Secondary | ICD-10-CM | POA: Diagnosis not present

## 2021-02-12 DIAGNOSIS — N401 Enlarged prostate with lower urinary tract symptoms: Secondary | ICD-10-CM | POA: Diagnosis not present

## 2021-02-12 DIAGNOSIS — R3914 Feeling of incomplete bladder emptying: Secondary | ICD-10-CM | POA: Diagnosis not present

## 2021-02-12 DIAGNOSIS — R001 Bradycardia, unspecified: Secondary | ICD-10-CM | POA: Diagnosis not present

## 2021-02-12 DIAGNOSIS — R0902 Hypoxemia: Secondary | ICD-10-CM | POA: Diagnosis not present

## 2021-02-12 DIAGNOSIS — K219 Gastro-esophageal reflux disease without esophagitis: Secondary | ICD-10-CM | POA: Diagnosis not present

## 2021-02-12 DIAGNOSIS — E063 Autoimmune thyroiditis: Secondary | ICD-10-CM | POA: Diagnosis not present

## 2021-02-12 DIAGNOSIS — R5381 Other malaise: Secondary | ICD-10-CM | POA: Diagnosis not present

## 2021-02-20 DIAGNOSIS — J690 Pneumonitis due to inhalation of food and vomit: Secondary | ICD-10-CM | POA: Diagnosis not present

## 2021-02-20 DIAGNOSIS — E871 Hypo-osmolality and hyponatremia: Secondary | ICD-10-CM | POA: Diagnosis not present

## 2021-02-20 DIAGNOSIS — R059 Cough, unspecified: Secondary | ICD-10-CM | POA: Diagnosis not present

## 2021-03-01 DIAGNOSIS — D500 Iron deficiency anemia secondary to blood loss (chronic): Secondary | ICD-10-CM | POA: Diagnosis not present

## 2021-03-01 DIAGNOSIS — E876 Hypokalemia: Secondary | ICD-10-CM | POA: Diagnosis not present

## 2021-03-01 DIAGNOSIS — E871 Hypo-osmolality and hyponatremia: Secondary | ICD-10-CM | POA: Diagnosis not present

## 2021-03-01 DIAGNOSIS — E875 Hyperkalemia: Secondary | ICD-10-CM | POA: Diagnosis not present

## 2021-03-01 DIAGNOSIS — D649 Anemia, unspecified: Secondary | ICD-10-CM | POA: Diagnosis not present

## 2021-03-01 DIAGNOSIS — E782 Mixed hyperlipidemia: Secondary | ICD-10-CM | POA: Diagnosis not present

## 2021-03-01 DIAGNOSIS — D519 Vitamin B12 deficiency anemia, unspecified: Secondary | ICD-10-CM | POA: Diagnosis not present

## 2021-03-01 DIAGNOSIS — E039 Hypothyroidism, unspecified: Secondary | ICD-10-CM | POA: Diagnosis not present

## 2021-03-08 DIAGNOSIS — G629 Polyneuropathy, unspecified: Secondary | ICD-10-CM | POA: Diagnosis not present

## 2021-03-08 DIAGNOSIS — R531 Weakness: Secondary | ICD-10-CM | POA: Diagnosis not present

## 2021-03-16 DIAGNOSIS — R531 Weakness: Secondary | ICD-10-CM | POA: Diagnosis not present

## 2021-03-16 DIAGNOSIS — G309 Alzheimer's disease, unspecified: Secondary | ICD-10-CM | POA: Diagnosis not present

## 2021-03-16 DIAGNOSIS — F0281 Dementia in other diseases classified elsewhere with behavioral disturbance: Secondary | ICD-10-CM | POA: Diagnosis not present

## 2021-03-16 DIAGNOSIS — G629 Polyneuropathy, unspecified: Secondary | ICD-10-CM | POA: Diagnosis not present

## 2021-03-16 DIAGNOSIS — Z8701 Personal history of pneumonia (recurrent): Secondary | ICD-10-CM | POA: Diagnosis not present

## 2021-03-18 DIAGNOSIS — E871 Hypo-osmolality and hyponatremia: Secondary | ICD-10-CM | POA: Diagnosis not present

## 2021-03-18 DIAGNOSIS — J690 Pneumonitis due to inhalation of food and vomit: Secondary | ICD-10-CM | POA: Diagnosis not present

## 2021-03-18 DIAGNOSIS — R059 Cough, unspecified: Secondary | ICD-10-CM | POA: Diagnosis not present

## 2021-03-26 ENCOUNTER — Institutional Professional Consult (permissible substitution): Payer: PPO | Admitting: Pulmonary Disease

## 2021-04-03 ENCOUNTER — Other Ambulatory Visit: Payer: Self-pay

## 2021-04-03 ENCOUNTER — Ambulatory Visit: Payer: PPO | Admitting: Internal Medicine

## 2021-04-03 ENCOUNTER — Encounter: Payer: Self-pay | Admitting: Internal Medicine

## 2021-04-03 DIAGNOSIS — R1312 Dysphagia, oropharyngeal phase: Secondary | ICD-10-CM | POA: Insufficient documentation

## 2021-04-03 DIAGNOSIS — J449 Chronic obstructive pulmonary disease, unspecified: Secondary | ICD-10-CM | POA: Diagnosis not present

## 2021-04-03 MED ORDER — BREZTRI AEROSPHERE 160-9-4.8 MCG/ACT IN AERO: 2.0000 | INHALATION_SPRAY | Freq: Two times a day (BID) | RESPIRATORY_TRACT | 0 refills | Status: AC

## 2021-04-03 MED ORDER — OMEPRAZOLE 40 MG PO CPDR: DELAYED_RELEASE_CAPSULE | ORAL | 2 refills | Status: AC

## 2021-04-03 MED ORDER — BREZTRI AEROSPHERE 160-9-4.8 MCG/ACT IN AERO: INHALATION_SPRAY | RESPIRATORY_TRACT | 11 refills | Status: AC

## 2021-04-03 NOTE — Progress Notes (Signed)
Christian Cella Sr., male    DOB: 03-15-1935,   MRN: [id number]   Brief patient profile:  86 yowm quit smoking 1992 did fine x for occ rhinitis ? Allergic then fell and was good health x for stability issues until broke R Hip Aug 2021 > [facility] ? C/b ? Asp pneumonia but did have subsequent swallowing ng eval in [place]  dx "es dysmotility"  rx with dietary and main issue is swallowing meds with recurrent pna since most recent May 2022 admit [facility]    Admit date: 02/12/2021 Discharge date: 02/16/2021   Discharge Diagnoses  Principal Problem: Pneumonia due to infectious organism Active Problems: Alzheimer's disease with behavioral disturbance (CMS-HCC) Hypothyroidism due to Hashimoto's thyroiditis Primary hypertension Gastroesophageal reflux disease without esophagitis Depression, major, recurrent, moderate (CMS-HCC) Benign prostatic hyperplasia with incomplete bladder emptying Aspiration pneumonia (CMS-HCC) Hyponatremia   Hospital Course  This 86 year old white male with multiple medical problems was admitted after developing weakness inability to get out of bed and cough. Because these factors he was brought to the emergency room by EMS where he was found to have left lower lobe infiltrate consistent with pneumonia. After talking with his wife it sounded like he may have aspirated again. He has had several episodes of pneumonia. Because of these factors and the fact that he had hyponatremia with a sodium of 126 he required more than 2 midnights in the hospital. He received IV Rocephin Zithromax and normal saline. Appetite is improved. His sodium has normalized. He was evaluated by speech therapy with a modified barium swallow study which showed no evidence of gross aspiration and thus the speech therapist made some recommendations on feeding and swallowing. We thought we would be able to get him into the nursing home for rehab but the insurance denied him as he is going home with his wife  today with physical therapy and speech therapy at home. I talked to his wife about the fact that we will see him back in the office most likely make ENT and possible pulmonology referrals.  He had mild anemia during his hospitalization. This is starting to get back to normal and was probably related to his illness.  His other medical problems are stable and we will continue his medications. His prognosis is poor to fair at best. We will call from the office to make a follow-up appointment. I will try to talk to his wife today.  Discharge Medications  STOP taking these medications  divalproex 125 MG DR tablet Commonly known as: DEPAKOTE   CHANGE how you take these medications  amLODIPine 5 MG tablet Commonly known as: NORVASC Take 1 tablet (5 mg total) by mouth daily.   donepeziL 5 MG tablet Commonly known as: ARICEPT Take 1 tablet (5 mg total) by mouth nightly. What changed: when to take this   CONTINUE taking these medications  alfuzosin 10 mg 24 hr tablet Commonly known as: UROXATRAL Take 10 mg by mouth daily.  gabapentin 600 MG tablet Commonly known as: NEURONTIN Take 600 mg by mouth Three (3) times a day.  levothyroxine 75 MCG tablet Commonly known as: SYNTHROID Take 75 mcg by mouth daily.  memantine 5 MG tablet Commonly known as: NAMENDA Take 5 mg by mouth two (2) times a day.  OLANZapine 15 MG tablet Commonly known as: ZyPREXA Take 1 tablet (15 mg total) by mouth nightly.  omeprazole 20 MG capsule Commonly known as: PriLOSEC Take 20 mg by mouth two (2) times a day.  polyethylene glycol 17 gram/dose powder Commonly known as: GLYCOLAX Take 17 g by mouth daily.  sertraline 100 MG tablet Commonly known as: ZOLOFT Take 100 mg by mouth two (2) times a day.  traZODone 100 MG tablet Commonly known as: DESYREL Take 1 tablet (100 mg total) by mouth nightly.      History of Present Illness  04/03/2021  Pulmonary/ 1st office eval/ [facility] Miller / [facility] Office   Chief Complaint  Patient presents with   Pulmonary Consult    Referred by Dr. Gar Ponto. Pt c/o cough since had PNA in August 2021. He has had PNA again since then, possible aspiration. He states that his cough is prod with stringy, clear sputum and gets worse when lies down or after eating.   Dyspnea:  limited by balance  > breathing  Cough: all day but esp worse after meals and hs assoc with noct wheeze minimal productive thick mucus no change on trelegy p 1 month/ not coughing up any food at present but still struggling with swallowing esp pills  Sleep: electric bed x 20 degrees  SABA use: none   No obvious other patterns day to day or daytime variability or assoc excess/ purulent sputum or mucus plugs or hemoptysis or cp or chest tightness,  or overt sinus symptoms.    Also denies any obvious fluctuation of symptoms with weather or environmental changes or other aggravating or alleviating factors except as outlined above   No unusual exposure hx or h/o childhood pna/ asthma or knowledge of premature birth.  Current Allergies, Complete Past Medical History, Past Surgical History, Family History, and Social History were reviewed in [employer] record.  ROS  The following are not active complaints unless bolded Hoarseness, sore throat, dysphagia, dental problems, itching, sneezing,  nasal congestion or discharge of excess mucus or purulent secretions, ear ache,   fever, chills, sweats, unintended wt loss or wt gain, classically pleuritic or exertional cp,  orthopnea pnd or arm/hand swelling  or leg swelling, presyncope, palpitations, abdominal pain, anorexia, nausea, vomiting, diarrhea  or change in bowel habits or change in bladder habits, change in stools or change in urine, dysuria, hematuria,  rash, arthralgias, visual complaints, headache, numbness, weakness or ataxia or problems with walking or coordination,  change in mood or  memory.           Past Medical  History:  Diagnosis Date   Anxiety    Arthritis    BPH (benign prostatic hyperplasia)    COPD (chronic obstructive pulmonary disease) (HCC)    Depression    GERD (gastroesophageal reflux disease)    HOH (hard of hearing)    Hypertension    PONV (postoperative nausea and vomiting)     Outpatient Medications Prior to Visit  Medication Sig Dispense Refill   acetaminophen (TYLENOL) 325 MG tablet Take 2 tablets (650 mg total) by mouth 3 (three) times daily.     alfuzosin (UROXATRAL) 10 MG 24 hr tablet Take 1 tablet (10 mg total) by mouth at bedtime. (Patient taking differently: Take 10 mg by mouth in the morning and at bedtime.) 90 tablet 3   amLODipine (NORVASC) 2.5 MG tablet Take 2.5 mg by mouth daily.     aspirin EC 81 MG tablet Take 81 mg by mouth every Monday, Wednesday, and Friday. At night.      Cholecalciferol (VITAMIN D3) 50 MCG (2000 UT) TABS Take 2,000 Units by mouth daily.     donepezil (ARICEPT) 5 MG tablet Take  5 mg by mouth every morning.      ferrous sulfate 325 (65 FE) MG tablet Take 1 tablet (325 mg total) by mouth 3 (three) times daily after meals.  3   fluticasone (FLONASE) 50 MCG/ACT nasal spray Place 2 sprays into both nostrils daily.     Fluticasone-Umeclidin-Vilant (TRELEGY ELLIPTA) 200-62.5-25 MCG/INH AEPB Inhale 1 puff into the lungs daily.     folic acid (FOLVITE) 295 MCG tablet Take 1 tablet by mouth at bedtime.      gabapentin (NEURONTIN) 600 MG tablet Take 600 mg by mouth at bedtime.     hydrOXYzine (ATARAX/VISTARIL) 10 MG tablet Take 20 mg by mouth at bedtime.     levothyroxine (SYNTHROID) 100 MCG tablet Take 100 mcg by mouth daily before breakfast.     memantine (NAMENDA) 5 MG tablet Take 5 mg by mouth 2 (two) times daily.     montelukast (SINGULAIR) 10 MG tablet Take 10 mg by mouth at bedtime.     Multiple Vitamins-Minerals (MULTIVITAMINS THER. W/MINERALS) TABS Take 1 tablet by mouth daily.       nystatin cream (MYCOSTATIN) Apply 1 application topically 2  (two) times daily.      OLANZapine (ZYPREXA) 15 MG tablet Take 15 mg by mouth at bedtime.     Omega-3 Fatty Acids (FISH OIL) 1000 MG CAPS Take 1,000 mg by mouth every other day.      omeprazole (PRILOSEC) 20 MG capsule Take 20 mg by mouth 2 (two) times daily before a meal.      polyethylene glycol powder (GLYCOLAX/MIRALAX) 17 GM/SCOOP powder Take 17 g by mouth daily.     sertraline (ZOLOFT) 100 MG tablet Take 100 mg by mouth in the morning and at bedtime.      traZODone (DESYREL) 50 MG tablet Take 100 mg by mouth at bedtime.     divalproex (DEPAKOTE) 125 MG DR tablet Take 125 mg by mouth at bedtime.     enoxaparin (LOVENOX) 40 MG/0.4ML injection Inject 0.4 mLs (40 mg total) into the skin daily for 21 days. 0.4 mL 0   levothyroxine (SYNTHROID) 75 MCG tablet Take 75 mcg by mouth every morning.     ondansetron (ZOFRAN) 4 MG tablet Take 1 tablet (4 mg total) by mouth every 6 (six) hours as needed for nausea. 20 tablet 0   saccharomyces boulardii (FLORASTOR) 250 MG capsule Take 1 capsule (250 mg total) by mouth 2 (two) times daily. 60 capsule 0   Facility-Administered Medications Prior to Visit  Medication Dose Route Frequency Provider Last Rate Last Admin   mitoMYcin (MUTAMYCIN) chemo injection 5 mg  5 mg Bladder Instillation Once McKenzie, Candee Furbish, MD         Objective:     BP 138/66 (BP Location: Left Arm, Cuff Size: Normal)   Pulse (!) 58   Temp (!) 97.3 F (36.3 C) (Temporal)   Ht 6' (1.829 m)   Wt 200 lb (90.7 kg)   SpO2 96% Comment: on RA  BMI 27.12 kg/m   SpO2: 96 % (on RA)  Stoic amb wm nad walks with walker very unsteady, shuffling gate  Edentulous with 4 implants   HEENT : pt wearing mask not removed for exam due to covid -19 concerns.    NECK :  without JVD/Nodes/TM/ nl carotid upstrokes bilaterally   LUNGS: no acc muscle use,  Nl contour chest  with a few insp pops/ squeaks and exp rhonchi bilaterally without cough on insp or exp maneuvers  CV:  RRR  no s3 or  murmur or increase in P2, and no edema   ABD:  soft and nontender with nl inspiratory excursion in the supine position. No bruits or organomegaly appreciated, bowel sounds nl  MS:  Nl gait/ ext warm without deformities, calf tenderness, cyanosis or clubbing No obvious joint restrictions   SKIN: warm and dry without lesions    NEURO:  alert  with  no motor or cerebellar deficits apparent.     "Cxr was clear "< one month prior to OV  with same symptoms     Assessment   COPD GOLD ?  /  Quit smoking 1992 - 04/03/2021  After extensive coaching inhaler device,  effectiveness =    75% > try breztri Take 2 puffs first thing in am and then another 2 puffs about 12 hours later.    When respiratory symptoms begin or become refractory well after a patient reports complete smoking cessation,  Especially when this wasn't the case while they were smoking, a red flag is raised based on the work of Dr Kris Mouton which states:  if you quit smoking when your best day FEV1 is still well preserved it is highly unlikely you will progress to severe disease.  That is to say, once the smoking stops,  the symptoms should not suddenly erupt or markedly worsen.  If so, the differential diagnosis should include  obesity/deconditioning,  LPR/Reflux/Aspiration syndromes,  occult CHF, or  especially side effect of medications commonly used in this population.     Nevertheless he may have mild AB component aggravated by LPR or aspiration so worth replacing trelegy with breztri as my experience has been that upper airway wheeze/cough sometimes gets worse with the former though I explained to pt / wife also see this with breztri and if not better p samples then no need to fill the rx for breztri.    Dysphagia, oropharyngeal phase with recurrent aspiration pna Onset apparently after admit for falling/ fx R Hip and worsening since with only dysmotility on ST eval  - ST eval May 2022 at [facility]     Unfortunate combination of es dysmotility and dysphagia in oropharyngeal phase ? Related to cognitive decline or prior cva but nothing to offer from pulmonary perspective other than to be sure he's on max gerd diet/ meds and avoids fish oil or any oil based vitamin to avoid lipoid pna.  Would hold abx in absence of convincing evidence of bacterial pna as most of what he's likely to have will be a bland chemical pneumonitis with recurrent asp inevitable here as cognitive function declines  Eventually pt / wife will ne to consider trach/ PEG  Vs palliative approach though clearly not at this point yet.       Christinia Gully, MD 04/03/2021

## 2021-04-03 NOTE — Assessment & Plan Note (Addendum)
Onset apparently after admit for falling/ fx R Hip and worsening since with only dysmotility on ST eval  - ST eval May 2022 at [facility]    Unfortunate combination of es dysmotility and dysphagia in oropharyngeal phase ? Related to cognitive decline or prior cva but nothing to offer from pulmonary perspective other than to be sure he's on max gerd diet/ meds and avoids fish oil or any oil based vitamin to avoid lipoid pna.  Would hold abx in absence of convincing evidence of bacterial pna as most of what he's likely to have will be a bland chemical pneumonitis with recurrent asp inevitable here as cognitive function declines  Eventually pt / wife will ne to consider trach/ PEG  Vs palliative approach though clearly not at this point yet.          Each maintenance medication was reviewed in detail including emphasizing most importantly the difference between maintenance and prns and under what circumstances the prns are to be triggered using an action plan format where appropriate.  Total time for H and P, chart review, counseling,  and generating customized AVS unique to this office visit / same day charting  > 60 min

## 2021-04-03 NOTE — Patient Instructions (Signed)
Change omeprazole to 40 mg Take 30- 60 min before your first and last meals of the day   Try breztri Take 2 puffs first thing in am and then another 2 puffs about 12 hours later.   - Work on inhaler technique:  relax and gently blow all the way out then take a nice smooth full deep breath back in, triggering the inhaler at same time you start breathing in.  Hold for up to 5 seconds if you can. Blow out thru nose. Rinse and gargle with water when done.  If mouth or throat bother you at all,  try brushing teeth/gums/tongue with arm and hammer toothpaste/ make a slurry and gargle and spit out.  - if not better stop it and do not restart trelegy   GERD (REFLUX)  is an extremely common cause of respiratory symptoms just like yours , many times with no obvious heartburn at all.    It can be treated with medication, but also with lifestyle changes including elevation of the head of your bed (ideally with 6 -8inch blocks under the headboard of your bed),  Smoking cessation, avoidance of late meals, excessive alcohol, and avoid fatty foods, chocolate, peppermint, colas, red wine, and acidic juices such as orange juice.  NO MINT OR MENTHOL PRODUCTS SO NO COUGH DROPS  USE SUGARLESS CANDY INSTEAD (Jolley ranchers or Stover's or Life Savers) or even ice chips will also do - the key is to swallow to prevent all throat clearing. NO OIL BASED VITAMINS - use powdered substitutes.  Avoid fish oil when coughing.   Please schedule a follow up office visit in 4 weeks, sooner if needed

## 2021-04-03 NOTE — Assessment & Plan Note (Signed)
Quit smoking 1992 - 04/03/2021  After extensive coaching inhaler device,  effectiveness =    75% > try breztri Take 2 puffs first thing in am and then another 2 puffs about 12 hours later.    When respiratory symptoms begin or become refractory well after a patient reports complete smoking cessation,  Especially when this wasn't the case while they were smoking, a red flag is raised based on the work of Dr Kris Mouton which states:  if you quit smoking when your best day FEV1 is still well preserved it is highly unlikely you will progress to severe disease.  That is to say, once the smoking stops,  the symptoms should not suddenly erupt or markedly worsen.  If so, the differential diagnosis should include  obesity/deconditioning,  LPR/Reflux/Aspiration syndromes,  occult CHF, or  especially side effect of medications commonly used in this population.     Nevertheless he may have mild AB component aggravated by LPR or aspiration so worth replacing trelegy with breztri as my experience has been that upper airway wheeze/cough sometimes gets worse with the former though I explained to pt / wife also see this with breztri and if not better p samples then no need to fill the rx for breztri.

## 2021-04-13 DIAGNOSIS — E039 Hypothyroidism, unspecified: Secondary | ICD-10-CM | POA: Diagnosis not present

## 2021-04-13 DIAGNOSIS — E7849 Other hyperlipidemia: Secondary | ICD-10-CM | POA: Diagnosis not present

## 2021-04-13 DIAGNOSIS — R5381 Other malaise: Secondary | ICD-10-CM | POA: Diagnosis not present

## 2021-04-13 DIAGNOSIS — E782 Mixed hyperlipidemia: Secondary | ICD-10-CM | POA: Diagnosis not present

## 2021-04-13 DIAGNOSIS — K2100 Gastro-esophageal reflux disease with esophagitis, without bleeding: Secondary | ICD-10-CM | POA: Diagnosis not present

## 2021-04-13 DIAGNOSIS — I10 Essential (primary) hypertension: Secondary | ICD-10-CM | POA: Diagnosis not present

## 2021-04-13 DIAGNOSIS — E875 Hyperkalemia: Secondary | ICD-10-CM | POA: Diagnosis not present

## 2021-04-20 DIAGNOSIS — E871 Hypo-osmolality and hyponatremia: Secondary | ICD-10-CM | POA: Diagnosis not present

## 2021-04-20 DIAGNOSIS — D500 Iron deficiency anemia secondary to blood loss (chronic): Secondary | ICD-10-CM | POA: Diagnosis not present

## 2021-04-20 DIAGNOSIS — E039 Hypothyroidism, unspecified: Secondary | ICD-10-CM | POA: Diagnosis not present

## 2021-04-20 DIAGNOSIS — G301 Alzheimer's disease with late onset: Secondary | ICD-10-CM | POA: Diagnosis not present

## 2021-04-20 DIAGNOSIS — K2100 Gastro-esophageal reflux disease with esophagitis, without bleeding: Secondary | ICD-10-CM | POA: Diagnosis not present

## 2021-04-20 DIAGNOSIS — I10 Essential (primary) hypertension: Secondary | ICD-10-CM | POA: Diagnosis not present

## 2021-04-20 DIAGNOSIS — N401 Enlarged prostate with lower urinary tract symptoms: Secondary | ICD-10-CM | POA: Diagnosis not present

## 2021-04-20 DIAGNOSIS — F331 Major depressive disorder, recurrent, moderate: Secondary | ICD-10-CM | POA: Diagnosis not present

## 2021-05-03 ENCOUNTER — Ambulatory Visit: Payer: PPO | Admitting: Internal Medicine

## 2021-08-02 DIAGNOSIS — K2100 Gastro-esophageal reflux disease with esophagitis, without bleeding: Secondary | ICD-10-CM | POA: Diagnosis not present

## 2021-08-02 DIAGNOSIS — I10 Essential (primary) hypertension: Secondary | ICD-10-CM | POA: Diagnosis not present

## 2021-08-02 DIAGNOSIS — N401 Enlarged prostate with lower urinary tract symptoms: Secondary | ICD-10-CM | POA: Diagnosis not present

## 2021-08-02 DIAGNOSIS — G301 Alzheimer's disease with late onset: Secondary | ICD-10-CM | POA: Diagnosis not present

## 2021-08-02 DIAGNOSIS — Z1212 Encounter for screening for malignant neoplasm of rectum: Secondary | ICD-10-CM | POA: Diagnosis not present

## 2021-08-02 DIAGNOSIS — J301 Allergic rhinitis due to pollen: Secondary | ICD-10-CM | POA: Diagnosis not present

## 2021-08-02 DIAGNOSIS — D500 Iron deficiency anemia secondary to blood loss (chronic): Secondary | ICD-10-CM | POA: Diagnosis not present

## 2021-08-02 DIAGNOSIS — F331 Major depressive disorder, recurrent, moderate: Secondary | ICD-10-CM | POA: Diagnosis not present

## 2021-08-02 DIAGNOSIS — E871 Hypo-osmolality and hyponatremia: Secondary | ICD-10-CM | POA: Diagnosis not present

## 2021-08-02 DIAGNOSIS — E039 Hypothyroidism, unspecified: Secondary | ICD-10-CM | POA: Diagnosis not present

## 2021-08-02 DIAGNOSIS — Z23 Encounter for immunization: Secondary | ICD-10-CM | POA: Diagnosis not present

## 2021-08-21 DIAGNOSIS — H903 Sensorineural hearing loss, bilateral: Secondary | ICD-10-CM | POA: Diagnosis not present

## 2021-09-11 DIAGNOSIS — D692 Other nonthrombocytopenic purpura: Secondary | ICD-10-CM | POA: Diagnosis not present

## 2021-09-11 DIAGNOSIS — J449 Chronic obstructive pulmonary disease, unspecified: Secondary | ICD-10-CM | POA: Diagnosis not present

## 2021-09-11 DIAGNOSIS — R531 Weakness: Secondary | ICD-10-CM | POA: Diagnosis not present

## 2021-09-11 DIAGNOSIS — F0280 Dementia in other diseases classified elsewhere without behavioral disturbance: Secondary | ICD-10-CM | POA: Diagnosis not present

## 2021-09-11 DIAGNOSIS — Z515 Encounter for palliative care: Secondary | ICD-10-CM | POA: Diagnosis not present

## 2021-09-11 DIAGNOSIS — G309 Alzheimer's disease, unspecified: Secondary | ICD-10-CM | POA: Diagnosis not present

## 2021-09-20 DIAGNOSIS — N390 Urinary tract infection, site not specified: Secondary | ICD-10-CM | POA: Diagnosis not present

## 2021-09-20 DIAGNOSIS — N644 Mastodynia: Secondary | ICD-10-CM | POA: Diagnosis not present

## 2021-09-20 DIAGNOSIS — D692 Other nonthrombocytopenic purpura: Secondary | ICD-10-CM | POA: Diagnosis not present

## 2021-09-20 DIAGNOSIS — N63 Unspecified lump in unspecified breast: Secondary | ICD-10-CM | POA: Diagnosis not present

## 2021-09-20 DIAGNOSIS — L89611 Pressure ulcer of right heel, stage 1: Secondary | ICD-10-CM | POA: Diagnosis not present

## 2021-09-26 DIAGNOSIS — Z87440 Personal history of urinary (tract) infections: Secondary | ICD-10-CM | POA: Diagnosis not present

## 2021-09-26 DIAGNOSIS — L89611 Pressure ulcer of right heel, stage 1: Secondary | ICD-10-CM | POA: Diagnosis not present

## 2021-10-02 DIAGNOSIS — R531 Weakness: Secondary | ICD-10-CM | POA: Diagnosis not present

## 2021-10-02 DIAGNOSIS — L89311 Pressure ulcer of right buttock, stage 1: Secondary | ICD-10-CM | POA: Diagnosis not present

## 2021-10-02 DIAGNOSIS — F0280 Dementia in other diseases classified elsewhere without behavioral disturbance: Secondary | ICD-10-CM | POA: Diagnosis not present

## 2021-10-02 DIAGNOSIS — G629 Polyneuropathy, unspecified: Secondary | ICD-10-CM | POA: Diagnosis not present

## 2021-10-02 DIAGNOSIS — G309 Alzheimer's disease, unspecified: Secondary | ICD-10-CM | POA: Diagnosis not present

## 2021-10-02 DIAGNOSIS — L89611 Pressure ulcer of right heel, stage 1: Secondary | ICD-10-CM | POA: Diagnosis not present

## 2021-10-02 DIAGNOSIS — Z515 Encounter for palliative care: Secondary | ICD-10-CM | POA: Diagnosis not present

## 2021-11-02 DIAGNOSIS — N401 Enlarged prostate with lower urinary tract symptoms: Secondary | ICD-10-CM | POA: Diagnosis not present

## 2021-11-02 DIAGNOSIS — D500 Iron deficiency anemia secondary to blood loss (chronic): Secondary | ICD-10-CM | POA: Diagnosis not present

## 2021-11-02 DIAGNOSIS — I10 Essential (primary) hypertension: Secondary | ICD-10-CM | POA: Diagnosis not present

## 2021-11-02 DIAGNOSIS — K2100 Gastro-esophageal reflux disease with esophagitis, without bleeding: Secondary | ICD-10-CM | POA: Diagnosis not present

## 2021-11-02 DIAGNOSIS — E871 Hypo-osmolality and hyponatremia: Secondary | ICD-10-CM | POA: Diagnosis not present

## 2021-11-02 DIAGNOSIS — F331 Major depressive disorder, recurrent, moderate: Secondary | ICD-10-CM | POA: Diagnosis not present

## 2021-11-02 DIAGNOSIS — G301 Alzheimer's disease with late onset: Secondary | ICD-10-CM | POA: Diagnosis not present

## 2021-11-02 DIAGNOSIS — E039 Hypothyroidism, unspecified: Secondary | ICD-10-CM | POA: Diagnosis not present

## 2021-12-01 IMAGING — DX DG CHEST 1V PORT
1 series · 1 of 1 positions shown · non-contrast
Comparison: None.

CLINICAL DATA: Pre-op for hip fracture surgery. Denies any chest
complaints today. H/o COPD, HTN. Former smoker.

EXAM:
PORTABLE CHEST 1 VIEW

[chest ap]
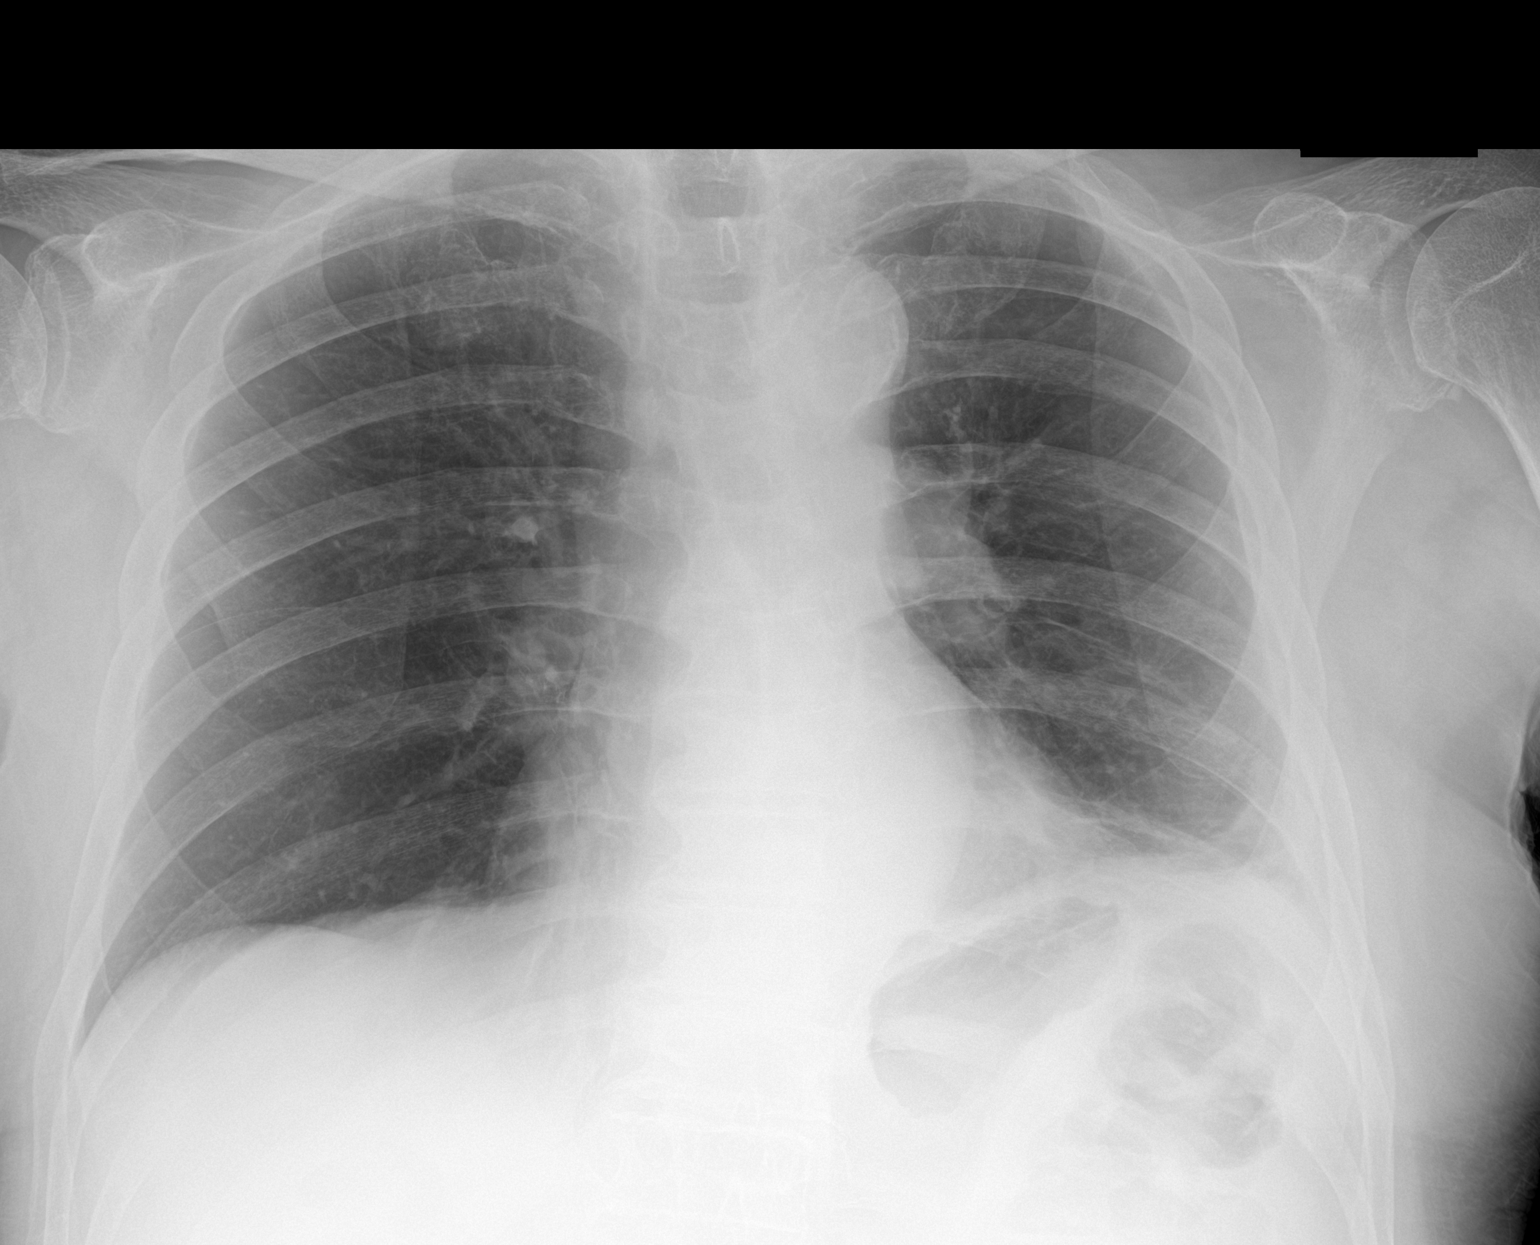

[1 of 1 positions shown; findings below may reference images not displayed]

FINDINGS: Normal cardiac silhouette. Mild LEFT basilar atelectasis and
potential small LEFT effusion. RIGHT lung clear. No pneumothorax. No
acute osseous abnormality.
IMPRESSION: LEFT basilar atelectasis and small effusion.

## 2021-12-01 IMAGING — DX DG FEMUR 2+V*R*
5 series · 5 of 5 positions shown · non-contrast
Comparison: 05/20/2020

CLINICAL DATA: Known right hip fracture

EXAM:
DG HIP (WITH OR WITHOUT PELVIS) 2-3V RIGHT; RIGHT FEMUR 2 VIEWS

[femur ap]
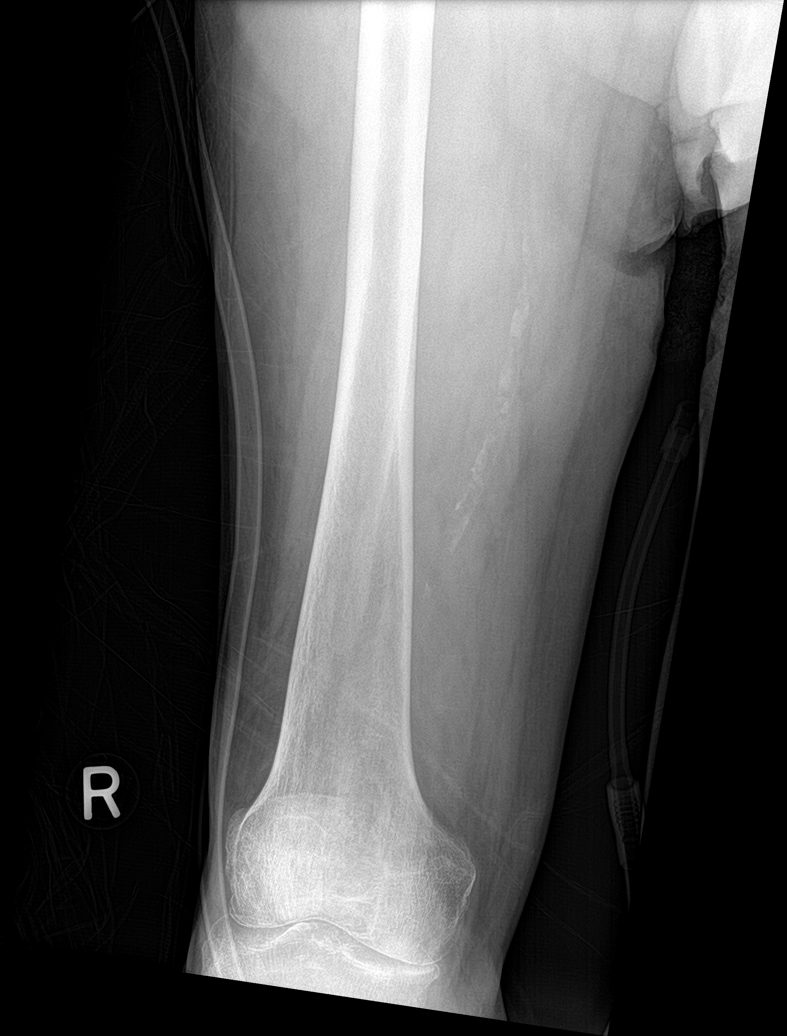

[hip ap]
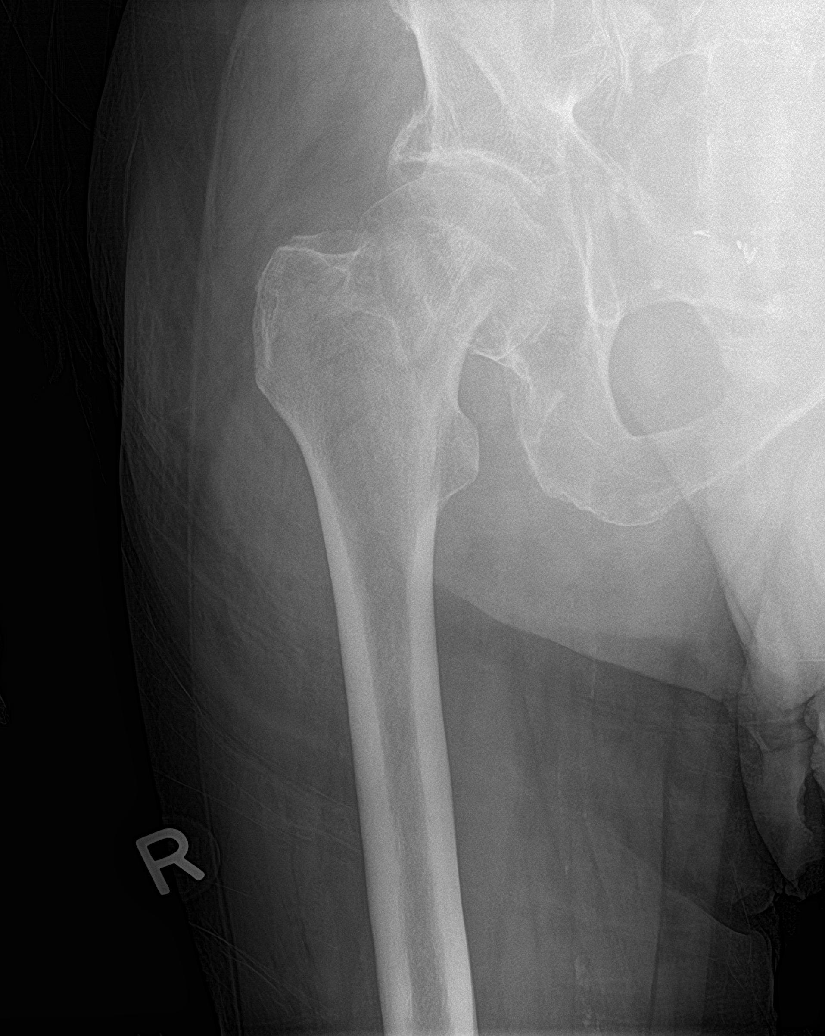

[femur lat (1 of 2)]
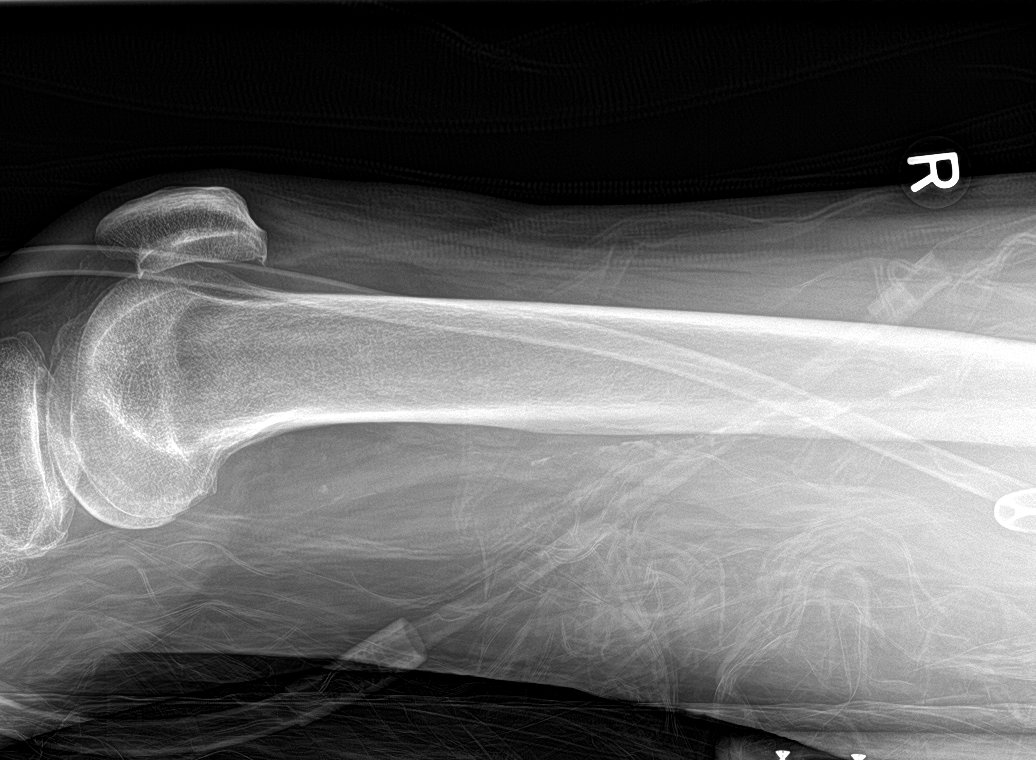

[femur lat (2 of 2)]
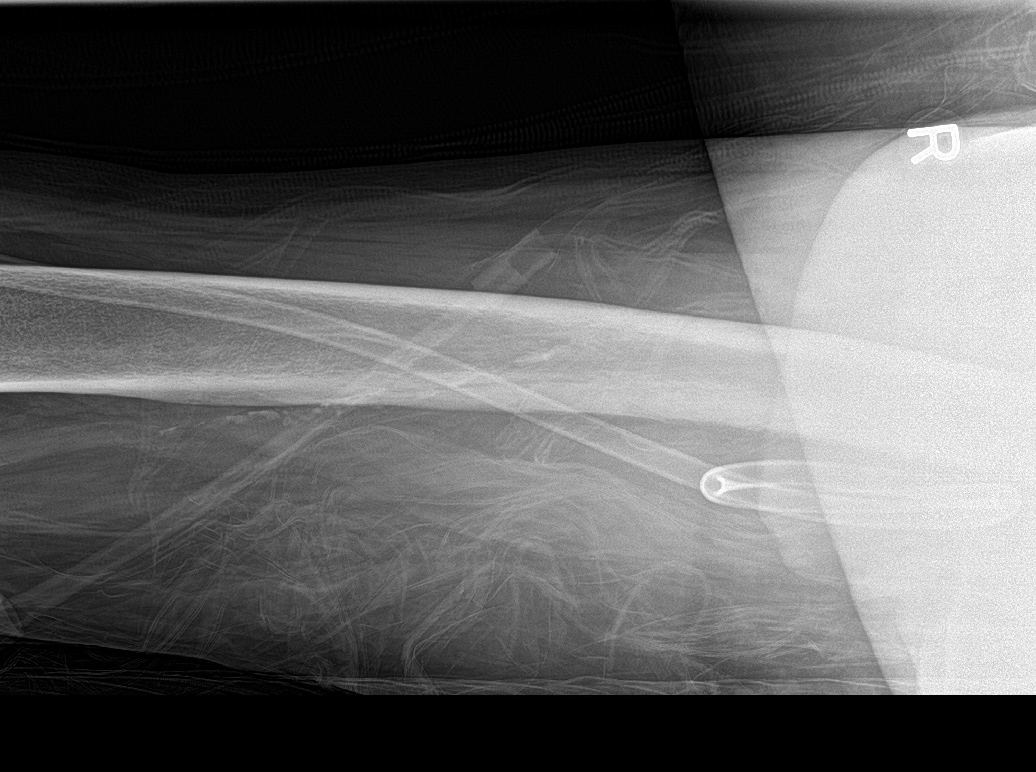

[hip x-table]
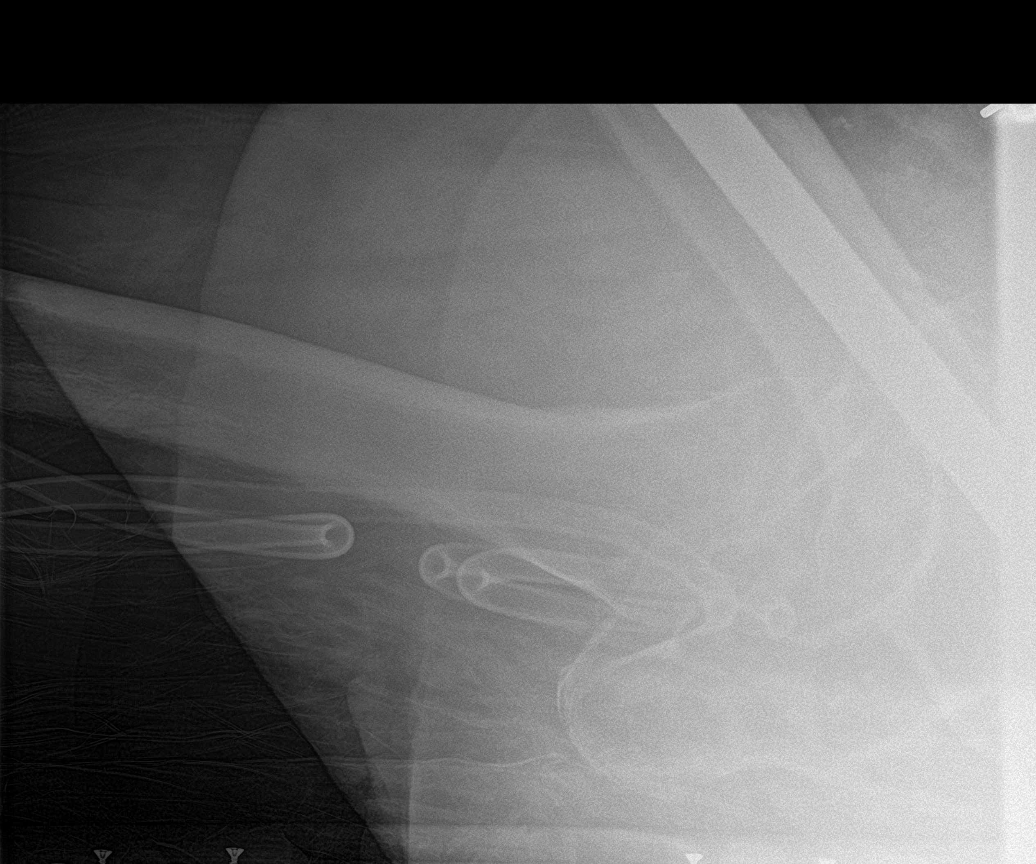

[5 of 5 positions shown; findings below may reference images not displayed]

FINDINGS: Redemonstrated impacted subcapital fracture of the right femoral
neck. There are small avulsion fracture fragments or vascular
calcifications adjacent to the right lesser trochanter. No fracture
or dislocation of the distal right femur. No displaced fracture or
dislocation of the pelvis seen in single frontal view. Status post
left hip total arthroplasty.
IMPRESSION: 1. Redemonstrated impacted subcapital fracture of the right femoral
neck. There are small avulsion fracture fragments or vascular
calcifications adjacent to the right lesser trochanter without other
obvious fracture.

2.  The distal right femur is intact.

## 2022-06-05 DIAGNOSIS — I10 Essential (primary) hypertension: Secondary | ICD-10-CM | POA: Diagnosis not present

## 2022-06-05 DIAGNOSIS — I6523 Occlusion and stenosis of bilateral carotid arteries: Secondary | ICD-10-CM | POA: Diagnosis not present

## 2022-06-05 DIAGNOSIS — G4700 Insomnia, unspecified: Secondary | ICD-10-CM | POA: Diagnosis not present

## 2022-06-05 DIAGNOSIS — F03918 Unspecified dementia, unspecified severity, with other behavioral disturbance: Secondary | ICD-10-CM | POA: Diagnosis not present

## 2022-06-05 DIAGNOSIS — K219 Gastro-esophageal reflux disease without esophagitis: Secondary | ICD-10-CM | POA: Diagnosis not present

## 2022-06-05 DIAGNOSIS — E663 Overweight: Secondary | ICD-10-CM | POA: Diagnosis not present

## 2022-06-05 DIAGNOSIS — E785 Hyperlipidemia, unspecified: Secondary | ICD-10-CM | POA: Diagnosis not present

## 2022-06-05 DIAGNOSIS — R32 Unspecified urinary incontinence: Secondary | ICD-10-CM | POA: Diagnosis not present

## 2022-06-05 DIAGNOSIS — G629 Polyneuropathy, unspecified: Secondary | ICD-10-CM | POA: Diagnosis not present

## 2022-06-05 DIAGNOSIS — J449 Chronic obstructive pulmonary disease, unspecified: Secondary | ICD-10-CM | POA: Diagnosis not present

## 2022-06-05 DIAGNOSIS — F0393 Unspecified dementia, unspecified severity, with mood disturbance: Secondary | ICD-10-CM | POA: Diagnosis not present

## 2022-06-05 DIAGNOSIS — F411 Generalized anxiety disorder: Secondary | ICD-10-CM | POA: Diagnosis not present

## 2022-09-13 DEATH — deceased
